# Patient Record
Sex: Female | Born: 1945 | Race: White | Hispanic: No | Smoking: Current every day smoker
Health system: Southern US, Community
[De-identification: ages and names within clinical notes are randomized; demographics above are authoritative.]

## PROBLEM LIST (undated history)

## (undated) DIAGNOSIS — R252 Cramp and spasm: Secondary | ICD-10-CM

## (undated) DIAGNOSIS — F32A Depression, unspecified: Secondary | ICD-10-CM

## (undated) DIAGNOSIS — E876 Hypokalemia: Secondary | ICD-10-CM

## (undated) DIAGNOSIS — E785 Hyperlipidemia, unspecified: Secondary | ICD-10-CM

## (undated) DIAGNOSIS — J449 Chronic obstructive pulmonary disease, unspecified: Secondary | ICD-10-CM

## (undated) DIAGNOSIS — L409 Psoriasis, unspecified: Secondary | ICD-10-CM

## (undated) DIAGNOSIS — F329 Major depressive disorder, single episode, unspecified: Secondary | ICD-10-CM

## (undated) DIAGNOSIS — I10 Essential (primary) hypertension: Secondary | ICD-10-CM

## (undated) DIAGNOSIS — K219 Gastro-esophageal reflux disease without esophagitis: Secondary | ICD-10-CM

## (undated) DIAGNOSIS — Z950 Presence of cardiac pacemaker: Secondary | ICD-10-CM

## (undated) HISTORY — DX: Gastro-esophageal reflux disease without esophagitis: K21.9

## (undated) HISTORY — PX: CATARACT EXTRACTION, BILATERAL: SHX1313

## (undated) HISTORY — DX: Cramp and spasm: R25.2

## (undated) HISTORY — PX: ABDOMINAL HYSTERECTOMY: SHX81

## (undated) HISTORY — PX: MASTOIDECTOMY: SHX711

## (undated) HISTORY — DX: Hyperlipidemia, unspecified: E78.5

## (undated) HISTORY — PX: BACK SURGERY: SHX140

## (undated) HISTORY — DX: Essential (primary) hypertension: I10

## (undated) HISTORY — DX: Hypokalemia: E87.6

## (undated) HISTORY — PX: APPENDECTOMY: SHX54

## (undated) HISTORY — PX: CHOLECYSTECTOMY: SHX55

## (undated) HISTORY — DX: Psoriasis, unspecified: L40.9

## (undated) HISTORY — DX: Chronic obstructive pulmonary disease, unspecified: J44.9

## (undated) HISTORY — PX: MASTECTOMY: SHX3

## (undated) HISTORY — DX: Depression, unspecified: F32.A

## (undated) HISTORY — DX: Major depressive disorder, single episode, unspecified: F32.9

## (undated) HISTORY — PX: OTHER SURGICAL HISTORY: SHX169

## (undated) HISTORY — PX: PARTIAL COLECTOMY: SHX5273

---

## 2014-05-06 DIAGNOSIS — J45909 Unspecified asthma, uncomplicated: Secondary | ICD-10-CM | POA: Insufficient documentation

## 2014-05-06 HISTORY — DX: Unspecified asthma, uncomplicated: J45.909

## 2014-09-20 DIAGNOSIS — D649 Anemia, unspecified: Secondary | ICD-10-CM

## 2014-09-20 DIAGNOSIS — E559 Vitamin D deficiency, unspecified: Secondary | ICD-10-CM | POA: Insufficient documentation

## 2014-09-20 DIAGNOSIS — E7800 Pure hypercholesterolemia, unspecified: Secondary | ICD-10-CM

## 2014-09-20 DIAGNOSIS — M1990 Unspecified osteoarthritis, unspecified site: Secondary | ICD-10-CM

## 2014-09-20 HISTORY — DX: Anemia, unspecified: D64.9

## 2014-09-20 HISTORY — DX: Vitamin D deficiency, unspecified: E55.9

## 2014-09-20 HISTORY — DX: Unspecified osteoarthritis, unspecified site: M19.90

## 2014-09-20 HISTORY — DX: Pure hypercholesterolemia, unspecified: E78.00

## 2016-10-25 DIAGNOSIS — H02831 Dermatochalasis of right upper eyelid: Secondary | ICD-10-CM | POA: Diagnosis not present

## 2016-10-25 DIAGNOSIS — H02834 Dermatochalasis of left upper eyelid: Secondary | ICD-10-CM | POA: Diagnosis not present

## 2016-12-26 DIAGNOSIS — Z1329 Encounter for screening for other suspected endocrine disorder: Secondary | ICD-10-CM | POA: Diagnosis not present

## 2016-12-26 DIAGNOSIS — E782 Mixed hyperlipidemia: Secondary | ICD-10-CM | POA: Diagnosis not present

## 2016-12-26 DIAGNOSIS — I10 Essential (primary) hypertension: Secondary | ICD-10-CM | POA: Diagnosis not present

## 2016-12-26 DIAGNOSIS — Z79899 Other long term (current) drug therapy: Secondary | ICD-10-CM | POA: Diagnosis not present

## 2016-12-31 DIAGNOSIS — H5340 Unspecified visual field defects: Secondary | ICD-10-CM | POA: Diagnosis not present

## 2016-12-31 DIAGNOSIS — H02831 Dermatochalasis of right upper eyelid: Secondary | ICD-10-CM | POA: Diagnosis not present

## 2016-12-31 DIAGNOSIS — H02834 Dermatochalasis of left upper eyelid: Secondary | ICD-10-CM | POA: Diagnosis not present

## 2017-01-02 DIAGNOSIS — F329 Major depressive disorder, single episode, unspecified: Secondary | ICD-10-CM | POA: Diagnosis not present

## 2017-01-02 DIAGNOSIS — Z87891 Personal history of nicotine dependence: Secondary | ICD-10-CM | POA: Diagnosis not present

## 2017-01-02 DIAGNOSIS — Z0001 Encounter for general adult medical examination with abnormal findings: Secondary | ICD-10-CM | POA: Diagnosis not present

## 2017-01-02 DIAGNOSIS — K219 Gastro-esophageal reflux disease without esophagitis: Secondary | ICD-10-CM | POA: Diagnosis not present

## 2017-01-02 DIAGNOSIS — I10 Essential (primary) hypertension: Secondary | ICD-10-CM | POA: Diagnosis not present

## 2017-01-02 DIAGNOSIS — E782 Mixed hyperlipidemia: Secondary | ICD-10-CM | POA: Diagnosis not present

## 2017-01-07 DIAGNOSIS — J439 Emphysema, unspecified: Secondary | ICD-10-CM | POA: Diagnosis not present

## 2017-01-07 DIAGNOSIS — Z87891 Personal history of nicotine dependence: Secondary | ICD-10-CM | POA: Diagnosis not present

## 2017-01-07 DIAGNOSIS — R911 Solitary pulmonary nodule: Secondary | ICD-10-CM | POA: Diagnosis not present

## 2017-02-07 DIAGNOSIS — M1711 Unilateral primary osteoarthritis, right knee: Secondary | ICD-10-CM | POA: Diagnosis not present

## 2017-03-11 DIAGNOSIS — M47896 Other spondylosis, lumbar region: Secondary | ICD-10-CM | POA: Diagnosis not present

## 2017-03-11 DIAGNOSIS — M545 Low back pain: Secondary | ICD-10-CM | POA: Diagnosis not present

## 2017-03-18 DIAGNOSIS — M545 Low back pain: Secondary | ICD-10-CM | POA: Diagnosis not present

## 2017-03-18 DIAGNOSIS — M47896 Other spondylosis, lumbar region: Secondary | ICD-10-CM | POA: Diagnosis not present

## 2017-04-01 DIAGNOSIS — M545 Low back pain: Secondary | ICD-10-CM | POA: Diagnosis not present

## 2017-04-01 DIAGNOSIS — M47896 Other spondylosis, lumbar region: Secondary | ICD-10-CM | POA: Diagnosis not present

## 2017-04-03 DIAGNOSIS — M47896 Other spondylosis, lumbar region: Secondary | ICD-10-CM | POA: Diagnosis not present

## 2017-04-03 DIAGNOSIS — M545 Low back pain: Secondary | ICD-10-CM | POA: Diagnosis not present

## 2017-04-08 DIAGNOSIS — M545 Low back pain: Secondary | ICD-10-CM | POA: Diagnosis not present

## 2017-04-08 DIAGNOSIS — M47896 Other spondylosis, lumbar region: Secondary | ICD-10-CM | POA: Diagnosis not present

## 2017-04-11 DIAGNOSIS — M47896 Other spondylosis, lumbar region: Secondary | ICD-10-CM | POA: Diagnosis not present

## 2017-04-11 DIAGNOSIS — M545 Low back pain: Secondary | ICD-10-CM | POA: Diagnosis not present

## 2017-04-29 DIAGNOSIS — E782 Mixed hyperlipidemia: Secondary | ICD-10-CM | POA: Diagnosis not present

## 2017-04-29 DIAGNOSIS — M4726 Other spondylosis with radiculopathy, lumbar region: Secondary | ICD-10-CM | POA: Diagnosis not present

## 2017-04-29 DIAGNOSIS — Z79899 Other long term (current) drug therapy: Secondary | ICD-10-CM | POA: Diagnosis not present

## 2017-04-29 DIAGNOSIS — I10 Essential (primary) hypertension: Secondary | ICD-10-CM | POA: Diagnosis not present

## 2017-05-05 DIAGNOSIS — M4726 Other spondylosis with radiculopathy, lumbar region: Secondary | ICD-10-CM | POA: Diagnosis not present

## 2017-05-06 DIAGNOSIS — E782 Mixed hyperlipidemia: Secondary | ICD-10-CM | POA: Diagnosis not present

## 2017-05-06 DIAGNOSIS — J449 Chronic obstructive pulmonary disease, unspecified: Secondary | ICD-10-CM | POA: Diagnosis not present

## 2017-05-06 DIAGNOSIS — I10 Essential (primary) hypertension: Secondary | ICD-10-CM | POA: Diagnosis not present

## 2017-05-06 DIAGNOSIS — F329 Major depressive disorder, single episode, unspecified: Secondary | ICD-10-CM | POA: Diagnosis not present

## 2017-05-06 DIAGNOSIS — L409 Psoriasis, unspecified: Secondary | ICD-10-CM | POA: Diagnosis not present

## 2017-05-08 DIAGNOSIS — M4726 Other spondylosis with radiculopathy, lumbar region: Secondary | ICD-10-CM | POA: Diagnosis not present

## 2017-05-08 DIAGNOSIS — M4696 Unspecified inflammatory spondylopathy, lumbar region: Secondary | ICD-10-CM | POA: Diagnosis not present

## 2017-05-22 DIAGNOSIS — M48061 Spinal stenosis, lumbar region without neurogenic claudication: Secondary | ICD-10-CM | POA: Diagnosis not present

## 2017-06-17 DIAGNOSIS — M48062 Spinal stenosis, lumbar region with neurogenic claudication: Secondary | ICD-10-CM | POA: Diagnosis not present

## 2017-09-08 DIAGNOSIS — I10 Essential (primary) hypertension: Secondary | ICD-10-CM | POA: Diagnosis not present

## 2017-09-08 DIAGNOSIS — Z79899 Other long term (current) drug therapy: Secondary | ICD-10-CM | POA: Diagnosis not present

## 2017-09-08 DIAGNOSIS — E782 Mixed hyperlipidemia: Secondary | ICD-10-CM | POA: Diagnosis not present

## 2017-09-18 DIAGNOSIS — L409 Psoriasis, unspecified: Secondary | ICD-10-CM | POA: Diagnosis not present

## 2017-09-18 DIAGNOSIS — K219 Gastro-esophageal reflux disease without esophagitis: Secondary | ICD-10-CM | POA: Diagnosis not present

## 2017-09-18 DIAGNOSIS — E782 Mixed hyperlipidemia: Secondary | ICD-10-CM | POA: Diagnosis not present

## 2017-09-18 DIAGNOSIS — F329 Major depressive disorder, single episode, unspecified: Secondary | ICD-10-CM | POA: Diagnosis not present

## 2017-09-18 DIAGNOSIS — Z634 Disappearance and death of family member: Secondary | ICD-10-CM | POA: Diagnosis not present

## 2017-11-24 DIAGNOSIS — M545 Low back pain: Secondary | ICD-10-CM | POA: Diagnosis not present

## 2017-12-10 DIAGNOSIS — L400 Psoriasis vulgaris: Secondary | ICD-10-CM | POA: Diagnosis not present

## 2017-12-10 DIAGNOSIS — L814 Other melanin hyperpigmentation: Secondary | ICD-10-CM | POA: Diagnosis not present

## 2018-01-07 DIAGNOSIS — J441 Chronic obstructive pulmonary disease with (acute) exacerbation: Secondary | ICD-10-CM | POA: Diagnosis not present

## 2018-05-18 DIAGNOSIS — Z23 Encounter for immunization: Secondary | ICD-10-CM | POA: Diagnosis not present

## 2018-05-18 DIAGNOSIS — F329 Major depressive disorder, single episode, unspecified: Secondary | ICD-10-CM | POA: Diagnosis not present

## 2018-05-18 DIAGNOSIS — G4700 Insomnia, unspecified: Secondary | ICD-10-CM | POA: Diagnosis not present

## 2018-05-18 DIAGNOSIS — K219 Gastro-esophageal reflux disease without esophagitis: Secondary | ICD-10-CM | POA: Diagnosis not present

## 2018-05-18 DIAGNOSIS — E785 Hyperlipidemia, unspecified: Secondary | ICD-10-CM | POA: Diagnosis not present

## 2018-06-18 DIAGNOSIS — G4700 Insomnia, unspecified: Secondary | ICD-10-CM | POA: Diagnosis not present

## 2018-06-18 DIAGNOSIS — F329 Major depressive disorder, single episode, unspecified: Secondary | ICD-10-CM | POA: Diagnosis not present

## 2018-06-18 DIAGNOSIS — R609 Edema, unspecified: Secondary | ICD-10-CM | POA: Diagnosis not present

## 2018-06-18 DIAGNOSIS — J449 Chronic obstructive pulmonary disease, unspecified: Secondary | ICD-10-CM | POA: Diagnosis not present

## 2018-06-24 DIAGNOSIS — H2703 Aphakia, bilateral: Secondary | ICD-10-CM | POA: Diagnosis not present

## 2018-06-29 DIAGNOSIS — I10 Essential (primary) hypertension: Secondary | ICD-10-CM | POA: Diagnosis not present

## 2018-06-29 DIAGNOSIS — Z6823 Body mass index (BMI) 23.0-23.9, adult: Secondary | ICD-10-CM | POA: Diagnosis not present

## 2018-06-29 DIAGNOSIS — K219 Gastro-esophageal reflux disease without esophagitis: Secondary | ICD-10-CM | POA: Diagnosis not present

## 2018-07-30 DIAGNOSIS — Z23 Encounter for immunization: Secondary | ICD-10-CM | POA: Diagnosis not present

## 2018-07-30 DIAGNOSIS — Z0000 Encounter for general adult medical examination without abnormal findings: Secondary | ICD-10-CM | POA: Diagnosis not present

## 2018-07-30 DIAGNOSIS — Z139 Encounter for screening, unspecified: Secondary | ICD-10-CM | POA: Diagnosis not present

## 2018-07-30 DIAGNOSIS — Z1339 Encounter for screening examination for other mental health and behavioral disorders: Secondary | ICD-10-CM | POA: Diagnosis not present

## 2018-07-30 DIAGNOSIS — I10 Essential (primary) hypertension: Secondary | ICD-10-CM | POA: Diagnosis not present

## 2018-07-30 DIAGNOSIS — K219 Gastro-esophageal reflux disease without esophagitis: Secondary | ICD-10-CM | POA: Diagnosis not present

## 2018-07-30 DIAGNOSIS — Z9181 History of falling: Secondary | ICD-10-CM | POA: Diagnosis not present

## 2018-07-30 DIAGNOSIS — E785 Hyperlipidemia, unspecified: Secondary | ICD-10-CM | POA: Diagnosis not present

## 2018-07-30 DIAGNOSIS — F329 Major depressive disorder, single episode, unspecified: Secondary | ICD-10-CM | POA: Diagnosis not present

## 2018-07-30 DIAGNOSIS — Z1331 Encounter for screening for depression: Secondary | ICD-10-CM | POA: Diagnosis not present

## 2018-07-30 DIAGNOSIS — Z79899 Other long term (current) drug therapy: Secondary | ICD-10-CM | POA: Diagnosis not present

## 2018-08-11 DIAGNOSIS — H9190 Unspecified hearing loss, unspecified ear: Secondary | ICD-10-CM | POA: Diagnosis not present

## 2018-08-11 DIAGNOSIS — Z6823 Body mass index (BMI) 23.0-23.9, adult: Secondary | ICD-10-CM | POA: Diagnosis not present

## 2018-08-11 DIAGNOSIS — F329 Major depressive disorder, single episode, unspecified: Secondary | ICD-10-CM | POA: Diagnosis not present

## 2018-08-11 DIAGNOSIS — R251 Tremor, unspecified: Secondary | ICD-10-CM | POA: Diagnosis not present

## 2018-08-18 ENCOUNTER — Encounter: Payer: Self-pay | Admitting: Neurology

## 2018-08-18 ENCOUNTER — Ambulatory Visit (INDEPENDENT_AMBULATORY_CARE_PROVIDER_SITE_OTHER): Payer: Medicare Other | Admitting: Neurology

## 2018-08-18 VITALS — BP 159/73 | HR 62 | Ht 67.0 in | Wt 152.0 lb

## 2018-08-18 DIAGNOSIS — F439 Reaction to severe stress, unspecified: Secondary | ICD-10-CM

## 2018-08-18 DIAGNOSIS — G250 Essential tremor: Secondary | ICD-10-CM

## 2018-08-18 NOTE — Progress Notes (Signed)
Subjective:    Patient ID: Tina George is a 72 y.o. female.  HPI     Star Age, MD, PhD [facility] [address] [place], [place] [address]  Dear Alvis Lemmings,   I saw your patient, Tina George, upon your kind request in my neurologic clinic today for initial consultation of her tremor. The patient is accompanied by her daughter today. As you know, Tina George is a 72 year old right-handed woman with an underlying medical history of hypertension, hyperlipidemia, reflux disease, COPD/asthma, depression, psoriasis and history of muscle cramps, who reports a bilateral hand tremors as well as head tremor of approximately 1 years duration. I reviewed your office note from 08/11/2018, which you kindly included. Of note, she is on Symbicort. She is also on albuterol. She continues to smoke, about  ppd. She has been on Lexapro which was recently changed to Paxil. She has been on Paxil for about a week. Of note, she takes gabapentin at night for sleep. She does report a family history of tremor in one of her sisters and also maternal grandmother. She is the youngest of 65 total siblings, all of her siblings are much older than her, the sibling next to her is 55 years older, oldest sibling was 82 years older. Another sister has end-stage cancer. She has had a lot of stress. She lost her husband about a year ago and moved away from her [facility] home about 6 months ago to be closer to her children. She has her own home here, she lives alone. Her daughter reports that she has not been taking good care of herself and that she does not eat very well, does not drink fluids very well, she does not rest well. Patient endorses a lot of stress and anxiety and does become tearful during our appointment today. She does not utilize alcohol, drinks caffeine in the form of coffee, about 2 cups per day and soda, maybe 1 per day on average. She feels that her tremor has become worse.  She has a history of asthma which has been fairly brittle as I understand. Of note, she has had issues with her balance, she feels off-balance at times, has had some hearing loss and is supposed to have her hearing tested.  Her Past Medical History Is Significant For: Past Medical History:  Diagnosis Date  . COPD (chronic obstructive pulmonary disease) ([place])   . Depression   . Dyslipidemia   . GERD (gastroesophageal reflux disease)   . High blood pressure   . Hypokalemia   . Muscle cramps   . Psoriasis     Her Past Surgical History Is Significant For:  Her Family History Is Significant For: Family History  Problem Relation Age of Onset  . Pancreatic cancer Mother   . Hypertension Father   . Heart disease Father   . Pancreatic cancer Sister   . Pancreatic cancer Brother     Her Social History Is Significant For: Social History   Socioeconomic History  . Marital status: Widowed    Spouse name: Not on file  . Number of children: Not on file  . Years of education: Not on file  . Highest education level: Not on file  Occupational History  . Not on file  Social Needs  . Financial resource strain: Not on file  . Food insecurity:    Worry: Not on file    Inability: Not on file  . Transportation needs:  Medical: Not on file    Non-medical: Not on file  Tobacco Use  . Smoking status: Current Every Day Smoker    Packs/day: 0.50  . Smokeless tobacco: Never Used  Substance and Sexual Activity  . Alcohol use: Never    Frequency: Never  . Drug use: Never  . Sexual activity: Not on file  Lifestyle  . Physical activity:    Days per week: Not on file    Minutes per session: Not on file  . Stress: Not on file  Relationships  . Social connections:    Talks on phone: Not on file    Gets together: Not on file    Attends religious service: Not on file    Active member of club or organization: Not on file    Attends meetings of clubs or organizations: Not on file     Relationship status: Not on file  Other Topics Concern  . Not on file  Social History Narrative  . Not on file    Her Allergies Are:  Allergies  Allergen Reactions  . Aspirin   . Codeine   . Levaquin [Levofloxacin In D5w]   :   Her Current Medications Are:  Outpatient Encounter Medications as of 08/18/2018  Medication Sig  . albuterol (PROVENTIL HFA;VENTOLIN HFA) 108 (90 Base) MCG/ACT inhaler   . amLODipine (NORVASC) 2.5 MG tablet Take 2.5 mg by mouth daily.  . budesonide-formoterol (SYMBICORT) 160-4.5 MCG/ACT inhaler Inhale 2 puffs into the lungs 2 (two) times daily.  . furosemide (LASIX) 20 MG tablet Take 20 mg by mouth daily.  Marland Kitchen gabapentin (NEURONTIN) 300 MG capsule Take 300 mg by mouth.  Marland Kitchen ibuprofen (ADVIL,MOTRIN) 200 MG tablet Take 200 mg by mouth.  . pantoprazole (PROTONIX) 40 MG tablet Take 40 mg by mouth daily.  Marland Kitchen PARoxetine (PAXIL) 20 MG tablet Take 20 mg by mouth daily.  . Potassium 99 MG TABS Take 2 tablets by mouth daily.  . ranitidine (ZANTAC) 150 MG tablet Take 150 mg by mouth 2 (two) times daily.  . rosuvastatin (CRESTOR) 10 MG tablet Take 10 mg by mouth daily.  . [DISCONTINUED] escitalopram (LEXAPRO) 20 MG tablet Take 20 mg by mouth daily.   No facility-administered encounter medications on file as of 08/18/2018.   : Review of Systems:  Out of a complete 14 point review of systems, all are reviewed and negative with the exception of these symptoms as listed below:  Review of Systems  Neurological:       Pt presents today to discuss her tremors. Pt's tremors are "all over" and have been present since last year and have gotten progressively worse. Pt is right handed.    Objective:  Neurological Exam  Physical Exam Physical Examination:   Vitals:   08/18/18 1443  BP: (!) 159/73  Pulse: 62   General Examination: The patient is a very pleasant 72 y.o. female in no acute distress. She appears well-developed and well-nourished and well groomed. Anxious  appearing, near tears at times.   HEENT: Normocephalic, atraumatic, pupils are equal, round and reactive to light and accommodation. Extraocular tracking is good without limitation to gaze excursion or nystagmus noted. Normal smooth pursuit is noted. Hearing is grossly intact. Face is symmetric with normal facial animation and normal facial sensation. Speech is clear with no dysarthria noted. There is no hypophonia. She has a fairly consistent side-to-side head tremor. She has a slight voice tremor. Airway examination shows moderate mouth dryness, adequate dental hygiene with dentures in  place. Tongue protrudes centrally and palate elevates symmetrically, no orofacial dyskinesias.  Chest: Clear to auscultation without wheezing, rhonchi or crackles noted.  Heart: S1+S2+0, regular and normal without murmurs, rubs or gallops noted.   Abdomen: Soft, non-tender and non-distended with normal bowel sounds appreciated on auscultation.  Extremities: There is trace edema in the distal lower extremities bilaterally.  Skin: Warm and dry without trophic changes noted.   Musculoskeletal: exam reveals no obvious joint deformities, tenderness or joint swelling or erythema with the exception of right knee pain noted.  Neurologically:  Mental status: The patient is awake, alert and oriented in all 4 spheres. Her immediate and remote memory, attention, language skills and fund of knowledge are appropriate. There is no evidence of aphasia, agnosia, apraxia or anomia. Speech is clear with normal prosody and enunciation. Thought process is linear. Mood is constricted and affect is blunted.  Cranial nerves II - XII are as described above under HEENT exam. In addition: shoulder shrug is normal with equal shoulder height noted. Motor exam: Normal bulk, strength and tone is noted. There is no resting tremor or rebound.  On 08/18/2018: on Archimedes spiral drawing she has moderate trembling with her left hand which is her  nondominant hand and mild trembling with the right hand. Handwriting with her right hand is tremulous, slightly difficult to read, not micrographic. She has a mild to moderate postural tremor, mild action tremor in both upper extremities, no lower extremity tremor. Romberg is not tested due to safety concerns. Reflexes are 2+ throughout. Fine motor skills and coordination: intact with normal finger taps, normal hand movements, normal rapid alternating patting, normal foot taps and normal foot agility.  Cerebellar testing: No dysmetria or intention tremor on finger to nose testing. Heel to shin is unremarkable bilaterally. There is no truncal or gait ataxia.  Sensory exam: intact to light touch in the upper and lower extremities.  Gait, station and balance: She stands with mild difficulty, she has knee pain. She walks with a slight limp on the right side of slowly and cautiously. Her balance is mildly impaired. She does not have a walking aid. She has no shuffling, preserved arm swing is noted bilaterally.  Assessment and Plan:   In summary, Tina George is a very pleasant 72 y.o.-year old female with an underlying medical history of hypertension, hyperlipidemia, reflux disease, COPD/asthma, depression, psoriasis and history of muscle cramps, who presents for evaluation of her tremor. She has a head tremor and bilateral upper extremity hand tremor. 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 She has no evidence of parkinsonism and is reassured in that regard. Unfortunately, treatment options will be limited at this point. I would favor that she work on her stress reduction in anxiety management first. She had a recent change in her antidepressant medication. Her daughter reports that she has had impaired kidney function. She also is concerned about her nutrition and suboptimal hydration and patient has also not been resting well. We talked about the importance of taking  care of nutrition, hydration and resting enough as sleep deprivation, dehydration and blood sugar fluctuations can all be triggers for tremors. In addition, stress and anxiety can certainly exacerbate tremors temporarily. I would not recommend symptomatically treatment at this point. She may be a candidate for a trial of Mysoline down the Road but this would have to be done with caution as she reports balance issues. Propranolol may exacerbate her asthma and/or her depression and may  not be a good choice for her. I would like to proceed with a brain MRI without contrast, she may not have good enough kidney function to take contrast and therefore we will do a noncontrasted brain MRI, they are requesting to do this in [place]. We will call her with her MRI results. For now, I suggested as needed follow-up. I answered all their questions today and the patient and her daughter were in agreement. Thank you very much for allowing me to participate in the care of this nice patient. If I can be of any further assistance to you please do not hesitate to call me at [phone].  Sincerely,   Star Age, MD, PhD

## 2018-08-18 NOTE — Patient Instructions (Signed)
You have a hand tremor and head tremor, also a tremor of both hands. You have a family history of tremor as well. 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   I do not see any signs or symptoms of parkinson's like disease or what we call parkinsonism.   For your tremor, I would not recommend any new medication for fear of side effects (especially sleepiness) or medication interactions, especially in light of balance issues and asthma.   I will request a brain scan, called MRI and call you with the test results. We will have to schedule you for this on a separate date. This test requires authorization from your insurance, and we will take care of the insurance process. We will do this without contrast, as you have had some kidney impairment.   Please remember, that any kind of tremor may be exacerbated by anxiety, anger, nervousness, excitement, dehydration, sleep deprivation, by caffeine, and low blood sugar values or blood sugar fluctuations. Some medications can exacerbate tremors, this includes antidepressant medications.   Please work on Child psychotherapist, talk to [place] about counseling.

## 2018-08-19 ENCOUNTER — Telehealth: Payer: Self-pay | Admitting: Neurology

## 2018-08-19 NOTE — Telephone Encounter (Signed)
Medicare/[employer] supp not Josem Kaufmann pt is scheduled for 08/26/18 arrival time is 2:45 pm pt is aware. Faxed order

## 2018-08-26 ENCOUNTER — Encounter: Payer: Self-pay | Admitting: Neurology

## 2018-08-26 ENCOUNTER — Telehealth: Payer: Self-pay

## 2018-08-26 DIAGNOSIS — G250 Essential tremor: Secondary | ICD-10-CM | POA: Diagnosis not present

## 2018-08-26 DIAGNOSIS — R251 Tremor, unspecified: Secondary | ICD-10-CM | POA: Diagnosis not present

## 2018-08-26 DIAGNOSIS — I6782 Cerebral ischemia: Secondary | ICD-10-CM | POA: Diagnosis not present

## 2018-08-26 NOTE — Telephone Encounter (Signed)
Received MRI results from [facility].

## 2018-08-27 NOTE — Telephone Encounter (Signed)
Please call patient regarding the recent brain MRI: The brain scan without contrast showed a normal structure of the brain and no significant volume loss or what we call atrophy. There were changes in the deeper structures of the brain, which we call white matter changes or microvascular changes. These were reported as mild in Her case. These are tiny white spots, that occur with time and are seen in a variety of conditions, including with normal aging, chronic hypertension, chronic headaches, especially migraine HAs, chronic diabetes, chronic hyperlipidemia. These are not strokes and no mass or lesion were seen which is reassuring. Again, there were no acute findings, such as a stroke, or mass or blood products. No further action is required on this test at this time, other than re-enforcing the importance of good blood pressure control, good cholesterol control, good blood sugar control, and weight management.  She can FU prn, as discussed.

## 2018-08-27 NOTE — Telephone Encounter (Signed)
See below

## 2018-08-28 NOTE — Telephone Encounter (Signed)
I called pt and discussed her MRI results. Pt was very appreciative of the results and will follow up PRN. Pt verbalized understanding of results. Pt had no questions at this time but was encouraged to call back if questions arise.

## 2018-09-01 DIAGNOSIS — F329 Major depressive disorder, single episode, unspecified: Secondary | ICD-10-CM | POA: Diagnosis not present

## 2018-09-01 DIAGNOSIS — Z6823 Body mass index (BMI) 23.0-23.9, adult: Secondary | ICD-10-CM | POA: Diagnosis not present

## 2018-09-01 DIAGNOSIS — M25512 Pain in left shoulder: Secondary | ICD-10-CM | POA: Diagnosis not present

## 2018-09-19 DIAGNOSIS — J411 Mucopurulent chronic bronchitis: Secondary | ICD-10-CM | POA: Diagnosis not present

## 2018-11-02 DIAGNOSIS — I10 Essential (primary) hypertension: Secondary | ICD-10-CM | POA: Diagnosis not present

## 2018-11-02 DIAGNOSIS — J449 Chronic obstructive pulmonary disease, unspecified: Secondary | ICD-10-CM | POA: Diagnosis not present

## 2018-11-02 DIAGNOSIS — Z79899 Other long term (current) drug therapy: Secondary | ICD-10-CM | POA: Diagnosis not present

## 2018-11-02 DIAGNOSIS — E785 Hyperlipidemia, unspecified: Secondary | ICD-10-CM | POA: Diagnosis not present

## 2018-11-02 DIAGNOSIS — R062 Wheezing: Secondary | ICD-10-CM | POA: Diagnosis not present

## 2018-11-30 DIAGNOSIS — Z13820 Encounter for screening for osteoporosis: Secondary | ICD-10-CM | POA: Diagnosis not present

## 2018-11-30 DIAGNOSIS — E2839 Other primary ovarian failure: Secondary | ICD-10-CM | POA: Diagnosis not present

## 2018-12-16 DIAGNOSIS — G250 Essential tremor: Secondary | ICD-10-CM | POA: Diagnosis not present

## 2018-12-16 DIAGNOSIS — J449 Chronic obstructive pulmonary disease, unspecified: Secondary | ICD-10-CM | POA: Diagnosis not present

## 2018-12-16 DIAGNOSIS — Z6823 Body mass index (BMI) 23.0-23.9, adult: Secondary | ICD-10-CM | POA: Diagnosis not present

## 2018-12-16 DIAGNOSIS — R42 Dizziness and giddiness: Secondary | ICD-10-CM | POA: Diagnosis not present

## 2019-01-08 DIAGNOSIS — Z6823 Body mass index (BMI) 23.0-23.9, adult: Secondary | ICD-10-CM | POA: Diagnosis not present

## 2019-01-08 DIAGNOSIS — M25552 Pain in left hip: Secondary | ICD-10-CM | POA: Diagnosis not present

## 2019-01-29 DIAGNOSIS — Z20828 Contact with and (suspected) exposure to other viral communicable diseases: Secondary | ICD-10-CM | POA: Diagnosis not present

## 2019-02-08 DIAGNOSIS — S6992XA Unspecified injury of left wrist, hand and finger(s), initial encounter: Secondary | ICD-10-CM | POA: Diagnosis not present

## 2019-02-08 DIAGNOSIS — K5900 Constipation, unspecified: Secondary | ICD-10-CM | POA: Diagnosis not present

## 2019-02-09 DIAGNOSIS — S52532A Colles' fracture of left radius, initial encounter for closed fracture: Secondary | ICD-10-CM | POA: Diagnosis not present

## 2019-02-16 DIAGNOSIS — S52532A Colles' fracture of left radius, initial encounter for closed fracture: Secondary | ICD-10-CM | POA: Diagnosis not present

## 2019-02-22 DIAGNOSIS — S52532D Colles' fracture of left radius, subsequent encounter for closed fracture with routine healing: Secondary | ICD-10-CM | POA: Diagnosis not present

## 2019-03-05 DIAGNOSIS — I10 Essential (primary) hypertension: Secondary | ICD-10-CM | POA: Diagnosis not present

## 2019-03-05 DIAGNOSIS — Z79899 Other long term (current) drug therapy: Secondary | ICD-10-CM | POA: Diagnosis not present

## 2019-03-05 DIAGNOSIS — F339 Major depressive disorder, recurrent, unspecified: Secondary | ICD-10-CM | POA: Diagnosis not present

## 2019-03-05 DIAGNOSIS — J449 Chronic obstructive pulmonary disease, unspecified: Secondary | ICD-10-CM | POA: Diagnosis not present

## 2019-03-05 DIAGNOSIS — E785 Hyperlipidemia, unspecified: Secondary | ICD-10-CM | POA: Diagnosis not present

## 2019-03-24 DIAGNOSIS — M7062 Trochanteric bursitis, left hip: Secondary | ICD-10-CM | POA: Diagnosis not present

## 2019-03-24 DIAGNOSIS — S52532D Colles' fracture of left radius, subsequent encounter for closed fracture with routine healing: Secondary | ICD-10-CM | POA: Diagnosis not present

## 2019-04-09 DIAGNOSIS — B370 Candidal stomatitis: Secondary | ICD-10-CM | POA: Diagnosis not present

## 2019-04-09 DIAGNOSIS — R252 Cramp and spasm: Secondary | ICD-10-CM | POA: Diagnosis not present

## 2019-04-09 DIAGNOSIS — J449 Chronic obstructive pulmonary disease, unspecified: Secondary | ICD-10-CM | POA: Diagnosis not present

## 2019-04-13 DIAGNOSIS — R252 Cramp and spasm: Secondary | ICD-10-CM | POA: Diagnosis not present

## 2019-04-13 DIAGNOSIS — J449 Chronic obstructive pulmonary disease, unspecified: Secondary | ICD-10-CM | POA: Diagnosis not present

## 2019-04-13 DIAGNOSIS — Z6823 Body mass index (BMI) 23.0-23.9, adult: Secondary | ICD-10-CM | POA: Diagnosis not present

## 2019-04-13 DIAGNOSIS — I10 Essential (primary) hypertension: Secondary | ICD-10-CM | POA: Diagnosis not present

## 2019-04-20 DIAGNOSIS — R252 Cramp and spasm: Secondary | ICD-10-CM | POA: Diagnosis not present

## 2019-04-20 DIAGNOSIS — Z6839 Body mass index (BMI) 39.0-39.9, adult: Secondary | ICD-10-CM | POA: Diagnosis not present

## 2019-04-20 DIAGNOSIS — Z6836 Body mass index (BMI) 36.0-36.9, adult: Secondary | ICD-10-CM | POA: Diagnosis not present

## 2019-04-20 DIAGNOSIS — R635 Abnormal weight gain: Secondary | ICD-10-CM | POA: Diagnosis not present

## 2019-04-20 DIAGNOSIS — R5382 Chronic fatigue, unspecified: Secondary | ICD-10-CM | POA: Diagnosis not present

## 2019-04-21 DIAGNOSIS — S52532D Colles' fracture of left radius, subsequent encounter for closed fracture with routine healing: Secondary | ICD-10-CM | POA: Diagnosis not present

## 2019-04-28 DIAGNOSIS — H9193 Unspecified hearing loss, bilateral: Secondary | ICD-10-CM | POA: Diagnosis not present

## 2019-04-28 DIAGNOSIS — M159 Polyosteoarthritis, unspecified: Secondary | ICD-10-CM | POA: Diagnosis not present

## 2019-04-28 DIAGNOSIS — R945 Abnormal results of liver function studies: Secondary | ICD-10-CM | POA: Diagnosis not present

## 2019-04-28 DIAGNOSIS — J449 Chronic obstructive pulmonary disease, unspecified: Secondary | ICD-10-CM | POA: Diagnosis not present

## 2019-04-28 DIAGNOSIS — R7989 Other specified abnormal findings of blood chemistry: Secondary | ICD-10-CM | POA: Diagnosis not present

## 2019-05-05 ENCOUNTER — Ambulatory Visit (INDEPENDENT_AMBULATORY_CARE_PROVIDER_SITE_OTHER): Payer: Medicare Other | Admitting: Neurology

## 2019-05-05 ENCOUNTER — Encounter: Payer: Self-pay | Admitting: Neurology

## 2019-05-05 ENCOUNTER — Other Ambulatory Visit: Payer: Self-pay

## 2019-05-05 VITALS — BP 133/78 | HR 68 | Ht 67.0 in | Wt 145.0 lb

## 2019-05-05 DIAGNOSIS — G250 Essential tremor: Secondary | ICD-10-CM | POA: Diagnosis not present

## 2019-05-05 DIAGNOSIS — G4762 Sleep related leg cramps: Secondary | ICD-10-CM

## 2019-05-05 DIAGNOSIS — R252 Cramp and spasm: Secondary | ICD-10-CM | POA: Diagnosis not present

## 2019-05-05 NOTE — Progress Notes (Signed)
Subjective:    Patient ID: Tina George is a 73 y.o. female.  HPI     Star Age, MD, PhD [facility] [address], [address] [place], [place] [address]  Dear Dr. Truman Hayward,   I saw your patient, Tina George, upon your kind request to my neurologic clinic today for initial consultation of her muscle cramping.  The patient is accompanied by her daughter today.  As you know, Tina George is a 73 year old right-handed woman with an underlying medical history of COPD, psoriasis, hypokalemia, hypertension, reflux disease, hyperlipidemia, and depression, who reports new onset muscle cramping affecting her lower extremities bilaterally for the past 6 weeks.  She has trouble sleeping at night because of this, her cramps primarily occur when she tries to lay down flat at night.  She denies any urges to move her legs or any need to move her feet or legs.  The cramps have been painful.  I evaluated her last year for essential tremor.  I did not recommend any new medications for fear of side effects at the time.  She was significantly depressed at the time.  I recommended a brain MRI.  She had a MRI head without contrast on 08/20/2018 which showed no specific or reversible finding to explain tremor.  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  We called her with her MRI results. I reviewed your office note from 04/28/2019.  Of note, she is on ropinirole 3 mg at bedtime, she is also on Neurontin 400 mg at bedtime.  You referred her to ENT for hearing loss.  She had blood work recently through your office, liver panel showed alk phos elevated at 192, AST 27, ALT 116 which is elevated.  She had hepatitis panel which was negative. She hydrates with water, estimates that she drinks 8 large glasses of water, by her description 16 ounce large cups.  She drinks caffeine and limitation, she does not drink any alcohol.  She continues to smoke.  Her COPD has been  more brittle lately.  She does need to use her inhaler regularly.  After she uses her inhaler she has more tremor.  She has ongoing issues with her hand tremor.  She has been on ropinirole at a lower dose, currently 1 mg at night and gabapentin was also reduced to 300 mg at bedtime.  She reports that neither 1 of these medications have been helping.  In fact, her balance has been worse.  She has fallen and injured her wrist.  She also injured her right hip.  She bruises easily but this is not a new problem.  Her weight loss is new.  She has continued to lose weight despite a good appetite.  She has an appointment with you coming up tomorrow. She has had no changes in her medications otherwise, has been on Crestor for at least a year. Her liver enzymes Have been elevated.  Previously:  08/18/2018: 73 year old right-handed woman with an underlying medical history of hypertension, hyperlipidemia, reflux disease, COPD/asthma, depression, psoriasis and history of muscle cramps, who reports a bilateral hand tremors as well as head tremor of approximately 1 years duration. I reviewed your office note from 08/11/2018, which you kindly included. Of note, she is on Symbicort. She is also on albuterol. She continues to smoke, about  ppd. She has been on Lexapro which was recently changed to Paxil. She has been on Paxil for about a week. Of note,  she takes gabapentin at night for sleep. She does report a family history of tremor in one of her sisters and also maternal grandmother. She is the youngest of 38 total siblings, all of her siblings are much older than her, the sibling next to her is 56 years older, oldest sibling was 70 years older. Another sister has end-stage cancer. She has had a lot of stress. She lost her husband about a year ago and moved away from her [facility] home about 6 months ago to be closer to her children. She has her own home here, she lives alone. Her daughter reports that she has not been taking  good care of herself and that she does not eat very well, does not drink fluids very well, she does not rest well. Patient endorses a lot of stress and anxiety and does become tearful during our appointment today. She does not utilize alcohol, drinks caffeine in the form of coffee, about 2 cups per day and soda, maybe 1 per day on average. She feels that her tremor has become worse. She has a history of asthma which has been fairly brittle as I understand. Of note, she has had issues with her balance, she feels off-balance at times, has had some hearing loss and is supposed to have her hearing tested.  Her Past Medical History Is Significant For: Past Medical History:  Diagnosis Date  . COPD (chronic obstructive pulmonary disease) ([place])   . Depression   . Dyslipidemia   . GERD (gastroesophageal reflux disease)   . High blood pressure   . Hypokalemia   . Muscle cramps   . Psoriasis     Her Past Surgical History Is Significant For:   Her Family History Is Significant For: Family History  Problem Relation Age of Onset  . Pancreatic cancer Mother   . Hypertension Father   . Heart disease Father   . Pancreatic cancer Sister   . Pancreatic cancer Brother     Her Social History Is Significant For: Social History   Socioeconomic History  . Marital status: Widowed    Spouse name: Not on file  . Number of children: Not on file  . Years of education: Not on file  . Highest education level: Not on file  Occupational History  . Not on file  Social Needs  . Financial resource strain: Not on file  . Food insecurity    Worry: Not on file    Inability: Not on file  . Transportation needs    Medical: Not on file    Non-medical: Not on file  Tobacco Use  . Smoking status: Current Every Day Smoker    Packs/day: 0.50  . Smokeless tobacco: Never Used  Substance and Sexual Activity  . Alcohol use: Never    Frequency: Never  . Drug use: Never  . Sexual activity: Not on file  Lifestyle   . Physical activity    Days per week: Not on file    Minutes per session: Not on file  . Stress: Not on file  Relationships  . Social Herbalist on phone: Not on file    Gets together: Not on file    Attends religious service: Not on file    Active member of club or organization: Not on file    Attends meetings of clubs or organizations: Not on file    Relationship status: Not on file  Other Topics Concern  . Not on file  Social  History Narrative  . Not on file    Her Allergies Are:  Allergies  Allergen Reactions  . Aspirin   . Codeine   . Levaquin [Levofloxacin In D5w]   :   Her Current Medications Are:  Outpatient Encounter Medications as of 05/05/2019  Medication Sig  . albuterol (PROVENTIL HFA;VENTOLIN HFA) 108 (90 Base) MCG/ACT inhaler   . amLODipine (NORVASC) 5 MG tablet Take 5 mg by mouth daily.  . budesonide-formoterol (SYMBICORT) 160-4.5 MCG/ACT inhaler Inhale 2 puffs into the lungs 2 (two) times daily.  . famotidine (PEPCID) 20 MG tablet Take 20 mg by mouth 2 (two) times daily.  . furosemide (LASIX) 20 MG tablet Take 20 mg by mouth daily.  Marland Kitchen gabapentin (NEURONTIN) 300 MG capsule Take 300 mg by mouth at bedtime.  Marland Kitchen ibuprofen (ADVIL,MOTRIN) 200 MG tablet Take 200 mg by mouth.  Marland Kitchen PARoxetine (PAXIL) 30 MG tablet Take 30 mg by mouth daily.  . Potassium 99 MG TABS Take 2 tablets by mouth daily.  Marland Kitchen rOPINIRole (REQUIP) 1 MG tablet Take 1.5 mg by mouth at bedtime.  . rosuvastatin (CRESTOR) 10 MG tablet Take 10 mg by mouth daily.  . [DISCONTINUED] gabapentin (NEURONTIN) 400 MG capsule Take 400 mg by mouth at bedtime.  . [DISCONTINUED] pantoprazole (PROTONIX) 40 MG tablet Take 40 mg by mouth daily.  . [DISCONTINUED] ranitidine (ZANTAC) 150 MG tablet Take 150 mg by mouth 2 (two) times daily.  . [DISCONTINUED] rOPINIRole (REQUIP) 3 MG tablet Take 3 mg by mouth at bedtime.   No facility-administered encounter medications on file as of 05/05/2019.   :   Review of  Systems:  Out of a complete 14 point review of systems, all are reviewed and negative with the exception of these symptoms as listed below:  Review of Systems  Neurological:       Pt presents today to discuss her leg cramping. Gabapentin and requip are not helping.    Objective:  Neurological Exam  Physical Exam Physical Examination:   Vitals:   05/05/19 1509  BP: 133/78  Pulse: 68    General Examination: The patient is a very pleasant 73 y.o. female in no acute distress. She appears well-developed and well-nourished and well groomed.   HEENT: Normocephalic, atraumatic, pupils are equal, round and reactive to light and accommodation. Extraocular tracking is good without limitation to gaze excursion or nystagmus noted. Normal smooth pursuit is noted. Hearing is grossly intact. Face is symmetric with normal facial animation and normal facial sensation. Speech is clear with no dysarthria noted. There is no hypophonia. She has a fairly consistent side-to-side head tremor. She has a slight voice tremor. Airway examination shows moderate mouth dryness, adequate dental hygiene with dentures in place. Tongue protrudes centrally and palate elevates symmetrically, no orofacial dyskinesias.  Chest: Clear to auscultation without wheezing, rhonchi or crackles noted.  Heart: S1+S2+0, regular and normal without murmurs, rubs or gallops noted.   Abdomen: Soft, non-tender and non-distended with normal bowel sounds appreciated on auscultation.  Extremities: There is trace edema in the distal lower extremities bilaterally.  Skin: Warm and dry without trophic changes noted.   Musculoskeletal: exam reveals no obvious joint deformities, tenderness or joint swelling or erythema with the exception of right knee pain noted.  Neurologically:  Mental status: The patient is awake, alert and oriented in all 4 spheres. Her immediate and remote memory, attention, language skills and fund of knowledge  are appropriate. There is no evidence of aphasia, agnosia, apraxia or anomia. Speech  is clear with normal prosody and enunciation. Thought process is linear. Mood is normal and affect is normal.  Cranial nerves II - XII are as described above under HEENT exam.  Motor exam: Normal bulk, strength and tone is noted. There is no resting tremor or rebound.  She has a mild to moderate postural tremor in both UEs, mild action tremor in both upper extremities, no lower extremity tremor. Romberg is not tested due to safety concerns. Reflexes are 1+ throughout. Fine motor skills and coordination: intact with normal finger taps, normal hand movements, normal rapid alternating patting, normal foot taps and normal foot agility.  Cerebellar testing: No dysmetria or intention tremor on finger to nose testing. Heel to shin is unremarkable bilaterally. There is no truncal or gait ataxia.  Sensory exam: intact to light touch in the upper and lower extremities.  Gait, station and balance: She stands with mild difficulty. She walks slowly and cautiously. Her balance is mildly impaired. She does not have a walking aid. She has no shuffling, preserved arm swing is noted bilaterally. Insecure with turns.  Assessment and Plan:   In summary, Tina George is a very pleasant 73 year old female with an underlying medical history of hypertension, hyperlipidemia, reflux disease, COPD/asthma, depression, psoriasis, cramps and tremor, Who presents for evaluation of her muscle cramps, these have been severe over the past 6 weeks without any obvious trigger or change in lifestyle or change in medication.  Her history is not telltale for restless leg syndrome.  She has not noticed any benefits from taking ropinirole and/or gabapentin.  If anything, her balance has been worse. She is encouraged to talk to you about potentially coming off of these medications.I am not sure how to explain her muscle cramping, she has no obvious cramping  on examination today.  We will pursued with further evaluation in the form of blood work.  She is discouraged from over hydrating with water.  She is advised to talk to you about her weight loss and work-up for this, she is strongly encouraged to quit smoking. She may try over-the-counter tonic water which can help with cramping sometimes, she is encouraged to try a small amount in the evening. Her tremor seems to be stable.  She has no evidence of parkinsonism, she has impaired balance.  I did not suggest a trial of muscle relaxers because of her impaired balance and for fear of side effects including sedation. We will schedule an EMG and nerve conduction velocity test and call her with the result to look for any obvious neuromuscular cause of her cramping. I suggested as needed follow-up. I answered all their questions today and the patient and her daughter were in agreement.  Thank you very much for allowing me to participate in the care of this nice patient. If I can be of any further assistance to you please do not hesitate to call me at [phone].  Sincerely,   Star Age, MD, PhD

## 2019-05-05 NOTE — Patient Instructions (Signed)
We will try to get to the bottom of your muscle cramping, I will do blood work today and we will call you with the results.  We will also do an EMG and nerve conduction velocity test, which is an electrical nerve and muscle test, which we will schedule. We will call you with the results. Please talk to your primary care physician about work-up for weight loss.  Please try to stop smoking.  You could try little bit of tonic water at night before bedtime, a small amount, about 4 ounces.  Please also talk to Dr. Truman Hayward about coming off of ropinirole and gabapentin as you have not noticed any benefit and if anything her balance is worse.  Your history does not suggest restless leg syndrome.

## 2019-05-06 DIAGNOSIS — R0600 Dyspnea, unspecified: Secondary | ICD-10-CM | POA: Diagnosis not present

## 2019-05-06 DIAGNOSIS — F419 Anxiety disorder, unspecified: Secondary | ICD-10-CM | POA: Diagnosis not present

## 2019-05-06 DIAGNOSIS — B370 Candidal stomatitis: Secondary | ICD-10-CM | POA: Diagnosis not present

## 2019-05-06 DIAGNOSIS — J449 Chronic obstructive pulmonary disease, unspecified: Secondary | ICD-10-CM | POA: Diagnosis not present

## 2019-05-07 DIAGNOSIS — R0600 Dyspnea, unspecified: Secondary | ICD-10-CM | POA: Diagnosis not present

## 2019-05-07 DIAGNOSIS — R05 Cough: Secondary | ICD-10-CM | POA: Diagnosis not present

## 2019-05-10 ENCOUNTER — Telehealth: Payer: Self-pay

## 2019-05-10 LAB — C-REACTIVE PROTEIN: CRP: 1 mg/L (ref 0–10)

## 2019-05-10 LAB — MULTIPLE MYELOMA PANEL, SERUM
Albumin SerPl Elph-Mcnc: 3.7 g/dL (ref 2.9–4.4)
Albumin/Glob SerPl: 1.2 (ref 0.7–1.7)
Alpha 1: 0.3 g/dL (ref 0.0–0.4)
Alpha2 Glob SerPl Elph-Mcnc: 1 g/dL (ref 0.4–1.0)
B-Globulin SerPl Elph-Mcnc: 1.2 g/dL (ref 0.7–1.3)
Gamma Glob SerPl Elph-Mcnc: 0.8 g/dL (ref 0.4–1.8)
Globulin, Total: 3.2 g/dL (ref 2.2–3.9)
IgA/Immunoglobulin A, Serum: 221 mg/dL (ref 64–422)
IgG (Immunoglobin G), Serum: 892 mg/dL (ref 586–1602)
IgM (Immunoglobulin M), Srm: 44 mg/dL (ref 26–217)

## 2019-05-10 LAB — COMPREHENSIVE METABOLIC PANEL
ALT: 32 IU/L (ref 0–32)
AST: 18 IU/L (ref 0–40)
Albumin/Globulin Ratio: 1.8 (ref 1.2–2.2)
Albumin: 4.4 g/dL (ref 3.7–4.7)
Alkaline Phosphatase: 135 IU/L — ABNORMAL HIGH (ref 39–117)
BUN/Creatinine Ratio: 13 (ref 12–28)
BUN: 13 mg/dL (ref 8–27)
Bilirubin Total: 0.6 mg/dL (ref 0.0–1.2)
CO2: 24 mmol/L (ref 20–29)
Calcium: 9.5 mg/dL (ref 8.7–10.3)
Chloride: 104 mmol/L (ref 96–106)
Creatinine, Ser: 0.97 mg/dL (ref 0.57–1.00)
GFR calc Af Amer: 67 mL/min/{1.73_m2} (ref >59)
GFR calc non Af Amer: 59 mL/min/{1.73_m2} — ABNORMAL LOW (ref >59)
Globulin, Total: 2.5 g/dL (ref 1.5–4.5)
Glucose: 92 mg/dL (ref 65–99)
Potassium: 5 mmol/L (ref 3.5–5.2)
Sodium: 144 mmol/L (ref 134–144)
Total Protein: 6.9 g/dL (ref 6.0–8.5)

## 2019-05-10 LAB — ANA W/REFLEX: Anti Nuclear Antibody (ANA): NEGATIVE

## 2019-05-10 LAB — CK: Total CK: 78 U/L (ref 32–182)

## 2019-05-10 LAB — HEAVY METALS PROFILE II, BLOOD
Arsenic: 7 ug/L (ref 2–23)
Cadmium: 1.6 ug/L — ABNORMAL HIGH (ref 0.0–1.2)
Lead, Blood: 1 ug/dL (ref 0–4)
Mercury: NOT DETECTED ug/L (ref 0.0–14.9)

## 2019-05-10 LAB — AMMONIA: Ammonia: 72 ug/dL (ref 31–169)

## 2019-05-10 LAB — PHOSPHORUS: Phosphorus: 4.4 mg/dL — ABNORMAL HIGH (ref 3.0–4.3)

## 2019-05-10 LAB — RPR: RPR Ser Ql: NONREACTIVE

## 2019-05-10 LAB — VITAMIN B1: Thiamine: 177.2 nmol/L (ref 66.5–200.0)

## 2019-05-10 LAB — MAGNESIUM: Magnesium: 2.2 mg/dL (ref 1.6–2.3)

## 2019-05-10 NOTE — Progress Notes (Signed)
Labs were unremarkable, Including autoimmune markers, inflammatory markers, muscle enzymes.  There were a few minor findings: borderline increase in phosphorus level which would not explain her cramping and detection of slightly higher level of cadmium in her blood, which may be explained by her smoking. 1 of the liver enzymes called alkaline phosphatase was very mildly elevated.  This would be something that can be monitored by her primary care physician with the next blood work.  No further action is required from my end of things at this time. Please update patient.

## 2019-05-10 NOTE — Telephone Encounter (Signed)
I called pt and discussed her lab results and recommendations. Pt verbalized understanding of results and recommendations. Pt had no questions at this time but was encouraged to call back if questions arise.

## 2019-05-10 NOTE — Telephone Encounter (Signed)
-----   Message from Star Age, MD sent at 05/10/2019  4:52 PM EDT ----- Labs were unremarkable, Including autoimmune markers, inflammatory markers, muscle enzymes.  There were a few minor findings: borderline increase in phosphorus level which would not explain her cramping and detection of slightly higher level of cadmium in her blood, which may be explained by her smoking. 1 of the liver enzymes called alkaline phosphatase was very mildly elevated.  This would be something that can be monitored by her primary care physician with the next blood work.  No further action is required from my end of things at this time. Please update patient.

## 2019-05-17 DIAGNOSIS — E785 Hyperlipidemia, unspecified: Secondary | ICD-10-CM | POA: Diagnosis not present

## 2019-05-17 DIAGNOSIS — R7989 Other specified abnormal findings of blood chemistry: Secondary | ICD-10-CM | POA: Diagnosis not present

## 2019-05-17 DIAGNOSIS — F339 Major depressive disorder, recurrent, unspecified: Secondary | ICD-10-CM | POA: Diagnosis not present

## 2019-05-17 DIAGNOSIS — R600 Localized edema: Secondary | ICD-10-CM | POA: Diagnosis not present

## 2019-05-22 DIAGNOSIS — S81812A Laceration without foreign body, left lower leg, initial encounter: Secondary | ICD-10-CM | POA: Diagnosis not present

## 2019-05-31 DIAGNOSIS — L0390 Cellulitis, unspecified: Secondary | ICD-10-CM | POA: Diagnosis not present

## 2019-05-31 DIAGNOSIS — R7989 Other specified abnormal findings of blood chemistry: Secondary | ICD-10-CM | POA: Diagnosis not present

## 2019-05-31 DIAGNOSIS — F339 Major depressive disorder, recurrent, unspecified: Secondary | ICD-10-CM | POA: Diagnosis not present

## 2019-05-31 DIAGNOSIS — E785 Hyperlipidemia, unspecified: Secondary | ICD-10-CM | POA: Diagnosis not present

## 2019-06-07 DIAGNOSIS — E785 Hyperlipidemia, unspecified: Secondary | ICD-10-CM | POA: Diagnosis not present

## 2019-06-07 DIAGNOSIS — L0390 Cellulitis, unspecified: Secondary | ICD-10-CM | POA: Diagnosis not present

## 2019-06-07 DIAGNOSIS — I10 Essential (primary) hypertension: Secondary | ICD-10-CM | POA: Diagnosis not present

## 2019-06-07 DIAGNOSIS — R7989 Other specified abnormal findings of blood chemistry: Secondary | ICD-10-CM | POA: Diagnosis not present

## 2019-06-07 DIAGNOSIS — F339 Major depressive disorder, recurrent, unspecified: Secondary | ICD-10-CM | POA: Diagnosis not present

## 2019-06-18 DIAGNOSIS — F339 Major depressive disorder, recurrent, unspecified: Secondary | ICD-10-CM | POA: Diagnosis not present

## 2019-06-18 DIAGNOSIS — L0390 Cellulitis, unspecified: Secondary | ICD-10-CM | POA: Diagnosis not present

## 2019-06-18 DIAGNOSIS — E785 Hyperlipidemia, unspecified: Secondary | ICD-10-CM | POA: Diagnosis not present

## 2019-06-18 DIAGNOSIS — R7989 Other specified abnormal findings of blood chemistry: Secondary | ICD-10-CM | POA: Diagnosis not present

## 2019-07-05 DIAGNOSIS — M7062 Trochanteric bursitis, left hip: Secondary | ICD-10-CM | POA: Diagnosis not present

## 2019-07-06 ENCOUNTER — Encounter: Payer: 59 | Admitting: Neurology

## 2019-07-14 ENCOUNTER — Telehealth: Payer: Self-pay | Admitting: *Deleted

## 2019-07-14 NOTE — Telephone Encounter (Signed)
Please call pt back when available.  °

## 2019-07-14 NOTE — Telephone Encounter (Signed)
Called patient and apologized that the opening was inadvertently filled by another staff member.  I put her on wait list as high priority for any cancellations. She stated she understood, verbalized appreciation.

## 2019-07-14 NOTE — Telephone Encounter (Signed)
Called patient and offered to move her EMG/NCS to tomorrow. She will have to call sister for transportation. She will call back whether she can come or not to let office know. I advised her that phone staff can reschedule EMG/NCS for her. She  verbalized understanding, appreciation.

## 2019-07-16 DIAGNOSIS — F339 Major depressive disorder, recurrent, unspecified: Secondary | ICD-10-CM | POA: Diagnosis not present

## 2019-07-16 DIAGNOSIS — J208 Acute bronchitis due to other specified organisms: Secondary | ICD-10-CM | POA: Diagnosis not present

## 2019-07-16 DIAGNOSIS — E785 Hyperlipidemia, unspecified: Secondary | ICD-10-CM | POA: Diagnosis not present

## 2019-07-16 DIAGNOSIS — B9689 Other specified bacterial agents as the cause of diseases classified elsewhere: Secondary | ICD-10-CM | POA: Diagnosis not present

## 2019-08-03 DIAGNOSIS — Z9181 History of falling: Secondary | ICD-10-CM | POA: Diagnosis not present

## 2019-08-03 DIAGNOSIS — Z1231 Encounter for screening mammogram for malignant neoplasm of breast: Secondary | ICD-10-CM | POA: Diagnosis not present

## 2019-08-03 DIAGNOSIS — Z139 Encounter for screening, unspecified: Secondary | ICD-10-CM | POA: Diagnosis not present

## 2019-08-03 DIAGNOSIS — E785 Hyperlipidemia, unspecified: Secondary | ICD-10-CM | POA: Diagnosis not present

## 2019-08-03 DIAGNOSIS — Z1331 Encounter for screening for depression: Secondary | ICD-10-CM | POA: Diagnosis not present

## 2019-08-03 DIAGNOSIS — Z0000 Encounter for general adult medical examination without abnormal findings: Secondary | ICD-10-CM | POA: Diagnosis not present

## 2019-08-05 ENCOUNTER — Ambulatory Visit (INDEPENDENT_AMBULATORY_CARE_PROVIDER_SITE_OTHER): Payer: Medicare Other | Admitting: Diagnostic Neuroimaging

## 2019-08-05 ENCOUNTER — Encounter (INDEPENDENT_AMBULATORY_CARE_PROVIDER_SITE_OTHER): Payer: Medicare Other | Admitting: Diagnostic Neuroimaging

## 2019-08-05 ENCOUNTER — Other Ambulatory Visit: Payer: Self-pay

## 2019-08-05 ENCOUNTER — Encounter

## 2019-08-05 DIAGNOSIS — R252 Cramp and spasm: Secondary | ICD-10-CM | POA: Diagnosis not present

## 2019-08-05 DIAGNOSIS — Z0289 Encounter for other administrative examinations: Secondary | ICD-10-CM

## 2019-08-05 DIAGNOSIS — G4762 Sleep related leg cramps: Secondary | ICD-10-CM

## 2019-08-05 NOTE — Procedures (Signed)
[facility]  NCS (NERVE CONDUCTION STUDY) WITH EMG (ELECTROMYOGRAPHY) REPORT   STUDY DATE: 08/05/19 PATIENT NAME: Tina George DOB: 1946-07-21 MRN: [id number]  ORDERING CLINICIAN: Star Age, MD PhD   TECHNOLOGIST: Sherre Scarlet ELECTROMYOGRAPHER: Earlean Polka. , MD  CLINICAL INFORMATION: 73 year old female with bilateral lower extremity numbness and muscle cramps.  FINDINGS: NERVE CONDUCTION STUDY: Left peroneal motor response could not be obtained.  Right peroneal motor response has normal distal latency, amplitude, borderline conduction velocity.  Right tibial motor response is normal.  Left tibial motor sponsors normal distal latency, decreased amplitude, normal conduction velocity.  Bilateral sural sensory responses have decreased amplitudes.  Left superficial peroneal sensory response could not be obtained.  Right superficial peroneal signs response has decreased amplitude.   NEEDLE ELECTROMYOGRAPHY:  Needle examination of left lower extremity vastus medialis, tibialis anterior, gastrocnemius is normal.   IMPRESSION:   This study demonstrates - Axonal sensorimotor polyneuropathy.   - Left peroneal nerve responses could not be obtained and may reflect superimposed left peroneal neuropathy.  - No underlying evidence of myopathy at this time.    INTERPRETING PHYSICIAN:  Penni Bombard, MD Certified in Neurology, Neurophysiology and Neuroimaging  [facility] [address] [place], [place] [address] [phone]   [facility]    Nerve / Sites Muscle Latency Ref. Amplitude Ref. Rel Amp Segments Distance Velocity Ref. Area    ms ms mV mV %  cm m/s m/s mVms  L Peroneal - EDB     Ankle EDB NR ?6.5 NR ?2.0 NR Ankle - EDB 9   NR     Fib head EDB NR  NR  NR Fib head - Ankle 27 NR ?44 NR     Pop fossa EDB 15.4  0.5   Pop fossa - Fib head 10 NR ?44 1.0         Pop fossa - Ankle      R Peroneal - EDB     Ankle EDB  4.9 ?6.5 2.1 ?2.0 100 Ankle - EDB 9   9.5     Fib head EDB 11.5  2.0  95.1 Fib head - Ankle 27 41 ?44 8.8     Pop fossa EDB 14.0  1.7  83 Pop fossa - Fib head 10 41 ?44 7.6         Pop fossa - Ankle      L Tibial - AH     Ankle AH 4.6 ?5.8 1.6 ?4.0 100 Ankle - AH 9   3.4     Pop fossa AH 13.6  1.5  92 Pop fossa - Ankle 37 41 ?41 4.5  R Tibial - AH     Ankle AH 4.2 ?5.8 6.0 ?4.0 100 Ankle - AH 9   14.7     Pop fossa AH 12.9  4.3  72.4 Pop fossa - Ankle 37 43 ?41 15.1             SNC    Nerve / Sites Rec. Site Peak Lat Ref.  Amp Ref. Segments Distance    ms ms V V  cm  L Sural - Ankle (Calf)     Calf Ankle 4.2 ?4.4 2 ?6 Calf - Ankle 14  R Sural - Ankle (Calf)     Calf Ankle 3.5 ?4.4 3 ?6 Calf - Ankle 14  L Superficial peroneal - Ankle     Lat leg Ankle NR ?4.4 NR ?6 Lat leg - Ankle 14  R Superficial peroneal - Ankle     Lat leg Ankle 3.8 ?4.4 3 ?6 Lat leg - Ankle 14              F  Wave    Nerve F Lat Ref.   ms ms  L Tibial - AH 46.3 ?56.0  R Tibial - AH 55.1 ?56.0         EMG full       EMG Summary Table    Spontaneous MUAP Recruitment  Muscle IA Fib PSW Fasc Other Amp Dur. Poly Pattern  L. Vastus medialis Normal None None None _______ Normal Normal Normal Normal  L. Tibialis anterior Normal None None None _______ Normal Normal Normal Normal  L. Gastrocnemius (Medial head) Normal None None None _______ Normal Normal Normal Normal

## 2019-08-12 NOTE — Progress Notes (Signed)
Her EMG and nerve conduction velocity test from 08/05/2019 shows no underlying muscle disease.  She does have evidence of mild neuropathy.  We did quite a bit of blood work when she came to the office recently, no obvious cause for neuropathy has been found.  If she would like to seek a more expert opinion we can make a referral to a neuromuscular specialist at either [facility] or [place]. Please update patient as to her EMG results and let me know if she would like to have a referral to a neuromuscular disease expert at one of the academic facilities to further investigate her cramping and cause for neuropathy.

## 2019-08-13 DIAGNOSIS — E785 Hyperlipidemia, unspecified: Secondary | ICD-10-CM | POA: Diagnosis not present

## 2019-08-13 DIAGNOSIS — F339 Major depressive disorder, recurrent, unspecified: Secondary | ICD-10-CM | POA: Diagnosis not present

## 2019-08-13 DIAGNOSIS — Z23 Encounter for immunization: Secondary | ICD-10-CM | POA: Diagnosis not present

## 2019-08-13 DIAGNOSIS — R7989 Other specified abnormal findings of blood chemistry: Secondary | ICD-10-CM | POA: Diagnosis not present

## 2019-08-13 DIAGNOSIS — I10 Essential (primary) hypertension: Secondary | ICD-10-CM | POA: Diagnosis not present

## 2019-08-16 ENCOUNTER — Telehealth: Payer: Self-pay

## 2019-08-16 DIAGNOSIS — M159 Polyosteoarthritis, unspecified: Secondary | ICD-10-CM | POA: Diagnosis not present

## 2019-08-16 DIAGNOSIS — F339 Major depressive disorder, recurrent, unspecified: Secondary | ICD-10-CM | POA: Diagnosis not present

## 2019-08-16 DIAGNOSIS — E785 Hyperlipidemia, unspecified: Secondary | ICD-10-CM | POA: Diagnosis not present

## 2019-08-16 DIAGNOSIS — R7989 Other specified abnormal findings of blood chemistry: Secondary | ICD-10-CM | POA: Diagnosis not present

## 2019-08-16 NOTE — Telephone Encounter (Signed)
I called pt and discussed these results with her. Pt asked for an appt to discuss these results further with Dr. Rexene Alberts before making a decision on a referral to a neuromuscular specialist. Pt verbalized understanding of results. An appt was made for 08/23/19 at 1:00pm. Pt verbalized understanding of appt date and time.

## 2019-08-16 NOTE — Telephone Encounter (Signed)
-----   Message from Star Age, MD sent at 08/12/2019  5:52 PM EDT ----- Her EMG and nerve conduction velocity test from 08/05/2019 shows no underlying muscle disease.  She does have evidence of mild neuropathy.  We did quite a bit of blood work when she came to the office recently, no obvious cause for neuropathy has been found.  If she would like to seek a more expert opinion we can make a referral to a neuromuscular specialist at either [facility] or [place]. Please update patient as to her EMG results and let me know if she would like to have a referral to a neuromuscular disease expert at one of the academic facilities to further investigate her cramping and cause for neuropathy.

## 2019-08-23 ENCOUNTER — Ambulatory Visit: Payer: Self-pay | Admitting: Neurology

## 2019-08-23 DIAGNOSIS — M5416 Radiculopathy, lumbar region: Secondary | ICD-10-CM | POA: Diagnosis not present

## 2019-08-24 ENCOUNTER — Encounter: Payer: Self-pay | Admitting: Neurology

## 2019-08-31 DIAGNOSIS — M5126 Other intervertebral disc displacement, lumbar region: Secondary | ICD-10-CM | POA: Diagnosis not present

## 2019-08-31 DIAGNOSIS — M5416 Radiculopathy, lumbar region: Secondary | ICD-10-CM | POA: Diagnosis not present

## 2019-09-06 DIAGNOSIS — M5416 Radiculopathy, lumbar region: Secondary | ICD-10-CM | POA: Diagnosis not present

## 2019-09-13 DIAGNOSIS — E785 Hyperlipidemia, unspecified: Secondary | ICD-10-CM | POA: Diagnosis not present

## 2019-09-13 DIAGNOSIS — F339 Major depressive disorder, recurrent, unspecified: Secondary | ICD-10-CM | POA: Diagnosis not present

## 2019-09-13 DIAGNOSIS — M159 Polyosteoarthritis, unspecified: Secondary | ICD-10-CM | POA: Diagnosis not present

## 2019-09-13 DIAGNOSIS — I10 Essential (primary) hypertension: Secondary | ICD-10-CM | POA: Diagnosis not present

## 2019-09-13 DIAGNOSIS — R7989 Other specified abnormal findings of blood chemistry: Secondary | ICD-10-CM | POA: Diagnosis not present

## 2019-10-22 DIAGNOSIS — M1612 Unilateral primary osteoarthritis, left hip: Secondary | ICD-10-CM | POA: Diagnosis not present

## 2019-11-22 DIAGNOSIS — R7989 Other specified abnormal findings of blood chemistry: Secondary | ICD-10-CM | POA: Diagnosis not present

## 2019-11-22 DIAGNOSIS — E785 Hyperlipidemia, unspecified: Secondary | ICD-10-CM | POA: Diagnosis not present

## 2019-11-22 DIAGNOSIS — F17200 Nicotine dependence, unspecified, uncomplicated: Secondary | ICD-10-CM | POA: Diagnosis not present

## 2019-11-22 DIAGNOSIS — F339 Major depressive disorder, recurrent, unspecified: Secondary | ICD-10-CM | POA: Diagnosis not present

## 2019-11-25 DIAGNOSIS — M1612 Unilateral primary osteoarthritis, left hip: Secondary | ICD-10-CM | POA: Diagnosis not present

## 2019-12-06 DIAGNOSIS — R7989 Other specified abnormal findings of blood chemistry: Secondary | ICD-10-CM | POA: Diagnosis not present

## 2019-12-06 DIAGNOSIS — E785 Hyperlipidemia, unspecified: Secondary | ICD-10-CM | POA: Diagnosis not present

## 2019-12-06 DIAGNOSIS — Z01810 Encounter for preprocedural cardiovascular examination: Secondary | ICD-10-CM | POA: Diagnosis not present

## 2019-12-06 DIAGNOSIS — F339 Major depressive disorder, recurrent, unspecified: Secondary | ICD-10-CM | POA: Diagnosis not present

## 2019-12-06 DIAGNOSIS — I10 Essential (primary) hypertension: Secondary | ICD-10-CM | POA: Diagnosis not present

## 2019-12-09 DIAGNOSIS — L4050 Arthropathic psoriasis, unspecified: Secondary | ICD-10-CM | POA: Diagnosis not present

## 2019-12-09 DIAGNOSIS — L400 Psoriasis vulgaris: Secondary | ICD-10-CM | POA: Diagnosis not present

## 2019-12-26 DIAGNOSIS — Z23 Encounter for immunization: Secondary | ICD-10-CM | POA: Diagnosis not present

## 2020-01-03 DIAGNOSIS — M8080XA Other osteoporosis with current pathological fracture, unspecified site, initial encounter for fracture: Secondary | ICD-10-CM | POA: Diagnosis not present

## 2020-01-03 DIAGNOSIS — E785 Hyperlipidemia, unspecified: Secondary | ICD-10-CM | POA: Diagnosis not present

## 2020-01-03 DIAGNOSIS — F339 Major depressive disorder, recurrent, unspecified: Secondary | ICD-10-CM | POA: Diagnosis not present

## 2020-01-03 DIAGNOSIS — R7989 Other specified abnormal findings of blood chemistry: Secondary | ICD-10-CM | POA: Diagnosis not present

## 2020-01-06 DIAGNOSIS — L4050 Arthropathic psoriasis, unspecified: Secondary | ICD-10-CM | POA: Diagnosis not present

## 2020-01-06 DIAGNOSIS — E785 Hyperlipidemia, unspecified: Secondary | ICD-10-CM | POA: Diagnosis not present

## 2020-01-06 DIAGNOSIS — Z111 Encounter for screening for respiratory tuberculosis: Secondary | ICD-10-CM | POA: Diagnosis not present

## 2020-01-06 DIAGNOSIS — L400 Psoriasis vulgaris: Secondary | ICD-10-CM | POA: Diagnosis not present

## 2020-01-06 DIAGNOSIS — R531 Weakness: Secondary | ICD-10-CM | POA: Diagnosis not present

## 2020-01-06 DIAGNOSIS — Z79899 Other long term (current) drug therapy: Secondary | ICD-10-CM | POA: Diagnosis not present

## 2020-01-23 DIAGNOSIS — Z23 Encounter for immunization: Secondary | ICD-10-CM | POA: Diagnosis not present

## 2020-01-31 DIAGNOSIS — I10 Essential (primary) hypertension: Secondary | ICD-10-CM | POA: Diagnosis not present

## 2020-01-31 DIAGNOSIS — M159 Polyosteoarthritis, unspecified: Secondary | ICD-10-CM | POA: Diagnosis not present

## 2020-01-31 DIAGNOSIS — E785 Hyperlipidemia, unspecified: Secondary | ICD-10-CM | POA: Diagnosis not present

## 2020-01-31 DIAGNOSIS — F339 Major depressive disorder, recurrent, unspecified: Secondary | ICD-10-CM | POA: Diagnosis not present

## 2020-01-31 DIAGNOSIS — R7989 Other specified abnormal findings of blood chemistry: Secondary | ICD-10-CM | POA: Diagnosis not present

## 2020-02-14 DIAGNOSIS — F339 Major depressive disorder, recurrent, unspecified: Secondary | ICD-10-CM | POA: Diagnosis not present

## 2020-02-14 DIAGNOSIS — R252 Cramp and spasm: Secondary | ICD-10-CM | POA: Diagnosis not present

## 2020-02-14 DIAGNOSIS — R7989 Other specified abnormal findings of blood chemistry: Secondary | ICD-10-CM | POA: Diagnosis not present

## 2020-02-14 DIAGNOSIS — M159 Polyosteoarthritis, unspecified: Secondary | ICD-10-CM | POA: Diagnosis not present

## 2020-02-15 DIAGNOSIS — E559 Vitamin D deficiency, unspecified: Secondary | ICD-10-CM | POA: Diagnosis not present

## 2020-02-15 DIAGNOSIS — Z79899 Other long term (current) drug therapy: Secondary | ICD-10-CM | POA: Diagnosis not present

## 2020-02-15 DIAGNOSIS — M79609 Pain in unspecified limb: Secondary | ICD-10-CM | POA: Diagnosis not present

## 2020-02-15 DIAGNOSIS — J449 Chronic obstructive pulmonary disease, unspecified: Secondary | ICD-10-CM | POA: Diagnosis not present

## 2020-02-15 DIAGNOSIS — Z01818 Encounter for other preprocedural examination: Secondary | ICD-10-CM | POA: Diagnosis not present

## 2020-02-15 DIAGNOSIS — R52 Pain, unspecified: Secondary | ICD-10-CM | POA: Diagnosis not present

## 2020-02-18 DIAGNOSIS — F339 Major depressive disorder, recurrent, unspecified: Secondary | ICD-10-CM | POA: Diagnosis not present

## 2020-02-18 DIAGNOSIS — M159 Polyosteoarthritis, unspecified: Secondary | ICD-10-CM | POA: Diagnosis not present

## 2020-02-18 DIAGNOSIS — R7989 Other specified abnormal findings of blood chemistry: Secondary | ICD-10-CM | POA: Diagnosis not present

## 2020-02-18 DIAGNOSIS — N390 Urinary tract infection, site not specified: Secondary | ICD-10-CM | POA: Diagnosis not present

## 2020-02-21 DIAGNOSIS — I10 Essential (primary) hypertension: Secondary | ICD-10-CM | POA: Diagnosis not present

## 2020-02-21 DIAGNOSIS — F339 Major depressive disorder, recurrent, unspecified: Secondary | ICD-10-CM | POA: Diagnosis not present

## 2020-02-21 DIAGNOSIS — R7989 Other specified abnormal findings of blood chemistry: Secondary | ICD-10-CM | POA: Diagnosis not present

## 2020-02-21 DIAGNOSIS — M159 Polyosteoarthritis, unspecified: Secondary | ICD-10-CM | POA: Diagnosis not present

## 2020-02-28 DIAGNOSIS — M159 Polyosteoarthritis, unspecified: Secondary | ICD-10-CM | POA: Diagnosis not present

## 2020-02-28 DIAGNOSIS — R7989 Other specified abnormal findings of blood chemistry: Secondary | ICD-10-CM | POA: Diagnosis not present

## 2020-02-28 DIAGNOSIS — F339 Major depressive disorder, recurrent, unspecified: Secondary | ICD-10-CM | POA: Diagnosis not present

## 2020-02-28 DIAGNOSIS — I10 Essential (primary) hypertension: Secondary | ICD-10-CM | POA: Diagnosis not present

## 2020-02-29 DIAGNOSIS — Z1159 Encounter for screening for other viral diseases: Secondary | ICD-10-CM | POA: Diagnosis not present

## 2020-03-06 DIAGNOSIS — F17210 Nicotine dependence, cigarettes, uncomplicated: Secondary | ICD-10-CM | POA: Diagnosis not present

## 2020-03-06 DIAGNOSIS — Z96642 Presence of left artificial hip joint: Secondary | ICD-10-CM | POA: Diagnosis not present

## 2020-03-06 DIAGNOSIS — E785 Hyperlipidemia, unspecified: Secondary | ICD-10-CM | POA: Diagnosis not present

## 2020-03-06 DIAGNOSIS — L409 Psoriasis, unspecified: Secondary | ICD-10-CM | POA: Diagnosis not present

## 2020-03-06 DIAGNOSIS — Z79899 Other long term (current) drug therapy: Secondary | ICD-10-CM | POA: Diagnosis not present

## 2020-03-06 DIAGNOSIS — Z471 Aftercare following joint replacement surgery: Secondary | ICD-10-CM | POA: Diagnosis not present

## 2020-03-06 DIAGNOSIS — I10 Essential (primary) hypertension: Secondary | ICD-10-CM | POA: Diagnosis not present

## 2020-03-06 DIAGNOSIS — M1990 Unspecified osteoarthritis, unspecified site: Secondary | ICD-10-CM | POA: Diagnosis not present

## 2020-03-06 DIAGNOSIS — G250 Essential tremor: Secondary | ICD-10-CM | POA: Diagnosis not present

## 2020-03-06 DIAGNOSIS — M1612 Unilateral primary osteoarthritis, left hip: Secondary | ICD-10-CM | POA: Diagnosis not present

## 2020-03-06 DIAGNOSIS — J449 Chronic obstructive pulmonary disease, unspecified: Secondary | ICD-10-CM | POA: Diagnosis not present

## 2020-03-06 DIAGNOSIS — K219 Gastro-esophageal reflux disease without esophagitis: Secondary | ICD-10-CM | POA: Diagnosis not present

## 2020-03-07 DIAGNOSIS — J449 Chronic obstructive pulmonary disease, unspecified: Secondary | ICD-10-CM | POA: Diagnosis not present

## 2020-03-07 DIAGNOSIS — E785 Hyperlipidemia, unspecified: Secondary | ICD-10-CM | POA: Diagnosis not present

## 2020-03-07 DIAGNOSIS — M1612 Unilateral primary osteoarthritis, left hip: Secondary | ICD-10-CM | POA: Diagnosis not present

## 2020-03-07 DIAGNOSIS — K219 Gastro-esophageal reflux disease without esophagitis: Secondary | ICD-10-CM | POA: Diagnosis not present

## 2020-03-07 DIAGNOSIS — G250 Essential tremor: Secondary | ICD-10-CM | POA: Diagnosis not present

## 2020-03-07 DIAGNOSIS — L409 Psoriasis, unspecified: Secondary | ICD-10-CM | POA: Diagnosis not present

## 2020-03-27 DIAGNOSIS — F339 Major depressive disorder, recurrent, unspecified: Secondary | ICD-10-CM | POA: Diagnosis not present

## 2020-03-27 DIAGNOSIS — R7989 Other specified abnormal findings of blood chemistry: Secondary | ICD-10-CM | POA: Diagnosis not present

## 2020-03-27 DIAGNOSIS — M159 Polyosteoarthritis, unspecified: Secondary | ICD-10-CM | POA: Diagnosis not present

## 2020-03-27 DIAGNOSIS — I10 Essential (primary) hypertension: Secondary | ICD-10-CM | POA: Diagnosis not present

## 2020-04-27 DIAGNOSIS — E785 Hyperlipidemia, unspecified: Secondary | ICD-10-CM | POA: Diagnosis not present

## 2020-04-27 DIAGNOSIS — R7989 Other specified abnormal findings of blood chemistry: Secondary | ICD-10-CM | POA: Diagnosis not present

## 2020-04-27 DIAGNOSIS — R252 Cramp and spasm: Secondary | ICD-10-CM | POA: Diagnosis not present

## 2020-04-27 DIAGNOSIS — E876 Hypokalemia: Secondary | ICD-10-CM | POA: Diagnosis not present

## 2020-04-27 DIAGNOSIS — J449 Chronic obstructive pulmonary disease, unspecified: Secondary | ICD-10-CM | POA: Diagnosis not present

## 2020-04-27 DIAGNOSIS — R350 Frequency of micturition: Secondary | ICD-10-CM | POA: Diagnosis not present

## 2020-04-27 DIAGNOSIS — I10 Essential (primary) hypertension: Secondary | ICD-10-CM | POA: Diagnosis not present

## 2020-04-27 DIAGNOSIS — R600 Localized edema: Secondary | ICD-10-CM | POA: Diagnosis not present

## 2020-04-27 DIAGNOSIS — F339 Major depressive disorder, recurrent, unspecified: Secondary | ICD-10-CM | POA: Diagnosis not present

## 2020-04-27 DIAGNOSIS — M159 Polyosteoarthritis, unspecified: Secondary | ICD-10-CM | POA: Diagnosis not present

## 2020-04-27 DIAGNOSIS — H9193 Unspecified hearing loss, bilateral: Secondary | ICD-10-CM | POA: Diagnosis not present

## 2020-04-27 DIAGNOSIS — R0600 Dyspnea, unspecified: Secondary | ICD-10-CM | POA: Diagnosis not present

## 2020-05-02 DIAGNOSIS — M1612 Unilateral primary osteoarthritis, left hip: Secondary | ICD-10-CM | POA: Diagnosis not present

## 2020-05-02 DIAGNOSIS — Z96642 Presence of left artificial hip joint: Secondary | ICD-10-CM | POA: Diagnosis not present

## 2020-05-11 DIAGNOSIS — I10 Essential (primary) hypertension: Secondary | ICD-10-CM | POA: Diagnosis not present

## 2020-05-11 DIAGNOSIS — F419 Anxiety disorder, unspecified: Secondary | ICD-10-CM | POA: Diagnosis not present

## 2020-05-11 DIAGNOSIS — R7989 Other specified abnormal findings of blood chemistry: Secondary | ICD-10-CM | POA: Diagnosis not present

## 2020-05-11 DIAGNOSIS — E785 Hyperlipidemia, unspecified: Secondary | ICD-10-CM | POA: Diagnosis not present

## 2020-05-11 DIAGNOSIS — H9193 Unspecified hearing loss, bilateral: Secondary | ICD-10-CM | POA: Diagnosis not present

## 2020-05-11 DIAGNOSIS — E876 Hypokalemia: Secondary | ICD-10-CM | POA: Diagnosis not present

## 2020-05-11 DIAGNOSIS — J449 Chronic obstructive pulmonary disease, unspecified: Secondary | ICD-10-CM | POA: Diagnosis not present

## 2020-05-11 DIAGNOSIS — F339 Major depressive disorder, recurrent, unspecified: Secondary | ICD-10-CM | POA: Diagnosis not present

## 2020-05-11 DIAGNOSIS — M159 Polyosteoarthritis, unspecified: Secondary | ICD-10-CM | POA: Diagnosis not present

## 2020-05-11 DIAGNOSIS — R600 Localized edema: Secondary | ICD-10-CM | POA: Diagnosis not present

## 2020-05-11 DIAGNOSIS — R0600 Dyspnea, unspecified: Secondary | ICD-10-CM | POA: Diagnosis not present

## 2020-05-11 DIAGNOSIS — R252 Cramp and spasm: Secondary | ICD-10-CM | POA: Diagnosis not present

## 2020-05-15 DIAGNOSIS — E785 Hyperlipidemia, unspecified: Secondary | ICD-10-CM | POA: Diagnosis not present

## 2020-05-15 DIAGNOSIS — R7989 Other specified abnormal findings of blood chemistry: Secondary | ICD-10-CM | POA: Diagnosis not present

## 2020-05-15 DIAGNOSIS — I10 Essential (primary) hypertension: Secondary | ICD-10-CM | POA: Diagnosis not present

## 2020-05-15 DIAGNOSIS — F419 Anxiety disorder, unspecified: Secondary | ICD-10-CM | POA: Diagnosis not present

## 2020-05-15 DIAGNOSIS — R0600 Dyspnea, unspecified: Secondary | ICD-10-CM | POA: Diagnosis not present

## 2020-05-15 DIAGNOSIS — M159 Polyosteoarthritis, unspecified: Secondary | ICD-10-CM | POA: Diagnosis not present

## 2020-05-15 DIAGNOSIS — E876 Hypokalemia: Secondary | ICD-10-CM | POA: Diagnosis not present

## 2020-05-15 DIAGNOSIS — R252 Cramp and spasm: Secondary | ICD-10-CM | POA: Diagnosis not present

## 2020-05-15 DIAGNOSIS — R600 Localized edema: Secondary | ICD-10-CM | POA: Diagnosis not present

## 2020-05-15 DIAGNOSIS — F339 Major depressive disorder, recurrent, unspecified: Secondary | ICD-10-CM | POA: Diagnosis not present

## 2020-05-15 DIAGNOSIS — L0390 Cellulitis, unspecified: Secondary | ICD-10-CM | POA: Diagnosis not present

## 2020-05-15 DIAGNOSIS — H9193 Unspecified hearing loss, bilateral: Secondary | ICD-10-CM | POA: Diagnosis not present

## 2020-05-18 DIAGNOSIS — I361 Nonrheumatic tricuspid (valve) insufficiency: Secondary | ICD-10-CM | POA: Diagnosis not present

## 2020-05-18 DIAGNOSIS — R600 Localized edema: Secondary | ICD-10-CM | POA: Diagnosis not present

## 2020-05-25 DIAGNOSIS — I10 Essential (primary) hypertension: Secondary | ICD-10-CM | POA: Diagnosis not present

## 2020-05-25 DIAGNOSIS — H9193 Unspecified hearing loss, bilateral: Secondary | ICD-10-CM | POA: Diagnosis not present

## 2020-05-25 DIAGNOSIS — J449 Chronic obstructive pulmonary disease, unspecified: Secondary | ICD-10-CM | POA: Diagnosis not present

## 2020-05-25 DIAGNOSIS — R7989 Other specified abnormal findings of blood chemistry: Secondary | ICD-10-CM | POA: Diagnosis not present

## 2020-05-25 DIAGNOSIS — M159 Polyosteoarthritis, unspecified: Secondary | ICD-10-CM | POA: Diagnosis not present

## 2020-05-25 DIAGNOSIS — E785 Hyperlipidemia, unspecified: Secondary | ICD-10-CM | POA: Diagnosis not present

## 2020-05-25 DIAGNOSIS — E876 Hypokalemia: Secondary | ICD-10-CM | POA: Diagnosis not present

## 2020-05-25 DIAGNOSIS — F419 Anxiety disorder, unspecified: Secondary | ICD-10-CM | POA: Diagnosis not present

## 2020-05-25 DIAGNOSIS — F339 Major depressive disorder, recurrent, unspecified: Secondary | ICD-10-CM | POA: Diagnosis not present

## 2020-05-25 DIAGNOSIS — M8080XA Other osteoporosis with current pathological fracture, unspecified site, initial encounter for fracture: Secondary | ICD-10-CM | POA: Diagnosis not present

## 2020-05-25 DIAGNOSIS — G629 Polyneuropathy, unspecified: Secondary | ICD-10-CM | POA: Diagnosis not present

## 2020-05-25 DIAGNOSIS — R0600 Dyspnea, unspecified: Secondary | ICD-10-CM | POA: Diagnosis not present

## 2020-05-25 DIAGNOSIS — R252 Cramp and spasm: Secondary | ICD-10-CM | POA: Diagnosis not present

## 2020-05-30 DIAGNOSIS — R7989 Other specified abnormal findings of blood chemistry: Secondary | ICD-10-CM | POA: Diagnosis not present

## 2020-05-30 DIAGNOSIS — E785 Hyperlipidemia, unspecified: Secondary | ICD-10-CM | POA: Diagnosis not present

## 2020-05-30 DIAGNOSIS — F339 Major depressive disorder, recurrent, unspecified: Secondary | ICD-10-CM | POA: Diagnosis not present

## 2020-05-30 DIAGNOSIS — G629 Polyneuropathy, unspecified: Secondary | ICD-10-CM | POA: Diagnosis not present

## 2020-05-30 DIAGNOSIS — R0600 Dyspnea, unspecified: Secondary | ICD-10-CM | POA: Diagnosis not present

## 2020-05-30 DIAGNOSIS — M159 Polyosteoarthritis, unspecified: Secondary | ICD-10-CM | POA: Diagnosis not present

## 2020-05-30 DIAGNOSIS — F419 Anxiety disorder, unspecified: Secondary | ICD-10-CM | POA: Diagnosis not present

## 2020-05-30 DIAGNOSIS — N289 Disorder of kidney and ureter, unspecified: Secondary | ICD-10-CM | POA: Diagnosis not present

## 2020-05-30 DIAGNOSIS — R252 Cramp and spasm: Secondary | ICD-10-CM | POA: Diagnosis not present

## 2020-05-30 DIAGNOSIS — E876 Hypokalemia: Secondary | ICD-10-CM | POA: Diagnosis not present

## 2020-05-30 DIAGNOSIS — J449 Chronic obstructive pulmonary disease, unspecified: Secondary | ICD-10-CM | POA: Diagnosis not present

## 2020-05-30 DIAGNOSIS — H9193 Unspecified hearing loss, bilateral: Secondary | ICD-10-CM | POA: Diagnosis not present

## 2020-06-06 DIAGNOSIS — R0600 Dyspnea, unspecified: Secondary | ICD-10-CM | POA: Diagnosis not present

## 2020-06-06 DIAGNOSIS — G629 Polyneuropathy, unspecified: Secondary | ICD-10-CM | POA: Diagnosis not present

## 2020-06-06 DIAGNOSIS — E876 Hypokalemia: Secondary | ICD-10-CM | POA: Diagnosis not present

## 2020-06-06 DIAGNOSIS — R7989 Other specified abnormal findings of blood chemistry: Secondary | ICD-10-CM | POA: Diagnosis not present

## 2020-06-06 DIAGNOSIS — H9193 Unspecified hearing loss, bilateral: Secondary | ICD-10-CM | POA: Diagnosis not present

## 2020-06-06 DIAGNOSIS — N1832 Chronic kidney disease, stage 3b: Secondary | ICD-10-CM | POA: Diagnosis not present

## 2020-06-06 DIAGNOSIS — F419 Anxiety disorder, unspecified: Secondary | ICD-10-CM | POA: Diagnosis not present

## 2020-06-06 DIAGNOSIS — R252 Cramp and spasm: Secondary | ICD-10-CM | POA: Diagnosis not present

## 2020-06-06 DIAGNOSIS — J449 Chronic obstructive pulmonary disease, unspecified: Secondary | ICD-10-CM | POA: Diagnosis not present

## 2020-06-06 DIAGNOSIS — F339 Major depressive disorder, recurrent, unspecified: Secondary | ICD-10-CM | POA: Diagnosis not present

## 2020-06-06 DIAGNOSIS — E785 Hyperlipidemia, unspecified: Secondary | ICD-10-CM | POA: Diagnosis not present

## 2020-06-06 DIAGNOSIS — M159 Polyosteoarthritis, unspecified: Secondary | ICD-10-CM | POA: Diagnosis not present

## 2020-07-04 DIAGNOSIS — F339 Major depressive disorder, recurrent, unspecified: Secondary | ICD-10-CM | POA: Diagnosis not present

## 2020-07-04 DIAGNOSIS — M8080XA Other osteoporosis with current pathological fracture, unspecified site, initial encounter for fracture: Secondary | ICD-10-CM | POA: Diagnosis not present

## 2020-07-04 DIAGNOSIS — R600 Localized edema: Secondary | ICD-10-CM | POA: Diagnosis not present

## 2020-07-04 DIAGNOSIS — M159 Polyosteoarthritis, unspecified: Secondary | ICD-10-CM | POA: Diagnosis not present

## 2020-07-04 DIAGNOSIS — E876 Hypokalemia: Secondary | ICD-10-CM | POA: Diagnosis not present

## 2020-07-04 DIAGNOSIS — F419 Anxiety disorder, unspecified: Secondary | ICD-10-CM | POA: Diagnosis not present

## 2020-07-04 DIAGNOSIS — G629 Polyneuropathy, unspecified: Secondary | ICD-10-CM | POA: Diagnosis not present

## 2020-07-04 DIAGNOSIS — R0600 Dyspnea, unspecified: Secondary | ICD-10-CM | POA: Diagnosis not present

## 2020-07-04 DIAGNOSIS — E785 Hyperlipidemia, unspecified: Secondary | ICD-10-CM | POA: Diagnosis not present

## 2020-07-04 DIAGNOSIS — R252 Cramp and spasm: Secondary | ICD-10-CM | POA: Diagnosis not present

## 2020-07-04 DIAGNOSIS — I10 Essential (primary) hypertension: Secondary | ICD-10-CM | POA: Diagnosis not present

## 2020-07-04 DIAGNOSIS — R1310 Dysphagia, unspecified: Secondary | ICD-10-CM | POA: Diagnosis not present

## 2020-07-04 DIAGNOSIS — R7989 Other specified abnormal findings of blood chemistry: Secondary | ICD-10-CM | POA: Diagnosis not present

## 2020-07-06 ENCOUNTER — Encounter: Payer: Self-pay | Admitting: Gastroenterology

## 2020-07-06 DIAGNOSIS — H9319 Tinnitus, unspecified ear: Secondary | ICD-10-CM | POA: Diagnosis not present

## 2020-07-06 DIAGNOSIS — H6121 Impacted cerumen, right ear: Secondary | ICD-10-CM | POA: Diagnosis not present

## 2020-07-06 DIAGNOSIS — Z77122 Contact with and (suspected) exposure to noise: Secondary | ICD-10-CM | POA: Diagnosis not present

## 2020-07-06 DIAGNOSIS — H61303 Acquired stenosis of external ear canal, unspecified, bilateral: Secondary | ICD-10-CM | POA: Diagnosis not present

## 2020-07-06 DIAGNOSIS — H903 Sensorineural hearing loss, bilateral: Secondary | ICD-10-CM | POA: Diagnosis not present

## 2020-07-11 DIAGNOSIS — F17200 Nicotine dependence, unspecified, uncomplicated: Secondary | ICD-10-CM | POA: Diagnosis not present

## 2020-07-11 DIAGNOSIS — M8080XA Other osteoporosis with current pathological fracture, unspecified site, initial encounter for fracture: Secondary | ICD-10-CM | POA: Diagnosis not present

## 2020-07-11 DIAGNOSIS — J449 Chronic obstructive pulmonary disease, unspecified: Secondary | ICD-10-CM | POA: Diagnosis not present

## 2020-07-11 DIAGNOSIS — H9193 Unspecified hearing loss, bilateral: Secondary | ICD-10-CM | POA: Diagnosis not present

## 2020-07-11 DIAGNOSIS — Z6823 Body mass index (BMI) 23.0-23.9, adult: Secondary | ICD-10-CM | POA: Diagnosis not present

## 2020-07-11 DIAGNOSIS — R252 Cramp and spasm: Secondary | ICD-10-CM | POA: Diagnosis not present

## 2020-07-11 DIAGNOSIS — R0600 Dyspnea, unspecified: Secondary | ICD-10-CM | POA: Diagnosis not present

## 2020-07-11 DIAGNOSIS — E785 Hyperlipidemia, unspecified: Secondary | ICD-10-CM | POA: Diagnosis not present

## 2020-07-11 DIAGNOSIS — E876 Hypokalemia: Secondary | ICD-10-CM | POA: Diagnosis not present

## 2020-07-11 DIAGNOSIS — N390 Urinary tract infection, site not specified: Secondary | ICD-10-CM | POA: Diagnosis not present

## 2020-07-24 DIAGNOSIS — E876 Hypokalemia: Secondary | ICD-10-CM | POA: Diagnosis not present

## 2020-07-24 DIAGNOSIS — N390 Urinary tract infection, site not specified: Secondary | ICD-10-CM | POA: Diagnosis not present

## 2020-07-24 DIAGNOSIS — H9193 Unspecified hearing loss, bilateral: Secondary | ICD-10-CM | POA: Diagnosis not present

## 2020-07-24 DIAGNOSIS — R0600 Dyspnea, unspecified: Secondary | ICD-10-CM | POA: Diagnosis not present

## 2020-07-24 DIAGNOSIS — G629 Polyneuropathy, unspecified: Secondary | ICD-10-CM | POA: Diagnosis not present

## 2020-07-24 DIAGNOSIS — R7989 Other specified abnormal findings of blood chemistry: Secondary | ICD-10-CM | POA: Diagnosis not present

## 2020-07-24 DIAGNOSIS — R300 Dysuria: Secondary | ICD-10-CM | POA: Diagnosis not present

## 2020-07-24 DIAGNOSIS — R252 Cramp and spasm: Secondary | ICD-10-CM | POA: Diagnosis not present

## 2020-07-24 DIAGNOSIS — Z23 Encounter for immunization: Secondary | ICD-10-CM | POA: Diagnosis not present

## 2020-07-24 DIAGNOSIS — M8080XA Other osteoporosis with current pathological fracture, unspecified site, initial encounter for fracture: Secondary | ICD-10-CM | POA: Diagnosis not present

## 2020-07-24 DIAGNOSIS — E785 Hyperlipidemia, unspecified: Secondary | ICD-10-CM | POA: Diagnosis not present

## 2020-07-24 DIAGNOSIS — J449 Chronic obstructive pulmonary disease, unspecified: Secondary | ICD-10-CM | POA: Diagnosis not present

## 2020-07-25 DIAGNOSIS — I129 Hypertensive chronic kidney disease with stage 1 through stage 4 chronic kidney disease, or unspecified chronic kidney disease: Secondary | ICD-10-CM | POA: Diagnosis not present

## 2020-07-25 DIAGNOSIS — N1832 Chronic kidney disease, stage 3b: Secondary | ICD-10-CM | POA: Diagnosis not present

## 2020-07-25 DIAGNOSIS — R32 Unspecified urinary incontinence: Secondary | ICD-10-CM | POA: Diagnosis not present

## 2020-07-25 DIAGNOSIS — M899 Disorder of bone, unspecified: Secondary | ICD-10-CM | POA: Diagnosis not present

## 2020-07-25 DIAGNOSIS — R609 Edema, unspecified: Secondary | ICD-10-CM | POA: Diagnosis not present

## 2020-07-31 DIAGNOSIS — R0600 Dyspnea, unspecified: Secondary | ICD-10-CM | POA: Diagnosis not present

## 2020-07-31 DIAGNOSIS — H9193 Unspecified hearing loss, bilateral: Secondary | ICD-10-CM | POA: Diagnosis not present

## 2020-07-31 DIAGNOSIS — B9689 Other specified bacterial agents as the cause of diseases classified elsewhere: Secondary | ICD-10-CM | POA: Diagnosis not present

## 2020-07-31 DIAGNOSIS — R5383 Other fatigue: Secondary | ICD-10-CM | POA: Diagnosis not present

## 2020-07-31 DIAGNOSIS — J208 Acute bronchitis due to other specified organisms: Secondary | ICD-10-CM | POA: Diagnosis not present

## 2020-07-31 DIAGNOSIS — R252 Cramp and spasm: Secondary | ICD-10-CM | POA: Diagnosis not present

## 2020-07-31 DIAGNOSIS — G629 Polyneuropathy, unspecified: Secondary | ICD-10-CM | POA: Diagnosis not present

## 2020-07-31 DIAGNOSIS — R7989 Other specified abnormal findings of blood chemistry: Secondary | ICD-10-CM | POA: Diagnosis not present

## 2020-07-31 DIAGNOSIS — E876 Hypokalemia: Secondary | ICD-10-CM | POA: Diagnosis not present

## 2020-07-31 DIAGNOSIS — F17200 Nicotine dependence, unspecified, uncomplicated: Secondary | ICD-10-CM | POA: Diagnosis not present

## 2020-07-31 DIAGNOSIS — J449 Chronic obstructive pulmonary disease, unspecified: Secondary | ICD-10-CM | POA: Diagnosis not present

## 2020-07-31 DIAGNOSIS — M8080XA Other osteoporosis with current pathological fracture, unspecified site, initial encounter for fracture: Secondary | ICD-10-CM | POA: Diagnosis not present

## 2020-07-31 DIAGNOSIS — E785 Hyperlipidemia, unspecified: Secondary | ICD-10-CM | POA: Diagnosis not present

## 2020-08-03 DIAGNOSIS — M8080XA Other osteoporosis with current pathological fracture, unspecified site, initial encounter for fracture: Secondary | ICD-10-CM | POA: Diagnosis not present

## 2020-08-03 DIAGNOSIS — R7989 Other specified abnormal findings of blood chemistry: Secondary | ICD-10-CM | POA: Diagnosis not present

## 2020-08-03 DIAGNOSIS — E876 Hypokalemia: Secondary | ICD-10-CM | POA: Diagnosis not present

## 2020-08-03 DIAGNOSIS — G629 Polyneuropathy, unspecified: Secondary | ICD-10-CM | POA: Diagnosis not present

## 2020-08-03 DIAGNOSIS — F17200 Nicotine dependence, unspecified, uncomplicated: Secondary | ICD-10-CM | POA: Diagnosis not present

## 2020-08-03 DIAGNOSIS — R0600 Dyspnea, unspecified: Secondary | ICD-10-CM | POA: Diagnosis not present

## 2020-08-03 DIAGNOSIS — H9193 Unspecified hearing loss, bilateral: Secondary | ICD-10-CM | POA: Diagnosis not present

## 2020-08-03 DIAGNOSIS — R252 Cramp and spasm: Secondary | ICD-10-CM | POA: Diagnosis not present

## 2020-08-03 DIAGNOSIS — J449 Chronic obstructive pulmonary disease, unspecified: Secondary | ICD-10-CM | POA: Diagnosis not present

## 2020-08-03 DIAGNOSIS — J208 Acute bronchitis due to other specified organisms: Secondary | ICD-10-CM | POA: Diagnosis not present

## 2020-08-03 DIAGNOSIS — E785 Hyperlipidemia, unspecified: Secondary | ICD-10-CM | POA: Diagnosis not present

## 2020-08-03 DIAGNOSIS — B9689 Other specified bacterial agents as the cause of diseases classified elsewhere: Secondary | ICD-10-CM | POA: Diagnosis not present

## 2020-08-04 ENCOUNTER — Other Ambulatory Visit: Payer: Self-pay | Admitting: Nephrology

## 2020-08-04 DIAGNOSIS — R609 Edema, unspecified: Secondary | ICD-10-CM

## 2020-08-04 DIAGNOSIS — Z0000 Encounter for general adult medical examination without abnormal findings: Secondary | ICD-10-CM | POA: Diagnosis not present

## 2020-08-04 DIAGNOSIS — Z139 Encounter for screening, unspecified: Secondary | ICD-10-CM | POA: Diagnosis not present

## 2020-08-04 DIAGNOSIS — Z9181 History of falling: Secondary | ICD-10-CM | POA: Diagnosis not present

## 2020-08-04 DIAGNOSIS — N1832 Chronic kidney disease, stage 3b: Secondary | ICD-10-CM

## 2020-08-04 DIAGNOSIS — E785 Hyperlipidemia, unspecified: Secondary | ICD-10-CM | POA: Diagnosis not present

## 2020-08-04 DIAGNOSIS — Z1331 Encounter for screening for depression: Secondary | ICD-10-CM | POA: Diagnosis not present

## 2020-08-11 DIAGNOSIS — F17200 Nicotine dependence, unspecified, uncomplicated: Secondary | ICD-10-CM | POA: Diagnosis not present

## 2020-08-11 DIAGNOSIS — E785 Hyperlipidemia, unspecified: Secondary | ICD-10-CM | POA: Diagnosis not present

## 2020-08-11 DIAGNOSIS — B9689 Other specified bacterial agents as the cause of diseases classified elsewhere: Secondary | ICD-10-CM | POA: Diagnosis not present

## 2020-08-11 DIAGNOSIS — R252 Cramp and spasm: Secondary | ICD-10-CM | POA: Diagnosis not present

## 2020-08-11 DIAGNOSIS — R0600 Dyspnea, unspecified: Secondary | ICD-10-CM | POA: Diagnosis not present

## 2020-08-11 DIAGNOSIS — G629 Polyneuropathy, unspecified: Secondary | ICD-10-CM | POA: Diagnosis not present

## 2020-08-11 DIAGNOSIS — R7989 Other specified abnormal findings of blood chemistry: Secondary | ICD-10-CM | POA: Diagnosis not present

## 2020-08-11 DIAGNOSIS — M8080XA Other osteoporosis with current pathological fracture, unspecified site, initial encounter for fracture: Secondary | ICD-10-CM | POA: Diagnosis not present

## 2020-08-11 DIAGNOSIS — H9193 Unspecified hearing loss, bilateral: Secondary | ICD-10-CM | POA: Diagnosis not present

## 2020-08-11 DIAGNOSIS — J208 Acute bronchitis due to other specified organisms: Secondary | ICD-10-CM | POA: Diagnosis not present

## 2020-08-11 DIAGNOSIS — E876 Hypokalemia: Secondary | ICD-10-CM | POA: Diagnosis not present

## 2020-08-11 DIAGNOSIS — J449 Chronic obstructive pulmonary disease, unspecified: Secondary | ICD-10-CM | POA: Diagnosis not present

## 2020-08-25 DIAGNOSIS — B9689 Other specified bacterial agents as the cause of diseases classified elsewhere: Secondary | ICD-10-CM | POA: Diagnosis not present

## 2020-08-25 DIAGNOSIS — E876 Hypokalemia: Secondary | ICD-10-CM | POA: Diagnosis not present

## 2020-08-25 DIAGNOSIS — R252 Cramp and spasm: Secondary | ICD-10-CM | POA: Diagnosis not present

## 2020-08-25 DIAGNOSIS — J208 Acute bronchitis due to other specified organisms: Secondary | ICD-10-CM | POA: Diagnosis not present

## 2020-08-25 DIAGNOSIS — J449 Chronic obstructive pulmonary disease, unspecified: Secondary | ICD-10-CM | POA: Diagnosis not present

## 2020-08-25 DIAGNOSIS — G629 Polyneuropathy, unspecified: Secondary | ICD-10-CM | POA: Diagnosis not present

## 2020-08-25 DIAGNOSIS — R0600 Dyspnea, unspecified: Secondary | ICD-10-CM | POA: Diagnosis not present

## 2020-08-25 DIAGNOSIS — H9193 Unspecified hearing loss, bilateral: Secondary | ICD-10-CM | POA: Diagnosis not present

## 2020-08-25 DIAGNOSIS — R7989 Other specified abnormal findings of blood chemistry: Secondary | ICD-10-CM | POA: Diagnosis not present

## 2020-08-25 DIAGNOSIS — E785 Hyperlipidemia, unspecified: Secondary | ICD-10-CM | POA: Diagnosis not present

## 2020-08-25 DIAGNOSIS — F17200 Nicotine dependence, unspecified, uncomplicated: Secondary | ICD-10-CM | POA: Diagnosis not present

## 2020-08-25 DIAGNOSIS — M8080XA Other osteoporosis with current pathological fracture, unspecified site, initial encounter for fracture: Secondary | ICD-10-CM | POA: Diagnosis not present

## 2020-08-30 ENCOUNTER — Telehealth: Payer: Self-pay

## 2020-08-30 NOTE — Telephone Encounter (Signed)
Reviewed records received from Dr. Marguerita Beards office. Appears pt had colonoscopy with Dr. Redmond Baseman and mentioned results were "normal" but did not provide these records. Called pt to inquire further. States Dr. Redmond Baseman has now retired. Had her colonoscopy and partial colectomy at [facility]. Asked pt to request colonoscopy report with path report, op report and path report from partial colectomy be faxed to our office. Denies ever having EGD. Will await records.

## 2020-09-04 ENCOUNTER — Ambulatory Visit: Payer: Medicare Other | Admitting: Gastroenterology

## 2020-09-12 ENCOUNTER — Encounter: Payer: Self-pay | Admitting: Gastroenterology

## 2020-09-12 ENCOUNTER — Ambulatory Visit (INDEPENDENT_AMBULATORY_CARE_PROVIDER_SITE_OTHER): Payer: Medicare Other | Admitting: Gastroenterology

## 2020-09-12 VITALS — BP 120/74 | HR 100 | Ht 67.0 in | Wt 145.0 lb

## 2020-09-12 DIAGNOSIS — I10 Essential (primary) hypertension: Secondary | ICD-10-CM

## 2020-09-12 DIAGNOSIS — R1310 Dysphagia, unspecified: Secondary | ICD-10-CM | POA: Diagnosis not present

## 2020-09-12 DIAGNOSIS — R609 Edema, unspecified: Secondary | ICD-10-CM

## 2020-09-12 DIAGNOSIS — I509 Heart failure, unspecified: Secondary | ICD-10-CM

## 2020-09-12 MED ORDER — PANTOPRAZOLE SODIUM 40 MG PO TBEC: 40.0000 mg | DELAYED_RELEASE_TABLET | Freq: Two times a day (BID) | ORAL | 2 refills | Status: DC

## 2020-09-12 NOTE — Progress Notes (Signed)
Referring Provider: [name], MD Primary Care Physician:  [name], MD  Reason for Consultation:  Dysphagia   IMPRESSION:  Dysphagia with solids and liquids 3 pillow orthopnea with history of CHF Family history of pancreatic cancer (mother, sister, brother) Colonoscopy 2018 with Dr. Redmond Baseman  EGD with esophageal biopsies recommended to evaluate for ring, web, stricture, malignancy or esophagitis.  Will await cardiology clearance prior to scheduling. Proceed with varium esophagram in the meantime. Empiric trial of pantoprazole 40 mg BID.   Multiple first degree relatives with pancreatic cancer: Plan outpatient MRI/MRCP for pancreatic cancer screening after the dysphagia is evaluated. Will encourage genetic counseling.     PLAN: Start pantoprazole 40 mg BID Barium esophagram EGD with possible dilation after clearance by cardiology  Please see the "Patient Instructions" section for addition details about the plan.  HPI: Tina George is a 74 y.o. female referred by Dr. [name] for dysphagia to solids and liquids. The history is obtained through the patient, her daughter who accompanies her to this appointment, and review of referral records. She has peripheral neuropathy, congestive heart failure, hypertension, COPD, anxiety, depression, stage 3 chronic kidney disease, hyperlipidemia, osteoarthritis. History of prior colectomy.   Gradual onset of dysphagia to solids and liquids over the last 6 months. Localized to the sternal notch and to the xiphoid process. Associated with brash and chest pain. No odynophagia, sore throat, neck pain, dysphonia, abdominal pain, change in bowel habits. Concurrent difficulties with cough and shortness of breath during this time. She attributes these symptoms to asthma but has not find inhalers or steroids helpful. She now has 3 pillow orthopnea. Denies chest pain.   No recent abdominal imaging. No prior EGD. Recently evaluated by ENT in [place] for  hearing loss. He did not evaluate her swallowing, he told her to see GI.  Congestive heart failure requiring treatment with Lasix 4-5 years ago. Although she has ongoing intermittent lower extremity edema.  Colonoscopy with Dr. Redmond Baseman in 2018. Repeat recommended in 2023.   Mother, brother, and sister with pancreatic cancer. No known family history of colon cancer or polyps. No family history of uterine/endometrial cancer or gastric/stomach cancer.   Past Medical History:  Diagnosis Date  . COPD (chronic obstructive pulmonary disease) ([place])   . Depression   . Dyslipidemia   . GERD (gastroesophageal reflux disease)   . High blood pressure   . Hypokalemia   . Muscle cramps   . Psoriasis     Past Surgical History:  Procedure Laterality Date  . ABDOMINAL HYSTERECTOMY    . APPENDECTOMY    . arthroscopic shoulder surgery    . BACK SURGERY    . CATARACT EXTRACTION, BILATERAL    . CHOLECYSTECTOMY    . MASTECTOMY    . MASTOIDECTOMY    . PARTIAL COLECTOMY      Current Outpatient Medications  Medication Sig Dispense Refill  . albuterol (PROVENTIL HFA;VENTOLIN HFA) 108 (90 Base) MCG/ACT inhaler     . amLODipine (NORVASC) 5 MG tablet Take 5 mg by mouth daily.    . budesonide-formoterol (SYMBICORT) 160-4.5 MCG/ACT inhaler Inhale 2 puffs into the lungs 2 (two) times daily.    . famotidine (PEPCID) 20 MG tablet Take 20 mg by mouth 2 (two) times daily.    . furosemide (LASIX) 20 MG tablet Take 20 mg by mouth daily as needed.     . gabapentin (NEURONTIN) 300 MG capsule Take 300 mg by mouth at bedtime.    Marland Kitchen ibuprofen (ADVIL,MOTRIN)  200 MG tablet Take 200 mg by mouth.    Marland Kitchen PARoxetine (PAXIL) 30 MG tablet Take 30 mg by mouth daily.    . Potassium 99 MG TABS Take 2 tablets by mouth daily.    Marland Kitchen rOPINIRole (REQUIP) 1 MG tablet Take 1.5 mg by mouth at bedtime.    . rosuvastatin (CRESTOR) 10 MG tablet Take 10 mg by mouth daily.     No current facility-administered medications for this visit.     Allergies as of 09/12/2020 - Review Complete 09/12/2020  Allergen Reaction Noted  . Aspirin  08/17/2018  . Codeine  08/17/2018  . Levaquin [levofloxacin in d5w]  08/17/2018    Family History  Problem Relation Age of Onset  . Pancreatic cancer Mother   . Hypertension Father   . Heart disease Father   . Pancreatic cancer Sister   . Pancreatic cancer Brother     Social History   Socioeconomic History  . Marital status: Widowed    Spouse name: Not on file  . Number of children: 2  . Years of education: Not on file  . Highest education level: Not on file  Occupational History  . Not on file  Tobacco Use  . Smoking status: Current Every Day Smoker    Packs/day: 0.50  . Smokeless tobacco: Never Used  Substance and Sexual Activity  . Alcohol use: Never  . Drug use: Never  . Sexual activity: Not on file  Other Topics Concern  . Not on file  Social History Narrative  . Not on file   Social Determinants of Health   Financial Resource Strain:   . Difficulty of Paying Living Expenses: Not on file  Food Insecurity:   . Worried About Charity fundraiser in the Last Year: Not on file  . Ran Out of Food in the Last Year: Not on file  Transportation Needs:   . Lack of Transportation (Medical): Not on file  . Lack of Transportation (Non-Medical): Not on file  Physical Activity:   . Days of Exercise per Week: Not on file  . Minutes of Exercise per Session: Not on file  Stress:   . Feeling of Stress : Not on file  Social Connections:   . Frequency of Communication with Friends and Family: Not on file  . Frequency of Social Gatherings with Friends and Family: Not on file  . Attends Religious Services: Not on file  . Active Member of Clubs or Organizations: Not on file  . Attends Archivist Meetings: Not on file  . Marital Status: Not on file  Intimate Partner Violence:   . Fear of Current or Ex-Partner: Not on file  . Emotionally Abused: Not on file  .  Physically Abused: Not on file  . Sexually Abused: Not on file    Review of Systems: 12 system ROS is negative except as noted above with the addition of arthritis, fatigue, muscle pains, and lower extremity edema.   Physical Exam: General:   Alert,  well-nourished, pleasant and cooperative in NAD. Hearing loss. Unable to lie flat for the examination without severe coughing and shortness of breath.  Head:  Normocephalic and atraumatic. Eyes:  Sclera clear, no icterus.   Conjunctiva pink. Ears:  Normal auditory acuity. Nose:  No deformity, discharge,  or lesions. Mouth:  No deformity or lesions.   Neck:  Supple; no masses or thyromegaly. Lungs:  Clear throughout to auscultation.   No wheezes. Heart:  Regular rate and rhythm; no  murmurs. Abdomen:  Soft, nontender, nondistended, normal bowel sounds, no rebound or guarding. No hepatosplenomegaly.   Rectal:  Deferred  Msk:  Symmetrical. No boney deformities LAD: No inguinal or umbilical LAD Extremities:  No clubbing or edema. Neurologic:  Alert and  oriented x4;  grossly nonfocal Skin:  Intact without significant lesions or rashes. Psych:  Alert and cooperative. Normal mood and affect.     Jennifermarie Franzen L. Tarri Glenn, MD, MPH 09/12/2020, 2:29 PM

## 2020-09-12 NOTE — Patient Instructions (Addendum)
I have recommended start pantoprazole 40 mg twice daily, taken 30-60 minutes prior to meals.  I have recommended an upper endoscopy after you are cleared by cardiology.   CARDIOLOGY - We will refer you to a cardiologist in [place] to establish care. Once established, we will obtain clearance from your cardiologist. Once you have been cleared by your cardiologist, you will receive a call to schedule your endoscopy.  In the meantime, a barium esophagram will help [place] better understand your swallowing difficulty.  You have been scheduled for a Barium Esophogram at [facility] (1st floor of the hospital) on 09/26/20 at 10:30am. Please arrive 15 minutes prior to your appointment for registration. Make certain not to have anything to eat or drink 3 hours prior to your test. If you need to reschedule for any reason, please contact radiology at [phone] to do so. ________________________________________________________________________________________________________________________________________________________________________ A barium swallow is an examination that concentrates on views of the esophagus. This tends to be a double contrast exam (barium and two liquids which, when combined, create a gas to distend the wall of the oesophagus) or single contrast (non-ionic iodine based). The study is usually tailored to your symptoms so a good history is essential. Attention is paid during the study to the form, structure and configuration of the esophagus, looking for functional disorders (such as aspiration, dysphagia, achalasia, motility and reflux) EXAMINATION You may be asked to change into a gown, depending on the type of swallow being performed. A radiologist and radiographer will perform the procedure. The radiologist will advise you of the type of contrast selected for your procedure and direct you during the exam. You will be asked to stand, sit or lie in several different positions and  to hold a small amount of fluid in your mouth before being asked to swallow while the imaging is performed .In some instances you may be asked to swallow barium coated marshmallows to assess the motility of a solid food bolus. The exam can be recorded as a digital or video fluoroscopy procedure. POST PROCEDURE It will take 1-2 days for the barium to pass through your system. To facilitate this, it is important, unless otherwise directed, to increase your fluids for the next 24-48hrs and to resume your normal diet.  This test typically takes about 30 minutes to perform. __________________________________________________________________________________   If you are age 71 or older, your body mass index should be between 23-30. Your Body mass index is 22.71 kg/m. If this is out of the aforementioned range listed, please consider follow up with your Primary Care Provider.  Thank you for trusting me with your gastrointestinal care!    Thornton Park, MD, MPH

## 2020-09-13 ENCOUNTER — Other Ambulatory Visit: Payer: Self-pay

## 2020-09-13 DIAGNOSIS — K219 Gastro-esophageal reflux disease without esophagitis: Secondary | ICD-10-CM | POA: Insufficient documentation

## 2020-09-13 DIAGNOSIS — E785 Hyperlipidemia, unspecified: Secondary | ICD-10-CM | POA: Insufficient documentation

## 2020-09-13 DIAGNOSIS — R252 Cramp and spasm: Secondary | ICD-10-CM | POA: Insufficient documentation

## 2020-09-13 DIAGNOSIS — J449 Chronic obstructive pulmonary disease, unspecified: Secondary | ICD-10-CM | POA: Insufficient documentation

## 2020-09-13 DIAGNOSIS — E876 Hypokalemia: Secondary | ICD-10-CM | POA: Insufficient documentation

## 2020-09-13 DIAGNOSIS — F32A Depression, unspecified: Secondary | ICD-10-CM | POA: Insufficient documentation

## 2020-09-13 DIAGNOSIS — L409 Psoriasis, unspecified: Secondary | ICD-10-CM | POA: Insufficient documentation

## 2020-09-13 DIAGNOSIS — I10 Essential (primary) hypertension: Secondary | ICD-10-CM | POA: Insufficient documentation

## 2020-09-14 ENCOUNTER — Encounter: Payer: Self-pay | Admitting: Cardiology

## 2020-09-14 ENCOUNTER — Telehealth: Payer: Self-pay

## 2020-09-14 ENCOUNTER — Other Ambulatory Visit: Payer: Self-pay

## 2020-09-14 ENCOUNTER — Ambulatory Visit (INDEPENDENT_AMBULATORY_CARE_PROVIDER_SITE_OTHER): Payer: Medicare Other | Admitting: Cardiology

## 2020-09-14 VITALS — BP 118/64 | HR 96 | Ht 66.0 in | Wt 146.6 lb

## 2020-09-14 DIAGNOSIS — F17200 Nicotine dependence, unspecified, uncomplicated: Secondary | ICD-10-CM | POA: Diagnosis not present

## 2020-09-14 DIAGNOSIS — R5382 Chronic fatigue, unspecified: Secondary | ICD-10-CM | POA: Diagnosis not present

## 2020-09-14 DIAGNOSIS — J208 Acute bronchitis due to other specified organisms: Secondary | ICD-10-CM | POA: Diagnosis not present

## 2020-09-14 DIAGNOSIS — R0683 Snoring: Secondary | ICD-10-CM | POA: Diagnosis not present

## 2020-09-14 DIAGNOSIS — R600 Localized edema: Secondary | ICD-10-CM | POA: Diagnosis not present

## 2020-09-14 DIAGNOSIS — G629 Polyneuropathy, unspecified: Secondary | ICD-10-CM | POA: Diagnosis not present

## 2020-09-14 DIAGNOSIS — R072 Precordial pain: Secondary | ICD-10-CM

## 2020-09-14 DIAGNOSIS — M8080XA Other osteoporosis with current pathological fracture, unspecified site, initial encounter for fracture: Secondary | ICD-10-CM | POA: Diagnosis not present

## 2020-09-14 DIAGNOSIS — R400 Somnolence: Secondary | ICD-10-CM

## 2020-09-14 DIAGNOSIS — E876 Hypokalemia: Secondary | ICD-10-CM | POA: Diagnosis not present

## 2020-09-14 DIAGNOSIS — Z79899 Other long term (current) drug therapy: Secondary | ICD-10-CM | POA: Diagnosis not present

## 2020-09-14 DIAGNOSIS — H9193 Unspecified hearing loss, bilateral: Secondary | ICD-10-CM | POA: Diagnosis not present

## 2020-09-14 DIAGNOSIS — B9689 Other specified bacterial agents as the cause of diseases classified elsewhere: Secondary | ICD-10-CM | POA: Diagnosis not present

## 2020-09-14 DIAGNOSIS — M79605 Pain in left leg: Secondary | ICD-10-CM

## 2020-09-14 DIAGNOSIS — E785 Hyperlipidemia, unspecified: Secondary | ICD-10-CM | POA: Diagnosis not present

## 2020-09-14 DIAGNOSIS — R011 Cardiac murmur, unspecified: Secondary | ICD-10-CM

## 2020-09-14 DIAGNOSIS — M79604 Pain in right leg: Secondary | ICD-10-CM

## 2020-09-14 DIAGNOSIS — J449 Chronic obstructive pulmonary disease, unspecified: Secondary | ICD-10-CM | POA: Diagnosis not present

## 2020-09-14 DIAGNOSIS — R7989 Other specified abnormal findings of blood chemistry: Secondary | ICD-10-CM | POA: Diagnosis not present

## 2020-09-14 DIAGNOSIS — R0602 Shortness of breath: Secondary | ICD-10-CM

## 2020-09-14 DIAGNOSIS — R0789 Other chest pain: Secondary | ICD-10-CM | POA: Diagnosis not present

## 2020-09-14 DIAGNOSIS — R0600 Dyspnea, unspecified: Secondary | ICD-10-CM | POA: Diagnosis not present

## 2020-09-14 DIAGNOSIS — N1832 Chronic kidney disease, stage 3b: Secondary | ICD-10-CM | POA: Diagnosis not present

## 2020-09-14 MED ORDER — FUROSEMIDE 20 MG PO TABS
20.0000 mg | ORAL_TABLET | ORAL | 3 refills | Status: DC
Start: 2020-09-14 — End: 2020-12-15

## 2020-09-14 MED ORDER — DILTIAZEM HCL ER COATED BEADS 120 MG PO CP24: 120.0000 mg | ORAL_CAPSULE | Freq: Every day | ORAL | 3 refills | Status: DC

## 2020-09-14 NOTE — Telephone Encounter (Signed)
Pt scheduled for NP appt with [facility] 09/14/20. Will continue to follow up re: ongoing establishment of care and will send cardiac clearance letter when appropriate.  [place]: [facility]  Document: Referral Other records requested: All records pertaining to referral in Epic

## 2020-09-14 NOTE — Telephone Encounter (Signed)
Records received from [facility] and placed on Dr. Tarri Glenn desk for her review.  FAXED [place]  Document: Signed Medical Records Release Records requested: Previous colonoscopy report, path report, op report from partial colectomy (Dr. [facility] Baseman - retired; office notes unavailable) and path report.  All above requested information has been faxed successfully to [employer] listed above. Documents and fax confirmation have been placed in the faxed file for future reference.

## 2020-09-14 NOTE — Progress Notes (Signed)
Cardiology Office Note:    Date:  09/15/2020   ID:  Tina George, DOB [birth year], MRN [id number]  PCP:  [name], MD  Cardiologist:  Berniece Salines, DO  Electrophysiologist:  None   Referring MD: Thornton Park, MD   " I am doing well"   History of Present Illness:    Tina George is a 74 y.o. female with a hx of Dyslipidemia, hypertension, depression, smoker, family history of premature coronary artery disease with myocardial infarction in her sister at a young age, her dad as well as her mom in her early 63s.  Presents at the request of her gastroenterologist.  She tells me that they were planning a endoscopy and the patient could not lie flat as she was significantly short of breath.  This was concerning for her gastroenterologist and asked the patient to see cardiology.  The patient tells me that she does have some intermittent chest discomfort is midsternal nonradiating is dull at times lasts for few seconds.  She quantified this about a 7 out of 10.  Nothing makes it better or worse resolved by self.  This is new over the last few months.  She admits to associated shortness of breath.  No lightheadedness no dizziness In addition the patient tells me that she does have some leg claudication when she walks and tells me that significantly painful.  One of the biggest problem that her daughter brings alizapride the patient snores and usually is sleeping all the time during the day in all areas.  Past Medical History:  Diagnosis Date  . COPD (chronic obstructive pulmonary disease) ([place])   . Depression   . Dyslipidemia   . GERD (gastroesophageal reflux disease)   . High blood pressure   . Hypokalemia   . Muscle cramps   . Psoriasis     Past Surgical History:  Procedure Laterality Date  . ABDOMINAL HYSTERECTOMY    . APPENDECTOMY    . arthroscopic shoulder surgery    . BACK SURGERY    . CATARACT EXTRACTION, BILATERAL    . CHOLECYSTECTOMY    . MASTECTOMY    . MASTOIDECTOMY     . PARTIAL COLECTOMY      Current Medications: Current Meds  Medication Sig  . albuterol (PROVENTIL HFA;VENTOLIN HFA) 108 (90 Base) MCG/ACT inhaler   . ALPRAZolam (XANAX) 0.25 MG tablet Take 0.25 mg by mouth every 6 (six) hours as needed.  Marland Kitchen atorvastatin (LIPITOR) 40 MG tablet Take 40 mg by mouth daily.  . budesonide-formoterol (SYMBICORT) 160-4.5 MCG/ACT inhaler Inhale 2 puffs into the lungs 2 (two) times daily.  Marland Kitchen doxycycline (VIBRA-TABS) 100 MG tablet Take 100 mg by mouth 2 (two) times daily.  . famotidine (PEPCID) 20 MG tablet Take 20 mg by mouth 2 (two) times daily.  . fluconazole (DIFLUCAN) 150 MG tablet Take 150 mg by mouth daily.  . furosemide (LASIX) 20 MG tablet Take 1 tablet (20 mg total) by mouth as directed. Take 2 tablets(40 mg) by mouth every Monday, Wednesday, Friday and Sunday. Take 1 tablet (20 mg) by mouth every Tuesday, Thursday and Saturday.  . gabapentin (NEURONTIN) 300 MG capsule Take 300 mg by mouth at bedtime.  . gabapentin (NEURONTIN) 600 MG tablet Take 600 mg by mouth at bedtime.  Marland Kitchen ibuprofen (ADVIL,MOTRIN) 200 MG tablet Take 200 mg by mouth.  Marland Kitchen ipratropium-albuterol (DUONEB) 0.5-2.5 (3) MG/3ML SOLN Take 3 mLs by nebulization every 4 (four) hours as needed.  . nitrofurantoin, macrocrystal-monohydrate, (MACROBID) 100 MG capsule  Take 100 mg by mouth 2 (two) times daily.  . pantoprazole (PROTONIX) 40 MG tablet Take 1 tablet (40 mg total) by mouth 2 (two) times daily.  Marland Kitchen PARoxetine (PAXIL) 30 MG tablet Take 30 mg by mouth daily.  . Potassium 99 MG TABS Take 2 tablets by mouth daily.  . predniSONE (DELTASONE) 10 MG tablet Take by mouth.  Marland Kitchen rOPINIRole (REQUIP) 1 MG tablet Take 1.5 mg by mouth at bedtime.  . rosuvastatin (CRESTOR) 10 MG tablet Take 10 mg by mouth daily.  Marland Kitchen spironolactone (ALDACTONE) 50 MG tablet Take 50 mg by mouth daily.  Marland Kitchen sulfamethoxazole-trimethoprim (BACTRIM DS) 800-160 MG tablet Take 1 tablet by mouth 2 (two) times daily.  . [DISCONTINUED]  amLODipine (NORVASC) 5 MG tablet Take 5 mg by mouth daily.  . [DISCONTINUED] furosemide (LASIX) 20 MG tablet Take 20 mg by mouth daily as needed.      Allergies:   Aspirin, Codeine, and Levaquin [levofloxacin in d5w]   Social History   Socioeconomic History  . Marital status: Widowed    Spouse name: Not on file  . Number of children: 2  . Years of education: Not on file  . Highest education level: Not on file  Occupational History  . Not on file  Tobacco Use  . Smoking status: Current Every Day Smoker    Packs/day: 0.50  . Smokeless tobacco: Never Used  Substance and Sexual Activity  . Alcohol use: Never  . Drug use: Never  . Sexual activity: Not on file  Other Topics Concern  . Not on file  Social History Narrative  . Not on file   Social Determinants of Health   Financial Resource Strain:   . Difficulty of Paying Living Expenses: Not on file  Food Insecurity:   . Worried About Charity fundraiser in the Last Year: Not on file  . Ran Out of Food in the Last Year: Not on file  Transportation Needs:   . Lack of Transportation (Medical): Not on file  . Lack of Transportation (Non-Medical): Not on file  Physical Activity:   . Days of Exercise per Week: Not on file  . Minutes of Exercise per Session: Not on file  Stress:   . Feeling of Stress : Not on file  Social Connections:   . Frequency of Communication with Friends and Family: Not on file  . Frequency of Social Gatherings with Friends and Family: Not on file  . Attends Religious Services: Not on file  . Active Member of Clubs or Organizations: Not on file  . Attends Archivist Meetings: Not on file  . Marital Status: Not on file     Family History: The patient's family history includes Heart disease in her father; Hypertension in her father; Pancreatic cancer in her brother, mother, and sister.  ROS:   Review of Systems  Constitution: Negative for decreased appetite, fever and weight gain.  HENT:  Negative for congestion, ear discharge, hoarse voice and sore throat.   Eyes: Negative for discharge, redness, vision loss in right eye and visual halos.  Cardiovascular: Reports chest pain shortness of breath.  Negative for palpitations, orthopnea and leg edema respiratory: Negative for cough, hemoptysis, shortness of breath and snoring.   Endocrine: Negative for heat intolerance and polyphagia.  Hematologic/Lymphatic: Negative for bleeding problem. Does not bruise/bleed easily.  Skin: Negative for flushing, nail changes, rash and suspicious lesions.  Musculoskeletal: Negative for arthritis, joint pain, muscle cramps, myalgias, neck pain and stiffness.  Gastrointestinal: Negative for abdominal pain, bowel incontinence, diarrhea and excessive appetite.  Genitourinary: Negative for decreased libido, genital sores and incomplete emptying.  Neurological: Negative for brief paralysis, focal weakness, headaches and loss of balance.  Psychiatric/Behavioral: Negative for altered mental status, depression and suicidal ideas.  Allergic/Immunologic: Negative for HIV exposure and persistent infections.    EKGs/Labs/Other Studies Reviewed:    The following studies were reviewed today:   EKG:  The ekg ordered today demonstrates sinus rhythm, heart rate 96 bpm with frequent PACs.  P wave morphology suggesting right atrial enlargement with nonspecific ST changes and some wandering baseline.  Recent Labs: No results found for requested labs within last 8760 hours.  Recent Lipid Panel No results found for: CHOL, TRIG, HDL, CHOLHDL, VLDL, LDLCALC, LDLDIRECT  Physical Exam:    VS:  BP 118/64 (BP Location: Left Arm)   Pulse 96   Ht _0  (1.676 m)   Wt 146 lb 9.6 oz (66.5 kg)   SpO2 96%   BMI 23.66 kg/m     Wt Readings from Last 3 Encounters:  09/14/20 146 lb 9.6 oz (66.5 kg)  09/12/20 145 lb (65.8 kg)  05/05/19 145 lb (65.8 kg)     GEN: Well nourished, well developed in no acute  distress HEENT: Normal NECK: No JVD; No carotid bruits LYMPHATICS: No lymphadenopathy CARDIAC: S1S2 noted,RRR, 2/6 diastolic murmurs, rubs, gallops RESPIRATORY:  Clear to auscultation without rales, wheezing or rhonchi  ABDOMEN: Soft, non-tender, non-distended, +bowel sounds, no guarding. EXTREMITIES: No edema, No cyanosis, no clubbing MUSCULOSKELETAL:  No deformity  SKIN: Warm and dry NEUROLOGIC:  Alert and oriented x 3, non-focal PSYCHIATRIC:  Normal affect, good insight  ASSESSMENT:    1. Precordial pain   2. Other chest pain   3. Bilateral leg edema   4. SOB (shortness of breath)   5. Daytime somnolence   6. Murmur   7. Medication management   8. Snoring   9. Pain in both lower extremities    PLAN:    Her chest pain is concerning giving her intermittent hives risk factor (hypertension, smoker, premature family history of coronary artery disease, hyperlipidemia) I like to pursue an ischemic evaluation in this patient.  We discussed multiple testing modalities shared decision coronary CTA at this time will be appropriate for this patient.  I have discussed the process of the testing she has no IV contrast allergy and she is agreeable to proceed with this testing.  In addition the patient reports some claudication on ambulation I like to start with the ankle-brachial index and then if needed we will proceed with further testing in this regards to rule out peripheral artery disease.  She has a murmur on auscultation which is mid ejection systolic suggestive of aortic stenosis or sclerosis.  An echocardiogram will be appropriate to assess for any valvular abnormalities.  She snores as well as has reported some daytime somnolence, sleep study is appropriate at this time.  Be understand if the patient have sleep apnea and be able to modify this risk for potential complication for any cardiovascular event. We will stop amlodipine and started patient on Cardizem which will help her heart  rate as well as blood pressure. She has a bilateral leg edema we will continue with her Lasix 20 mg but increase this dosing on Monday Wednesdays and Fridays and Sunday to 40 mg daily.  With 20 mg on other days.  Continue potassium supplement. BMP will be done today to assess kidney function.  The patient is in agreement with the above plan. The patient left the office in stable condition.  The patient will follow up in 3 months or sooner if needed.   Medication Adjustments/Labs and Tests Ordered: Current medicines are reviewed at length with the patient today.  Concerns regarding medicines are outlined above.  Orders Placed This Encounter  Procedures  . CT CORONARY MORPH W/CTA COR W/SCORE W/CA W/CM &/OR WO/CM  . Basic metabolic panel  . EKG 12-Lead  . ECHOCARDIOGRAM COMPLETE  . Split night study  . VAS [place] ABI WITH/WO TBI   Meds ordered this encounter  Medications  . diltiazem (CARDIZEM CD) 120 MG 24 hr capsule    Sig: Take 1 capsule (120 mg total) by mouth daily.    Dispense:  90 capsule    Refill:  3  . furosemide (LASIX) 20 MG tablet    Sig: Take 1 tablet (20 mg total) by mouth as directed. Take 2 tablets(40 mg) by mouth every Monday, Wednesday, Friday and Sunday. Take 1 tablet (20 mg) by mouth every Tuesday, Thursday and Saturday.    Dispense:  180 tablet    Refill:  3    Patient Instructions  Medication Instructions:  1)  Stop Amlodipine   2) Start Diltiazem (Cardiem) 120 mg daily  3) Increase Lasix to 2 tablets (40 mg) by mouth every Monday, Wednesday, Friday and Sunday, Take 1 tablet (20 mg) by mouth every Tuesday, Thursday and Saturday   *If you need a refill on your cardiac medications before your next appointment, please call your pharmacy*   Lab Work: Your physician recommends that you return for lab work 1 week before your CT  If you have labs (blood work) drawn today and your tests are completely normal, you will receive your results only by: Marland Kitchen MyChart  Message (if you have MyChart) OR . A paper copy in the mail If you have any lab test that is abnormal or we need to change your treatment, we will call you to review the results.   Testing/Procedures: Your physician has requested that you have an echocardiogram. Echocardiography is a painless test that uses sound waves to create images of your heart. It provides your doctor with information about the size and shape of your heart and how well your heart's chambers and valves are working. This procedure takes approximately one hour. There are no restrictions for this procedure.  Your physician has requested that you have an ankle brachial index (ABI). During this test an ultrasound and blood pressure cuff are used to evaluate the arteries that supply the arms and legs with blood. Allow thirty minutes for this exam. There are no restrictions or special instructions.  Your physician has recommended that you have a sleep study. This test records several body functions during sleep, including: brain activity, eye movement, oxygen and carbon dioxide blood levels, heart rate and rhythm, breathing rate and rhythm, the flow of air through your mouth and nose, snoring, body muscle movements, and chest and belly movement.   Follow-Up: At [facility], you and your health needs are our priority.  As part of our continuing mission to provide you with exceptional heart care, we have created designated Provider Care Teams.  These Care Teams include your primary Cardiologist (physician) and Advanced Practice Providers (APPs -  Physician Assistants and Nurse Practitioners) who all work together to provide you with the care you need, when you need it.  We recommend signing up for the patient portal  called "MyChart".  Sign up information is provided on this After Visit Summary.  MyChart is used to connect with patients for Virtual Visits (Telemedicine).  Patients are able to view lab/test results, encounter notes,  upcoming appointments, etc.  Non-urgent messages can be sent to your provider as well.   To learn more about what you can do with MyChart, go to [email or web address].    Your next appointment:   12 week(s)  The format for your next appointment:   In Person  Provider:   Berniece Salines, DO   Other Instructions Your cardiac CT will be scheduled at one of the below locations:   [facility] [address] [place], [place] [address] [phone]  If scheduled at [facility], please arrive at the [facility] main entrance of [facility] 30 minutes prior to test start time. Proceed to the [facility] Radiology Department (first floor) to check-in and test prep.   Please follow these instructions carefully (unless otherwise directed):   On the Night Before the Test: . Be sure to Drink plenty of water. . Do not consume any caffeinated/decaffeinated beverages or chocolate 12 hours prior to your test. . Do not take any antihistamines 12 hours prior to your test.  On the Day of the Test: . Drink plenty of water. Do not drink any water within one hour of the test. . Do not eat any food 4 hours prior to the test. . You may take your regular medications prior to the test.  . Take Diltiazem 2 hours before test  . HOLD Furosemide  morning of the test. . FEMALES- please wear underwire-free bra if available       After the Test: . Drink plenty of water. . After receiving IV contrast, you may experience a mild flushed feeling. This is normal. . On occasion, you may experience a mild rash up to 24 hours after the test. This is not dangerous. If this occurs, you can take Benadryl 25 mg and increase your fluid intake. . If you experience trouble breathing, this can be serious. If it is severe call 911 IMMEDIATELY. If it is mild, please call our office.   Once we have confirmed authorization from your insurance company, we will call you to set up a date and time  for your test. Based on how quickly your insurance processes prior authorizations requests, please allow up to 4 weeks to be contacted for scheduling your Cardiac CT appointment. Be advised that routine Cardiac CT appointments could be scheduled as many as 8 weeks after your provider has ordered it.  For non-scheduling related questions, please contact the cardiac imaging nurse navigator should you have any questions/concerns: Marchia Bond, Cardiac Imaging Nurse Navigator Burley Saver, Interim Cardiac Imaging Nurse [place] and Vascular Services Direct Office Dial: [phone]   For scheduling needs, including cancellations and rescheduling, please call [place], [phone].       Adopting a Healthy Lifestyle.  Know what a healthy weight is for you (roughly BMI <25) and aim to maintain this   Aim for 7+ servings of fruits and vegetables daily   65-80+ fluid ounces of water or unsweet tea for healthy kidneys   Limit to max 1 drink of alcohol per day; avoid smoking/tobacco   Limit animal fats in diet for cholesterol and heart health - choose grass fed whenever available   Avoid highly processed foods, and foods high in saturated/trans fats   Aim for low stress -  take time to unwind and care for your mental health   Aim for 150 min of moderate intensity exercise weekly for heart health, and weights twice weekly for bone health   Aim for 7-9 hours of sleep daily   When it comes to diets, agreement about the perfect plan isnt easy to find, even among the experts. Experts at the [employer] developed an idea known as the Healthy Eating Plate. Just imagine a plate divided into logical, healthy portions.   The emphasis is on diet quality:   Load up on vegetables and fruits - one-half of your plate: Aim for color and variety, and remember that potatoes dont count.   Go for whole grains - one-quarter of your plate: Whole wheat, barley, wheat  berries, quinoa, oats, brown rice, and foods made with them. If you want pasta, go with whole wheat pasta.   Protein power - one-quarter of your plate: Fish, chicken, beans, and nuts are all healthy, versatile protein sources. Limit red meat.   The diet, however, does go beyond the plate, offering a few other suggestions.   Use healthy plant oils, such as olive, canola, soy, corn, sunflower and peanut. Check the labels, and avoid partially hydrogenated oil, which have unhealthy trans fats.   If youre thirsty, drink water. Coffee and tea are good in moderation, but skip sugary drinks and limit milk and dairy products to one or two daily servings.   The type of carbohydrate in the diet is more important than the amount. Some sources of carbohydrates, such as vegetables, fruits, whole grains, and beans-are healthier than others.   Finally, stay active  Signed, Berniece Salines, DO  09/15/2020 9:20 AM    [place]

## 2020-09-14 NOTE — Patient Instructions (Addendum)
Medication Instructions:  1)  Stop Amlodipine   2) Start Diltiazem (Cardiem) 120 mg daily  3) Increase Lasix to 2 tablets (40 mg) by mouth every Monday, Wednesday, Friday and Sunday, Take 1 tablet (20 mg) by mouth every Tuesday, Thursday and Saturday   *If you need a refill on your cardiac medications before your next appointment, please call your pharmacy*   Lab Work: Your physician recommends that you return for lab work 1 week before your CT  If you have labs (blood work) drawn today and your tests are completely normal, you will receive your results only by: Marland Kitchen MyChart Message (if you have MyChart) OR . A paper copy in the mail If you have any lab test that is abnormal or we need to change your treatment, we will call you to review the results.   Testing/Procedures: Your physician has requested that you have an echocardiogram. Echocardiography is a painless test that uses sound waves to create images of your heart. It provides your doctor with information about the size and shape of your heart and how well your heart's chambers and valves are working. This procedure takes approximately one hour. There are no restrictions for this procedure.  Your physician has requested that you have an ankle brachial index (ABI). During this test an ultrasound and blood pressure cuff are used to evaluate the arteries that supply the arms and legs with blood. Allow thirty minutes for this exam. There are no restrictions or special instructions.  Your physician has recommended that you have a sleep study. This test records several body functions during sleep, including: brain activity, eye movement, oxygen and carbon dioxide blood levels, heart rate and rhythm, breathing rate and rhythm, the flow of air through your mouth and nose, snoring, body muscle movements, and chest and belly movement.   Follow-Up: At [facility], you and your health needs are our priority.  As part of our continuing mission  to provide you with exceptional heart care, we have created designated Provider Care Teams.  These Care Teams include your primary Cardiologist (physician) and Advanced Practice Providers (APPs -  Physician Assistants and Nurse Practitioners) who all work together to provide you with the care you need, when you need it.  We recommend signing up for the patient portal called "MyChart".  Sign up information is provided on this After Visit Summary.  MyChart is used to connect with patients for Virtual Visits (Telemedicine).  Patients are able to view lab/test results, encounter notes, upcoming appointments, etc.  Non-urgent messages can be sent to your provider as well.   To learn more about what you can do with MyChart, go to [email or web address].    Your next appointment:   12 week(s)  The format for your next appointment:   In Person  Provider:   Berniece Salines, DO   Other Instructions Your cardiac CT will be scheduled at one of the below locations:   [facility] [address] [place], [place] [address] [phone]  If scheduled at [facility], please arrive at the [facility] main entrance of [facility] 30 minutes prior to test start time. Proceed to the [facility] Radiology Department (first floor) to check-in and test prep.   Please follow these instructions carefully (unless otherwise directed):   On the Night Before the Test: . Be sure to Drink plenty of water. . Do not consume any caffeinated/decaffeinated beverages or chocolate 12 hours prior to your test. . Do not  take any antihistamines 12 hours prior to your test.  On the Day of the Test: . Drink plenty of water. Do not drink any water within one hour of the test. . Do not eat any food 4 hours prior to the test. . You may take your regular medications prior to the test.  . Take Diltiazem 2 hours before test  . HOLD Furosemide  morning of the test. . FEMALES- please wear  underwire-free bra if available       After the Test: . Drink plenty of water. . After receiving IV contrast, you may experience a mild flushed feeling. This is normal. . On occasion, you may experience a mild rash up to 24 hours after the test. This is not dangerous. If this occurs, you can take Benadryl 25 mg and increase your fluid intake. . If you experience trouble breathing, this can be serious. If it is severe call 911 IMMEDIATELY. If it is mild, please call our office.   Once we have confirmed authorization from your insurance company, we will call you to set up a date and time for your test. Based on how quickly your insurance processes prior authorizations requests, please allow up to 4 weeks to be contacted for scheduling your Cardiac CT appointment. Be advised that routine Cardiac CT appointments could be scheduled as many as 8 weeks after your provider has ordered it.  For non-scheduling related questions, please contact the cardiac imaging nurse navigator should you have any questions/concerns: Marchia Bond, Cardiac Imaging Nurse Navigator Burley Saver, Interim Cardiac Imaging Nurse [place] and Vascular Services Direct Office Dial: [phone]   For scheduling needs, including cancellations and rescheduling, please call [place], [phone].

## 2020-09-15 ENCOUNTER — Other Ambulatory Visit (HOSPITAL_COMMUNITY): Payer: Self-pay | Admitting: Nephrology

## 2020-09-15 ENCOUNTER — Telehealth: Payer: Self-pay | Admitting: Cardiology

## 2020-09-15 DIAGNOSIS — R079 Chest pain, unspecified: Secondary | ICD-10-CM

## 2020-09-15 DIAGNOSIS — R011 Cardiac murmur, unspecified: Secondary | ICD-10-CM | POA: Insufficient documentation

## 2020-09-15 DIAGNOSIS — R600 Localized edema: Secondary | ICD-10-CM | POA: Insufficient documentation

## 2020-09-15 DIAGNOSIS — M79669 Pain in unspecified lower leg: Secondary | ICD-10-CM

## 2020-09-15 DIAGNOSIS — R0602 Shortness of breath: Secondary | ICD-10-CM

## 2020-09-15 DIAGNOSIS — M79605 Pain in left leg: Secondary | ICD-10-CM

## 2020-09-15 DIAGNOSIS — Z79899 Other long term (current) drug therapy: Secondary | ICD-10-CM | POA: Insufficient documentation

## 2020-09-15 DIAGNOSIS — R400 Somnolence: Secondary | ICD-10-CM | POA: Insufficient documentation

## 2020-09-15 DIAGNOSIS — R0789 Other chest pain: Secondary | ICD-10-CM | POA: Insufficient documentation

## 2020-09-15 DIAGNOSIS — M79604 Pain in right leg: Secondary | ICD-10-CM

## 2020-09-15 DIAGNOSIS — R0683 Snoring: Secondary | ICD-10-CM

## 2020-09-15 DIAGNOSIS — R072 Precordial pain: Secondary | ICD-10-CM

## 2020-09-15 HISTORY — DX: Cardiac murmur, unspecified: R01.1

## 2020-09-15 HISTORY — DX: Chest pain, unspecified: R07.9

## 2020-09-15 HISTORY — DX: Pain in right leg: M79.604

## 2020-09-15 HISTORY — DX: Precordial pain: R07.2

## 2020-09-15 HISTORY — DX: Pain in left leg: M79.605

## 2020-09-15 HISTORY — DX: Localized edema: R60.0

## 2020-09-15 HISTORY — DX: Somnolence: R40.0

## 2020-09-15 HISTORY — DX: Other chest pain: R07.89

## 2020-09-15 HISTORY — DX: Shortness of breath: R06.02

## 2020-09-15 HISTORY — DX: Snoring: R06.83

## 2020-09-15 HISTORY — DX: Other long term (current) drug therapy: Z79.899

## 2020-09-15 NOTE — Telephone Encounter (Signed)
° °  Pt and her daughter would like to speak with a nurse about the upcoming tests for the pt.

## 2020-09-15 NOTE — Telephone Encounter (Signed)
Spoke to the patient and her daughter just now and let them know that Dr. Harriet Masson would need an order from the Urologist in order for this study to be ordered. She verbalizes understanding and thanks me for the call back.

## 2020-09-15 NOTE — Telephone Encounter (Signed)
If the Urologist faxed me the note and a RX for the study we could certainly have it done in our office. We need the prescription with the reason for the study.

## 2020-09-15 NOTE — Telephone Encounter (Signed)
Spoke to the patient and the patients daughter just now and she states that the patients urologist is recommending that the patient have a bilateral Venous Duplex completed. They are wondering if this can be done in our office and if so, can we put the order in for it?  I will route to Dr. Harriet Masson for further advise.

## 2020-09-21 ENCOUNTER — Telehealth: Payer: Self-pay | Admitting: Cardiology

## 2020-09-21 DIAGNOSIS — R0600 Dyspnea, unspecified: Secondary | ICD-10-CM | POA: Diagnosis not present

## 2020-09-21 DIAGNOSIS — E785 Hyperlipidemia, unspecified: Secondary | ICD-10-CM | POA: Diagnosis not present

## 2020-09-21 DIAGNOSIS — N1832 Chronic kidney disease, stage 3b: Secondary | ICD-10-CM | POA: Diagnosis not present

## 2020-09-21 DIAGNOSIS — M8080XA Other osteoporosis with current pathological fracture, unspecified site, initial encounter for fracture: Secondary | ICD-10-CM | POA: Diagnosis not present

## 2020-09-21 DIAGNOSIS — B9689 Other specified bacterial agents as the cause of diseases classified elsewhere: Secondary | ICD-10-CM | POA: Diagnosis not present

## 2020-09-21 DIAGNOSIS — J449 Chronic obstructive pulmonary disease, unspecified: Secondary | ICD-10-CM | POA: Diagnosis not present

## 2020-09-21 DIAGNOSIS — H9193 Unspecified hearing loss, bilateral: Secondary | ICD-10-CM | POA: Diagnosis not present

## 2020-09-21 DIAGNOSIS — G629 Polyneuropathy, unspecified: Secondary | ICD-10-CM | POA: Diagnosis not present

## 2020-09-21 DIAGNOSIS — F17200 Nicotine dependence, unspecified, uncomplicated: Secondary | ICD-10-CM | POA: Diagnosis not present

## 2020-09-21 DIAGNOSIS — E876 Hypokalemia: Secondary | ICD-10-CM | POA: Diagnosis not present

## 2020-09-21 DIAGNOSIS — R7989 Other specified abnormal findings of blood chemistry: Secondary | ICD-10-CM | POA: Diagnosis not present

## 2020-09-21 DIAGNOSIS — J208 Acute bronchitis due to other specified organisms: Secondary | ICD-10-CM | POA: Diagnosis not present

## 2020-09-21 NOTE — Telephone Encounter (Signed)
Patient wants to know if we have received the lab work that Dr. Truman Hayward faxed over. If not, does Dr. Harriet Masson need her to come in and do lab work?

## 2020-09-21 NOTE — Telephone Encounter (Signed)
I contacted Dr. Marguerita Beards office and spoke with Angie at the front desk. She took down the patients information for me and states that she will get them to send it over now.

## 2020-09-21 NOTE — Telephone Encounter (Signed)
We have not received the lab work.  Can you please reach out to the office to see if they can send that to [place].

## 2020-09-26 ENCOUNTER — Telehealth: Payer: Self-pay | Admitting: Cardiology

## 2020-09-26 ENCOUNTER — Ambulatory Visit (HOSPITAL_COMMUNITY): Admission: RE | Admit: 2020-09-26 | Payer: Medicare Other | Source: Ambulatory Visit

## 2020-09-26 ENCOUNTER — Telehealth (HOSPITAL_COMMUNITY): Payer: Self-pay | Admitting: Emergency Medicine

## 2020-09-26 DIAGNOSIS — R0602 Shortness of breath: Secondary | ICD-10-CM

## 2020-09-26 DIAGNOSIS — R0789 Other chest pain: Secondary | ICD-10-CM

## 2020-09-26 NOTE — Telephone Encounter (Signed)
Tina George, we can cancel the CCTA I agree with you the fact that she cannot lay flat due to the severe asthma she may not be a good candidate.    [name], please check with the patient to see if she would be willing to undergo a stress echo.  Her only thing will be she will need to walk on a treadmill for [place] to be able to get the images.  That way there are no medications involved.  Because she is also not a candidate for Lexi because her severe asthma that is not well controlled

## 2020-09-26 NOTE — Telephone Encounter (Signed)
Please let her know that I am trying to understand what is the best way to test her.  She is not a good candidate for pharmacologic nuclear stress test due to her uncontrolled asthma but if she is willing to take several days of steroids and her inhaler before testing we can consider a pharmacologic nuclear stress test.  The stress echo will be the only testing that we have no medicines.  And unfortunately she is unable to lie flat to get the coronary CTA.

## 2020-09-26 NOTE — Telephone Encounter (Signed)
Spoke to the patient just now and she let me know that she is willing to do the nuclear stress test and is willing to take the steroids beforehand. I will route this back to Dr. Harriet Masson to further review and make a decision on what steroids she should take beforehand.

## 2020-09-26 NOTE — Telephone Encounter (Signed)
Spoke to the patient just now and she let me know that she is not able to undergo a stress echo as she is not able to get up on a treadmill to complete this due to a hip issue that she currently has.   She is however concerned that she needs to be evaluated but does not have a way to go about doing this with these tests. I told her that I would route this message to Dr. Harriet Masson to see if she has any further advice from here.

## 2020-09-26 NOTE — Telephone Encounter (Signed)
Have her take 20 mg prednisone starting 3 days the test. Have her take her inhaler in the morning and also please have her bring her as needed albuterol with her to the test as well.

## 2020-09-26 NOTE — Telephone Encounter (Signed)
Reaching out to patient to offer assistance regarding upcoming cardiac imaging study; pt verbalizes understanding of appt date/time, parking situation and where to check in, pre-test NPO status and medications ordered, and verified current allergies; name and call back number provided for further questions should they arise Tina Bond RN [facility] [phone] office [phone] cell   Pt states she never picked up medication (diltiazem) because its too expensive. She also says she will need to take inhaler morning of scan and she cannot lie flat because of her severe asthma/COPD.  Tina George

## 2020-09-26 NOTE — Telephone Encounter (Signed)
Left message on patients voicemail to please return our call.   

## 2020-09-26 NOTE — Telephone Encounter (Signed)
Order for BMP has been placed. Awaiting further advice from Dr. Harriet Masson on how to proceed.

## 2020-09-26 NOTE — Telephone Encounter (Signed)
Follow up:   Patient returning a call back.  

## 2020-09-27 MED ORDER — PREDNISONE 20 MG PO TABS: 20.0000 mg | ORAL_TABLET | Freq: Every day | ORAL | 0 refills | Status: DC

## 2020-09-27 NOTE — Addendum Note (Signed)
Addended by: Berniece Salines on: 09/27/2020 09:04 AM   Modules accepted: Orders

## 2020-09-27 NOTE — Telephone Encounter (Signed)
Spoke to the patient just now and let her know Dr. Terrial Rhodes recommendations. She verbalizes understanding but has another question for Dr. Harriet Masson.   Patient states that she can not afford her Diltiazem 120 mg daily. She states that this is very expensive and with buying her necessary inhalers she can not afford it. She wants to know if there is an alternative that she can take.   I will also route to [place] so that she can get the lexiscan scheduled.

## 2020-09-27 NOTE — Addendum Note (Signed)
Addended by: Resa Miner I on: 09/27/2020 08:44 AM   Modules accepted: Orders

## 2020-09-28 ENCOUNTER — Ambulatory Visit (HOSPITAL_COMMUNITY): Payer: Medicare Other

## 2020-10-12 ENCOUNTER — Ambulatory Visit (INDEPENDENT_AMBULATORY_CARE_PROVIDER_SITE_OTHER): Payer: Medicare Other

## 2020-10-12 ENCOUNTER — Telehealth: Payer: Self-pay

## 2020-10-12 ENCOUNTER — Other Ambulatory Visit: Payer: Self-pay

## 2020-10-12 DIAGNOSIS — R600 Localized edema: Secondary | ICD-10-CM | POA: Diagnosis not present

## 2020-10-12 DIAGNOSIS — R011 Cardiac murmur, unspecified: Secondary | ICD-10-CM

## 2020-10-12 LAB — ECHOCARDIOGRAM COMPLETE
Area-P 1/2: 3.03 cm2
Calc EF: 49.8 %
S' Lateral: 2 cm
Single Plane A2C EF: 52.4 %
Single Plane A4C EF: 47.4 %

## 2020-10-12 NOTE — Progress Notes (Signed)
ABI exam performed.  Tina George RDCS, RVT  

## 2020-10-12 NOTE — Progress Notes (Signed)
Complete echocardiogram performed.  Jimmy Vonzella Althaus RDCS, RVT  

## 2020-10-12 NOTE — Telephone Encounter (Signed)
Spoke with patient regarding results and recommendation.  Patient verbalizes understanding and is agreeable to plan of care. Advised patient to call back with any issues or concerns.  

## 2020-10-12 NOTE — Telephone Encounter (Signed)
-----   Message from Thomasene Ripple, DO sent at 10/12/2020  4:54 PM EST ----- Vascular study normal

## 2020-10-23 ENCOUNTER — Encounter: Payer: Self-pay | Admitting: Cardiology

## 2020-10-27 ENCOUNTER — Ambulatory Visit (INDEPENDENT_AMBULATORY_CARE_PROVIDER_SITE_OTHER): Payer: Medicare Other | Admitting: Pulmonary Disease

## 2020-10-27 ENCOUNTER — Encounter: Payer: Self-pay | Admitting: Pulmonary Disease

## 2020-10-27 ENCOUNTER — Other Ambulatory Visit: Payer: Self-pay

## 2020-10-27 ENCOUNTER — Ambulatory Visit (INDEPENDENT_AMBULATORY_CARE_PROVIDER_SITE_OTHER): Payer: Medicare Other

## 2020-10-27 VITALS — BP 136/86 | HR 77 | Temp 97.2°F | Ht 66.0 in | Wt 145.2 lb

## 2020-10-27 DIAGNOSIS — J4541 Moderate persistent asthma with (acute) exacerbation: Secondary | ICD-10-CM

## 2020-10-27 DIAGNOSIS — R0683 Snoring: Secondary | ICD-10-CM | POA: Diagnosis not present

## 2020-10-27 DIAGNOSIS — Z720 Tobacco use: Secondary | ICD-10-CM

## 2020-10-27 DIAGNOSIS — J439 Emphysema, unspecified: Secondary | ICD-10-CM

## 2020-10-27 DIAGNOSIS — J449 Chronic obstructive pulmonary disease, unspecified: Secondary | ICD-10-CM | POA: Diagnosis not present

## 2020-10-27 DIAGNOSIS — J45909 Unspecified asthma, uncomplicated: Secondary | ICD-10-CM | POA: Diagnosis not present

## 2020-10-27 DIAGNOSIS — K219 Gastro-esophageal reflux disease without esophagitis: Secondary | ICD-10-CM | POA: Diagnosis not present

## 2020-10-27 LAB — BASIC METABOLIC PANEL
BUN: 25 mg/dL — ABNORMAL HIGH (ref 6–23)
CO2: 28 mEq/L (ref 19–32)
Calcium: 9.7 mg/dL (ref 8.4–10.5)
Chloride: 104 mEq/L (ref 96–112)
Creatinine, Ser: 1.12 mg/dL (ref 0.40–1.20)
GFR: 48.62 mL/min — ABNORMAL LOW (ref >60.00)
Glucose, Bld: 94 mg/dL (ref 70–99)
Potassium: 4.5 mEq/L (ref 3.5–5.1)
Sodium: 139 mEq/L (ref 135–145)

## 2020-10-27 LAB — CBC WITH DIFFERENTIAL/PLATELET
Basophils Absolute: 0.1 10*3/uL (ref 0.0–0.1)
Basophils Relative: 0.5 % (ref 0.0–3.0)
Eosinophils Absolute: 0 10*3/uL (ref 0.0–0.7)
Eosinophils Relative: 0.4 % (ref 0.0–5.0)
HCT: 42.2 % (ref 36.0–46.0)
Hemoglobin: 14.3 g/dL (ref 12.0–15.0)
Lymphocytes Relative: 14.3 % (ref 12.0–46.0)
Lymphs Abs: 1.5 10*3/uL (ref 0.7–4.0)
MCHC: 34 g/dL (ref 30.0–36.0)
MCV: 92.2 fl (ref 78.0–100.0)
Monocytes Absolute: 0.8 10*3/uL (ref 0.1–1.0)
Monocytes Relative: 7.4 % (ref 3.0–12.0)
Neutro Abs: 8.4 10*3/uL — ABNORMAL HIGH (ref 1.4–7.7)
Neutrophils Relative %: 77.4 % — ABNORMAL HIGH (ref 43.0–77.0)
Platelets: 274 10*3/uL (ref 150.0–400.0)
RBC: 4.58 Mil/uL (ref 3.87–5.11)
RDW: 15 % (ref 11.5–15.5)
WBC: 10.9 10*3/uL — ABNORMAL HIGH (ref 4.0–10.5)

## 2020-10-27 MED ORDER — BREZTRI AEROSPHERE 160-9-4.8 MCG/ACT IN AERO: 2.0000 | INHALATION_SPRAY | Freq: Two times a day (BID) | RESPIRATORY_TRACT | 6 refills | Status: DC

## 2020-10-27 MED ORDER — BREZTRI AEROSPHERE 160-9-4.8 MCG/ACT IN AERO: 2.0000 | INHALATION_SPRAY | Freq: Two times a day (BID) | RESPIRATORY_TRACT | 0 refills | Status: DC

## 2020-10-27 MED ORDER — PREDNISONE 10 MG PO TABS: 10.0000 mg | ORAL_TABLET | Freq: Every day | ORAL | 0 refills | Status: DC

## 2020-10-27 NOTE — Patient Instructions (Addendum)
Start breztri inhaler 2 puffs twice daily with spacer - rinse mouth out after  Use albuterol inhaler or nebulizer as needed every 4-6 hours   Start prednisone taper: 4 tabs x 3 days 3 tabs x 3 days 2 tabs x 3 days 1 tab x 3 days  We will schedule you for pulmonary function testing in 1 month at follow up visit  We will schedule you for split night sleep study for concern of sleep apnea  Work on quitting smoking. Start nicotine patches either 7mg  to 14mg  patches along with nicotine gum or lozenges.

## 2020-10-27 NOTE — Progress Notes (Signed)
Synopsis: Referred in 10/2020 for COPD  Subjective:   PATIENT ID: Tina George GENDER: female DOB: 04-Oct-1946, MRN: [id number]   HPI  Chief Complaint  Patient presents with  . Consult    Referred by PCP Dr. Truman Hayward at [facility] for COPD. Per patient, she has noticed that her breathing has gotten worse over the last 6 months.    Tina George is a 75 year old woman, daily smoker with asthma who is referred to pulmonary clinic for progressive dyspnea.   She has been on Symbicort 160-4.55mcg 2 puffs twice daily along with as needed albuterol for her asthma for many years with good control until the last few months. She reports increased shortness of breath, wheezing and cough. She has received prolonged courses of antibiotics and steroids without significant improvement.  She continues to smoke half a pack per day. She lives with her daughter who also smokes. There is significant family history of asthma of multiple generations. Her daughter reports significant snoring events at night and apneic events as well. She also complains of heart burn symptoms. She denies sinus congestion or drainage.      Past Medical History:  Diagnosis Date  . COPD (chronic obstructive pulmonary disease) ([place])   . Depression   . Dyslipidemia   . GERD (gastroesophageal reflux disease)   . High blood pressure   . Hypokalemia   . Muscle cramps   . Psoriasis      Family History  Problem Relation Age of Onset  . Pancreatic cancer Mother   . Hypertension Father   . Heart disease Father   . Pancreatic cancer Sister   . Pancreatic cancer Brother      Social History   Socioeconomic History  . Marital status: Widowed    Spouse name: Not on file  . Number of children: 2  . Years of education: Not on file  . Highest education level: Not on file  Occupational History  . Not on file  Tobacco Use  . Smoking status: Current Every Day Smoker    Packs/day: 0.50  . Smokeless tobacco: Never  Used  Substance and Sexual Activity  . Alcohol use: Never  . Drug use: Never  . Sexual activity: Not on file  Other Topics Concern  . Not on file  Social History Narrative  . Not on file   Social Determinants of Health   Financial Resource Strain: Not on file  Food Insecurity: Not on file  Transportation Needs: Not on file  Physical Activity: Not on file  Stress: Not on file  Social Connections: Not on file  Intimate Partner Violence: Not on file     Allergies  Allergen Reactions  . Aspirin   . Codeine   . Levaquin [Levofloxacin In D5w]      Outpatient Medications Prior to Visit  Medication Sig Dispense Refill  . albuterol (PROVENTIL HFA;VENTOLIN HFA) 108 (90 Base) MCG/ACT inhaler     . ALPRAZolam (XANAX) 0.25 MG tablet Take 0.25 mg by mouth every 6 (six) hours as needed.    Marland Kitchen atorvastatin (LIPITOR) 40 MG tablet Take 40 mg by mouth daily.    Marland Kitchen diltiazem (CARDIZEM CD) 120 MG 24 hr capsule Take 1 capsule (120 mg total) by mouth daily. 90 capsule 3  . famotidine (PEPCID) 20 MG tablet Take 20 mg by mouth 2 (two) times daily.    . furosemide (LASIX) 20 MG tablet Take 1 tablet (20 mg total) by mouth as directed. Take  2 tablets(40 mg) by mouth every Monday, Wednesday, Friday and Sunday. Take 1 tablet (20 mg) by mouth every Tuesday, Thursday and Saturday. 180 tablet 3  . gabapentin (NEURONTIN) 300 MG capsule Take 300 mg by mouth at bedtime.    . gabapentin (NEURONTIN) 600 MG tablet Take 600 mg by mouth at bedtime.    Marland Kitchen ibuprofen (ADVIL,MOTRIN) 200 MG tablet Take 200 mg by mouth.    Marland Kitchen ipratropium-albuterol (DUONEB) 0.5-2.5 (3) MG/3ML SOLN Take 3 mLs by nebulization every 4 (four) hours as needed.    . pantoprazole (PROTONIX) 40 MG tablet Take 1 tablet (40 mg total) by mouth 2 (two) times daily. 60 tablet 2  . PARoxetine (PAXIL) 30 MG tablet Take 30 mg by mouth daily.    . Potassium 99 MG TABS Take 2 tablets by mouth daily.    Marland Kitchen rOPINIRole (REQUIP) 1 MG tablet Take 1.5 mg by mouth at  bedtime.    . rosuvastatin (CRESTOR) 10 MG tablet Take 10 mg by mouth daily.    Marland Kitchen spironolactone (ALDACTONE) 50 MG tablet Take 50 mg by mouth daily.    . budesonide-formoterol (SYMBICORT) 160-4.5 MCG/ACT inhaler Inhale 2 puffs into the lungs 2 (two) times daily.    . nitrofurantoin, macrocrystal-monohydrate, (MACROBID) 100 MG capsule Take 100 mg by mouth 2 (two) times daily.    . predniSONE (DELTASONE) 20 MG tablet Take 1 tablet (20 mg total) by mouth daily with breakfast. 3 tablet 0  . doxycycline (VIBRA-TABS) 100 MG tablet Take 100 mg by mouth 2 (two) times daily.    . fluconazole (DIFLUCAN) 150 MG tablet Take 150 mg by mouth daily.    . predniSONE (DELTASONE) 10 MG tablet Take by mouth.    . sulfamethoxazole-trimethoprim (BACTRIM DS) 800-160 MG tablet Take 1 tablet by mouth 2 (two) times daily.     No facility-administered medications prior to visit.    Review of Systems  Constitutional: Positive for malaise/fatigue. Negative for chills, fever and weight loss.  HENT: Negative for congestion, sinus pain and sore throat.   Eyes: Negative.   Respiratory: Positive for cough, sputum production, shortness of breath and wheezing. Negative for hemoptysis.   Cardiovascular: Positive for leg swelling. Negative for chest pain, palpitations, orthopnea, claudication and PND.  Gastrointestinal: Positive for heartburn. Negative for abdominal pain, nausea and vomiting.  Genitourinary: Negative.   Musculoskeletal: Negative.   Skin: Negative for rash.  Neurological: Negative.   Endo/Heme/Allergies: Negative.   Psychiatric/Behavioral: Negative.    Objective:   Vitals:   10/27/20 1407  BP: 136/86  Pulse: 77  Temp: (!) 97.2 F (36.2 C)  TempSrc: Temporal  SpO2: 97%  Weight: 145 lb 3.2 oz (65.9 kg)  Height: 5\' 6"  (1.676 m)     Physical Exam Constitutional:      General: She is not in acute distress. HENT:     Head: Normocephalic and atraumatic.  Eyes:     General: No scleral icterus.     Conjunctiva/sclera: Conjunctivae normal.     Pupils: Pupils are equal, round, and reactive to light.  Cardiovascular:     Rate and Rhythm: Normal rate and regular rhythm.     Pulses: Normal pulses.     Heart sounds: Normal heart sounds. No murmur heard.   Pulmonary:     Breath sounds: Wheezing and rhonchi present.  Abdominal:     General: Bowel sounds are normal.     Palpations: Abdomen is soft.  Musculoskeletal:     Right lower leg: No edema.  Left lower leg: No edema.  Skin:    General: Skin is warm and dry.  Neurological:     General: No focal deficit present.     Mental Status: She is alert.  Psychiatric:        Mood and Affect: Mood normal.        Behavior: Behavior normal.        Thought Content: Thought content normal.        Judgment: Judgment normal.     CBC    Component Value Date/Time   WBC 10.9 (H) 10/27/2020 1513   RBC 4.58 10/27/2020 1513   HGB 14.3 10/27/2020 1513   HCT 42.2 10/27/2020 1513   PLT 274.0 10/27/2020 1513   MCV 92.2 10/27/2020 1513   MCHC 34.0 10/27/2020 1513   RDW 15.0 10/27/2020 1513   LYMPHSABS 1.5 10/27/2020 1513   MONOABS 0.8 10/27/2020 1513   EOSABS 0.0 10/27/2020 1513   BASOSABS 0.1 10/27/2020 1513   BMP Latest Ref Rng & Units 10/27/2020 05/05/2019  Glucose 70 - 99 mg/dL 94 92  BUN 6 - 23 mg/dL 25(H) 13  Creatinine 0.40 - 1.20 mg/dL 1.12 0.97  BUN/Creat Ratio 12 - 28 - 13  Sodium 135 - 145 mEq/L 139 144  Potassium 3.5 - 5.1 mEq/L 4.5 5.0  Chloride 96 - 112 mEq/L 104 104  CO2 19 - 32 mEq/L 28 24  Calcium 8.4 - 10.5 mg/dL 9.7 9.5    Chest imaging: CXR 10/27/2020 Capsular calcification at BILATERAL breast prostheses. Normal heart size, mediastinal contours, and pulmonary vascularity. Atherosclerotic calcification aorta. Emphysematous and minimal bronchitic changes consistent with COPD. No acute infiltrate, pleural effusion, or pneumothorax. Scattered endplate spur formation thoracic spine.  PFT: No flowsheet data  found.  Echo: 10/12/20 1. Left ventricular ejection fraction, by estimation, is 60 to 65%. The  left ventricle has normal function. The left ventricle has no regional  wall motion abnormalities. Left ventricular diastolic parameters are  consistent with Grade I diastolic  dysfunction (impaired relaxation).  2. Right ventricular systolic function is normal. The right ventricular  size is normal. There is normal pulmonary artery systolic pressure.  3. The mitral valve is normal in structure. Mild mitral valve  regurgitation. No evidence of mitral stenosis.  4. The aortic valve is normal in structure. Aortic valve regurgitation is  not visualized. No aortic stenosis is present.  5. The inferior vena cava is normal in size with greater than 50%  respiratory variability, suggesting right atrial pressure of 3 mmHg.     Assessment & Plan:   Moderate persistent asthma with acute exacerbation - Plan: CBC with Differential, IgE, DG Chest 2 View, Pulmonary Function Test, Basic Metabolic Panel (BMET), Basic Metabolic Panel (BMET), IgE, CBC with Differential, CANCELED: Basic Metabolic Panel (BMET)  Pulmonary emphysema, unspecified emphysema type ([place])  Snoring - Plan: Split night study  Tobacco use  Discussion: Laerica Weismann is a 75 year old woman, daily smoker with asthma who is referred to pulmonary clinic for progressive dyspnea.   Her dyspnea is secondary to progressive obstructive lung disease with concerns of emphysema based on chest radiograph today and her history of asthma. Her progressive dyspnea and worsening respiratory symptoms are directly related to her continued smoking. She does not have elevated eosinophil count on CBC with differential today. IgE level is pending.   We will treat her for asthma/copd exacerbation with extended prednisone taper (40mg  for 3 days, 30mg  for 3 days, 20mg  for 3 days and 10mg  for  3 days). We will step up her inhaler therapy from symbicort to [facility]  2 puffs twice daily. She can continue as needed albuterol inhaler or nebulizer treatments.   She has been encouraged to quit smoking. Nicotine replacement therapy with patches/lozenges/gum has been recommended.   We will also schedule her for a split night sleep study for concerns of obstructive sleep apnea. In lab study chosen due to her degree of underlying pulmonary disease.  Follow up in 1 month for pulmonary function testing   Freda Jackson, MD [facility] Office: [phone]   See Amion for Pager Details       Current Outpatient Medications:  .  albuterol (PROVENTIL HFA;VENTOLIN HFA) 108 (90 Base) MCG/ACT inhaler, , Disp: , Rfl:  .  ALPRAZolam (XANAX) 0.25 MG tablet, Take 0.25 mg by mouth every 6 (six) hours as needed., Disp: , Rfl:  .  atorvastatin (LIPITOR) 40 MG tablet, Take 40 mg by mouth daily., Disp: , Rfl:  .  Budeson-Glycopyrrol-Formoterol (BREZTRI AEROSPHERE) 160-9-4.8 MCG/ACT AERO, Inhale 2 puffs into the lungs in the morning and at bedtime., Disp: 10.7 g, Rfl: 6 .  Budeson-Glycopyrrol-Formoterol (BREZTRI AEROSPHERE) 160-9-4.8 MCG/ACT AERO, Inhale 2 puffs into the lungs in the morning and at bedtime., Disp: 23.6 g, Rfl: 0 .  diltiazem (CARDIZEM CD) 120 MG 24 hr capsule, Take 1 capsule (120 mg total) by mouth daily., Disp: 90 capsule, Rfl: 3 .  famotidine (PEPCID) 20 MG tablet, Take 20 mg by mouth 2 (two) times daily., Disp: , Rfl:  .  furosemide (LASIX) 20 MG tablet, Take 1 tablet (20 mg total) by mouth as directed. Take 2 tablets(40 mg) by mouth every Monday, Wednesday, Friday and Sunday. Take 1 tablet (20 mg) by mouth every Tuesday, Thursday and Saturday., Disp: 180 tablet, Rfl: 3 .  gabapentin (NEURONTIN) 300 MG capsule, Take 300 mg by mouth at bedtime., Disp: , Rfl:  .  gabapentin (NEURONTIN) 600 MG tablet, Take 600 mg by mouth at bedtime., Disp: , Rfl:  .  ibuprofen (ADVIL,MOTRIN) 200 MG tablet, Take 200 mg by mouth., Disp: , Rfl:  .   ipratropium-albuterol (DUONEB) 0.5-2.5 (3) MG/3ML SOLN, Take 3 mLs by nebulization every 4 (four) hours as needed., Disp: , Rfl:  .  pantoprazole (PROTONIX) 40 MG tablet, Take 1 tablet (40 mg total) by mouth 2 (two) times daily., Disp: 60 tablet, Rfl: 2 .  PARoxetine (PAXIL) 30 MG tablet, Take 30 mg by mouth daily., Disp: , Rfl:  .  Potassium 99 MG TABS, Take 2 tablets by mouth daily., Disp: , Rfl:  .  predniSONE (DELTASONE) 10 MG tablet, Take 1 tablet (10 mg total) by mouth daily with breakfast., Disp: 30 tablet, Rfl: 0 .  rOPINIRole (REQUIP) 1 MG tablet, Take 1.5 mg by mouth at bedtime., Disp: , Rfl:  .  rosuvastatin (CRESTOR) 10 MG tablet, Take 10 mg by mouth daily., Disp: , Rfl:  .  spironolactone (ALDACTONE) 50 MG tablet, Take 50 mg by mouth daily., Disp: , Rfl:

## 2020-10-31 LAB — IGE: IgE (Immunoglobulin E), Serum: 30 kU/L (ref <=114)

## 2020-11-08 ENCOUNTER — Telehealth: Payer: Self-pay | Admitting: *Deleted

## 2020-11-08 NOTE — Telephone Encounter (Signed)
Patient given detailed instructions per Myocardial Perfusion Study Information Sheet for the test on 11/15/20 at 1115. Patient notified to arrive 15 minutes early and that it is imperative to arrive on time for appointment to keep from having the test rescheduled.  If you need to cancel or reschedule your appointment, please call the office within 24 hours of your appointment. . Patient verbalized understanding.Kayelyn Lemon, Ranae Palms No mychart available.

## 2020-11-13 ENCOUNTER — Telehealth: Payer: Self-pay | Admitting: Cardiology

## 2020-11-13 NOTE — Telephone Encounter (Signed)
Patient thought she was told she needed to take a medication 3 days prior to her stress test. She was unsure of the name, she thought maybe prednisone. Please advise.

## 2020-11-14 NOTE — Telephone Encounter (Signed)
Spoke with patient about how she has been taking the Prednisone. Patient states she has been taking 10 mg with breakfast daily for the past 4-5 days. Per Dr. Harriet Masson patient is to continue taking the 10 mg as prescribed by her pulmonologist and still attend her test tomorrow. Patient verbalizes understanding and thanks me for getting things situated. No further questions or concerns at this time.

## 2020-11-15 ENCOUNTER — Other Ambulatory Visit: Payer: Self-pay

## 2020-11-15 ENCOUNTER — Ambulatory Visit (INDEPENDENT_AMBULATORY_CARE_PROVIDER_SITE_OTHER): Payer: Medicare Other

## 2020-11-15 DIAGNOSIS — R0602 Shortness of breath: Secondary | ICD-10-CM | POA: Diagnosis not present

## 2020-11-15 DIAGNOSIS — R0789 Other chest pain: Secondary | ICD-10-CM

## 2020-11-15 LAB — MYOCARDIAL PERFUSION IMAGING
LV dias vol: 34 mL (ref 46–106)
LV sys vol: 10 mL
Peak HR: 92 {beats}/min
Rest HR: 78 {beats}/min
SDS: 1
SRS: 5
SSS: 6
TID: 0.94

## 2020-11-15 MED ORDER — AMINOPHYLLINE 25 MG/ML IV SOLN
75.0000 mg | Freq: Once | INTRAVENOUS | Status: AC
Start: 2020-11-15 — End: 2020-11-15
  Administered 2020-11-15: 75 mg via INTRAVENOUS

## 2020-11-15 MED ORDER — REGADENOSON 0.4 MG/5ML IV SOLN
0.4000 mg | Freq: Once | INTRAVENOUS | Status: AC
  Administered 2020-11-15: 0.4 mg via INTRAVENOUS

## 2020-11-15 MED ORDER — TECHNETIUM TC 99M TETROFOSMIN IV KIT
31.4000 | PACK | Freq: Once | INTRAVENOUS | Status: AC | PRN
  Administered 2020-11-15: 31.4 via INTRAVENOUS

## 2020-11-15 MED ORDER — TECHNETIUM TC 99M TETROFOSMIN IV KIT
10.7000 | PACK | Freq: Once | INTRAVENOUS | Status: AC | PRN
  Administered 2020-11-15: 10.7 via INTRAVENOUS

## 2020-11-17 DIAGNOSIS — H9193 Unspecified hearing loss, bilateral: Secondary | ICD-10-CM | POA: Diagnosis not present

## 2020-11-17 DIAGNOSIS — R234 Changes in skin texture: Secondary | ICD-10-CM | POA: Diagnosis not present

## 2020-11-17 DIAGNOSIS — R7989 Other specified abnormal findings of blood chemistry: Secondary | ICD-10-CM | POA: Diagnosis not present

## 2020-11-17 DIAGNOSIS — J449 Chronic obstructive pulmonary disease, unspecified: Secondary | ICD-10-CM | POA: Diagnosis not present

## 2020-11-17 DIAGNOSIS — E785 Hyperlipidemia, unspecified: Secondary | ICD-10-CM | POA: Diagnosis not present

## 2020-11-17 DIAGNOSIS — M8080XA Other osteoporosis with current pathological fracture, unspecified site, initial encounter for fracture: Secondary | ICD-10-CM | POA: Diagnosis not present

## 2020-11-17 DIAGNOSIS — F17200 Nicotine dependence, unspecified, uncomplicated: Secondary | ICD-10-CM | POA: Diagnosis not present

## 2020-11-17 DIAGNOSIS — M79671 Pain in right foot: Secondary | ICD-10-CM | POA: Diagnosis not present

## 2020-11-17 DIAGNOSIS — E876 Hypokalemia: Secondary | ICD-10-CM | POA: Diagnosis not present

## 2020-11-17 DIAGNOSIS — G629 Polyneuropathy, unspecified: Secondary | ICD-10-CM | POA: Diagnosis not present

## 2020-11-17 DIAGNOSIS — R0600 Dyspnea, unspecified: Secondary | ICD-10-CM | POA: Diagnosis not present

## 2020-11-17 DIAGNOSIS — M159 Polyosteoarthritis, unspecified: Secondary | ICD-10-CM | POA: Diagnosis not present

## 2020-11-17 DIAGNOSIS — B370 Candidal stomatitis: Secondary | ICD-10-CM | POA: Diagnosis not present

## 2020-11-17 DIAGNOSIS — L0390 Cellulitis, unspecified: Secondary | ICD-10-CM | POA: Diagnosis not present

## 2020-12-04 ENCOUNTER — Ambulatory Visit: Payer: Medicare Other | Admitting: Pulmonary Disease

## 2020-12-04 DIAGNOSIS — R5382 Chronic fatigue, unspecified: Secondary | ICD-10-CM | POA: Diagnosis not present

## 2020-12-04 DIAGNOSIS — E876 Hypokalemia: Secondary | ICD-10-CM | POA: Diagnosis not present

## 2020-12-04 DIAGNOSIS — E785 Hyperlipidemia, unspecified: Secondary | ICD-10-CM | POA: Diagnosis not present

## 2020-12-04 DIAGNOSIS — F17200 Nicotine dependence, unspecified, uncomplicated: Secondary | ICD-10-CM | POA: Diagnosis not present

## 2020-12-04 DIAGNOSIS — R1310 Dysphagia, unspecified: Secondary | ICD-10-CM | POA: Diagnosis not present

## 2020-12-04 DIAGNOSIS — N1832 Chronic kidney disease, stage 3b: Secondary | ICD-10-CM | POA: Diagnosis not present

## 2020-12-04 DIAGNOSIS — H9193 Unspecified hearing loss, bilateral: Secondary | ICD-10-CM | POA: Diagnosis not present

## 2020-12-04 DIAGNOSIS — J449 Chronic obstructive pulmonary disease, unspecified: Secondary | ICD-10-CM | POA: Diagnosis not present

## 2020-12-04 DIAGNOSIS — M8080XA Other osteoporosis with current pathological fracture, unspecified site, initial encounter for fracture: Secondary | ICD-10-CM | POA: Diagnosis not present

## 2020-12-04 DIAGNOSIS — R0600 Dyspnea, unspecified: Secondary | ICD-10-CM | POA: Diagnosis not present

## 2020-12-04 DIAGNOSIS — M159 Polyosteoarthritis, unspecified: Secondary | ICD-10-CM | POA: Diagnosis not present

## 2020-12-04 DIAGNOSIS — R7989 Other specified abnormal findings of blood chemistry: Secondary | ICD-10-CM | POA: Diagnosis not present

## 2020-12-04 DIAGNOSIS — I10 Essential (primary) hypertension: Secondary | ICD-10-CM | POA: Diagnosis not present

## 2020-12-06 ENCOUNTER — Other Ambulatory Visit: Payer: Self-pay

## 2020-12-07 ENCOUNTER — Ambulatory Visit: Payer: Medicare Other | Admitting: Cardiology

## 2020-12-08 ENCOUNTER — Other Ambulatory Visit: Payer: Self-pay

## 2020-12-09 DIAGNOSIS — F419 Anxiety disorder, unspecified: Secondary | ICD-10-CM | POA: Diagnosis not present

## 2020-12-09 DIAGNOSIS — R7989 Other specified abnormal findings of blood chemistry: Secondary | ICD-10-CM | POA: Diagnosis not present

## 2020-12-09 DIAGNOSIS — M8080XA Other osteoporosis with current pathological fracture, unspecified site, initial encounter for fracture: Secondary | ICD-10-CM | POA: Diagnosis not present

## 2020-12-09 DIAGNOSIS — E876 Hypokalemia: Secondary | ICD-10-CM | POA: Diagnosis not present

## 2020-12-09 DIAGNOSIS — I10 Essential (primary) hypertension: Secondary | ICD-10-CM | POA: Diagnosis not present

## 2020-12-09 DIAGNOSIS — H9193 Unspecified hearing loss, bilateral: Secondary | ICD-10-CM | POA: Diagnosis not present

## 2020-12-09 DIAGNOSIS — N1832 Chronic kidney disease, stage 3b: Secondary | ICD-10-CM | POA: Diagnosis not present

## 2020-12-09 DIAGNOSIS — R0600 Dyspnea, unspecified: Secondary | ICD-10-CM | POA: Diagnosis not present

## 2020-12-09 DIAGNOSIS — E785 Hyperlipidemia, unspecified: Secondary | ICD-10-CM | POA: Diagnosis not present

## 2020-12-09 DIAGNOSIS — M159 Polyosteoarthritis, unspecified: Secondary | ICD-10-CM | POA: Diagnosis not present

## 2020-12-09 DIAGNOSIS — F17200 Nicotine dependence, unspecified, uncomplicated: Secondary | ICD-10-CM | POA: Diagnosis not present

## 2020-12-09 DIAGNOSIS — J449 Chronic obstructive pulmonary disease, unspecified: Secondary | ICD-10-CM | POA: Diagnosis not present

## 2020-12-12 ENCOUNTER — Encounter: Payer: Self-pay | Admitting: Cardiology

## 2020-12-12 ENCOUNTER — Other Ambulatory Visit: Payer: Self-pay

## 2020-12-12 ENCOUNTER — Ambulatory Visit (INDEPENDENT_AMBULATORY_CARE_PROVIDER_SITE_OTHER): Payer: Medicare Other | Admitting: Cardiology

## 2020-12-12 VITALS — BP 122/62 | HR 72 | Ht 66.0 in | Wt 150.6 lb

## 2020-12-12 DIAGNOSIS — R072 Precordial pain: Secondary | ICD-10-CM

## 2020-12-12 DIAGNOSIS — R5383 Other fatigue: Secondary | ICD-10-CM

## 2020-12-12 DIAGNOSIS — J42 Unspecified chronic bronchitis: Secondary | ICD-10-CM

## 2020-12-12 DIAGNOSIS — E8881 Metabolic syndrome: Secondary | ICD-10-CM

## 2020-12-12 DIAGNOSIS — R0602 Shortness of breath: Secondary | ICD-10-CM

## 2020-12-12 DIAGNOSIS — E785 Hyperlipidemia, unspecified: Secondary | ICD-10-CM

## 2020-12-12 NOTE — Patient Instructions (Addendum)
Medication Instructions:  Your physician has recommended you make the following change in your medication: STOP: Lipitor  *If you need a refill on your cardiac medications before your next appointment, please call your pharmacy*   Lab Work: None If you have labs (blood work) drawn today and your tests are completely normal, you will receive your results only by: Marland Kitchen MyChart Message (if you have MyChart) OR . A paper copy in the mail If you have any lab test that is abnormal or we need to change your treatment, we will call you to review the results.   Testing/Procedures: None   Follow-Up: At [facility], you and your health needs are our priority.  As part of our continuing mission to provide you with exceptional heart care, we have created designated Provider Care Teams.  These Care Teams include your primary Cardiologist (physician) and Advanced Practice Providers (APPs -  Physician Assistants and Nurse Practitioners) who all work together to provide you with the care you need, when you need it.  We recommend signing up for the patient portal called "MyChart".  Sign up information is provided on this After Visit Summary.  MyChart is used to connect with patients for Virtual Visits (Telemedicine).  Patients are able to view lab/test results, encounter notes, upcoming appointments, etc.  Non-urgent messages can be sent to your provider as well.   To learn more about what you can do with MyChart, go to [email or web address].    Your next appointment:   6 month(s)  The format for your next appointment:   In Person  Provider:   Berniece Salines, DO   Other Instructions

## 2020-12-12 NOTE — Progress Notes (Signed)
Cardiology Office Note:    Date:  12/12/2020   ID:  Tina George, DOB September 03, 1946, MRN [id number]  PCP:  [name], MD  Cardiologist:  Berniece Salines, DO  Electrophysiologist:  None   Referring MD: [name], MD   I still am having shortness of breath.  History of Present Illness:    Tina George is a 75 y.o. female with a hx of dyslipidemia, hypertension, depression, smoker family history of premature coronary artery disease.  Did see the patient initially on September 14, 2020 at that time she was referred to cardiology as she could not lay flat for her endoscopy.  During her visit she reported that she had had some intermittent chest discomfort with that plan an ischemic evaluation.  She was unable to get her coronary CT scan as her heart rate could not be well controlled we therefore pursued a nuclear stress test.  In the interim she did get her nuclear stress test which was normal.  Past Medical History:  Diagnosis Date  . Bilateral leg edema 09/15/2020  . COPD (chronic obstructive pulmonary disease) ([place])   . Daytime somnolence 09/15/2020  . Depression   . Dyslipidemia   . GERD (gastroesophageal reflux disease)   . High blood pressure   . Hypokalemia   . Medication management 09/15/2020  . Murmur 09/15/2020  . Muscle cramps   . Other chest pain 09/15/2020  . Pain in both lower extremities 09/15/2020  . Precordial pain 09/15/2020  . Psoriasis   . Snoring 09/15/2020  . SOB (shortness of breath) 09/15/2020    Past Surgical History:  Procedure Laterality Date  . ABDOMINAL HYSTERECTOMY    . APPENDECTOMY    . arthroscopic shoulder surgery    . BACK SURGERY    . CATARACT EXTRACTION, BILATERAL    . CHOLECYSTECTOMY    . MASTECTOMY    . MASTOIDECTOMY    . PARTIAL COLECTOMY      Current Medications: Current Meds  Medication Sig  . albuterol (PROVENTIL HFA;VENTOLIN HFA) 108 (90 Base) MCG/ACT inhaler   . ALPRAZolam (XANAX) 0.25 MG tablet Take 0.25 mg by mouth every 6 (six)  hours as needed.  Marland Kitchen amLODipine (NORVASC) 5 MG tablet Take 5 mg by mouth daily.  . Budeson-Glycopyrrol-Formoterol (BREZTRI AEROSPHERE) 160-9-4.8 MCG/ACT AERO Inhale 2 puffs into the lungs in the morning and at bedtime.  Marland Kitchen diltiazem (CARDIZEM CD) 120 MG 24 hr capsule Take 1 capsule (120 mg total) by mouth daily.  Marland Kitchen doxycycline (VIBRA-TABS) 100 MG tablet Take 100 mg by mouth 2 (two) times daily.  . famotidine (PEPCID) 20 MG tablet Take 20 mg by mouth 2 (two) times daily.  . fluconazole (DIFLUCAN) 150 MG tablet Take 150 mg by mouth daily.  . furosemide (LASIX) 20 MG tablet Take 1 tablet (20 mg total) by mouth as directed. Take 2 tablets(40 mg) by mouth every Monday, Wednesday, Friday and Sunday. Take 1 tablet (20 mg) by mouth every Tuesday, Thursday and Saturday.  . gabapentin (NEURONTIN) 300 MG capsule Take 300 mg by mouth at bedtime.  Marland Kitchen ibuprofen (ADVIL,MOTRIN) 200 MG tablet Take 200 mg by mouth.  Marland Kitchen ipratropium-albuterol (DUONEB) 0.5-2.5 (3) MG/3ML SOLN Take 3 mLs by nebulization every 4 (four) hours as needed.  . pantoprazole (PROTONIX) 40 MG tablet Take 1 tablet (40 mg total) by mouth 2 (two) times daily.  Marland Kitchen PARoxetine (PAXIL) 40 MG tablet Take 40 mg by mouth every morning.  . Potassium 99 MG TABS Take 2 tablets by mouth  daily.  . predniSONE (DELTASONE) 10 MG tablet Take 1 tablet (10 mg total) by mouth daily with breakfast.  . rOPINIRole (REQUIP) 3 MG tablet Take 3 mg by mouth at bedtime.  . rosuvastatin (CRESTOR) 10 MG tablet Take 10 mg by mouth daily.  Marland Kitchen spironolactone (ALDACTONE) 50 MG tablet Take 50 mg by mouth daily.  . traMADol (ULTRAM) 50 MG tablet Take 50 mg by mouth 3 (three) times daily as needed for pain.  . [DISCONTINUED] atorvastatin (LIPITOR) 40 MG tablet Take 40 mg by mouth daily.  . [DISCONTINUED] Budeson-Glycopyrrol-Formoterol (BREZTRI AEROSPHERE) 160-9-4.8 MCG/ACT AERO Inhale 2 puffs into the lungs in the morning and at bedtime.  . [DISCONTINUED] gabapentin (NEURONTIN) 600 MG  tablet Take 600 mg by mouth at bedtime.     Allergies:   Aspirin, Codeine, and Levaquin [levofloxacin in d5w]   Social History   Socioeconomic History  . Marital status: Widowed    Spouse name: Not on file  . Number of children: 2  . Years of education: Not on file  . Highest education level: Not on file  Occupational History  . Not on file  Tobacco Use  . Smoking status: Current Every Day Smoker    Packs/day: 0.50  . Smokeless tobacco: Never Used  Substance and Sexual Activity  . Alcohol use: Never  . Drug use: Never  . Sexual activity: Not on file  Other Topics Concern  . Not on file  Social History Narrative  . Not on file   Social Determinants of Health   Financial Resource Strain: Not on file  Food Insecurity: Not on file  Transportation Needs: Not on file  Physical Activity: Not on file  Stress: Not on file  Social Connections: Not on file     Family History: The patient's family history includes Heart disease in her father; Hypertension in her father; Pancreatic cancer in her brother, mother, and sister.  ROS:   Review of Systems  Constitution: Negative for decreased appetite, fever and weight gain.  HENT: Negative for congestion, ear discharge, hoarse voice and sore throat.   Eyes: Negative for discharge, redness, vision loss in right eye and visual halos.  Cardiovascular: Negative for chest pain, dyspnea on exertion, leg swelling, orthopnea and palpitations.  Respiratory: Negative for cough, hemoptysis, shortness of breath and snoring.   Endocrine: Negative for heat intolerance and polyphagia.  Hematologic/Lymphatic: Negative for bleeding problem. Does not bruise/bleed easily.  Skin: Negative for flushing, nail changes, rash and suspicious lesions.  Musculoskeletal: Negative for arthritis, joint pain, muscle cramps, myalgias, neck pain and stiffness.  Gastrointestinal: Negative for abdominal pain, bowel incontinence, diarrhea and excessive appetite.   Genitourinary: Negative for decreased libido, genital sores and incomplete emptying.  Neurological: Negative for brief paralysis, focal weakness, headaches and loss of balance.  Psychiatric/Behavioral: Negative for altered mental status, depression and suicidal ideas.  Allergic/Immunologic: Negative for HIV exposure and persistent infections.    EKGs/Labs/Other Studies Reviewed:    The following studies were reviewed today:   EKG: None today  Pharmacologic nuclear stress test  The left ventricular ejection fraction is hyperdynamic (>65%).  Nuclear stress EF: 70%.  There was no ST segment deviation noted during stress.  No T wave inversion was noted during stress.  The study is normal.  This is a low risk study.  Transthoracic echocardiogram October 12, 2020 IMPRESSIONS    1. Left ventricular ejection fraction, by estimation, is 60 to 65%. The  left ventricle has normal function. The left ventricle has  no regional  wall motion abnormalities. Left ventricular diastolic parameters are  consistent with Grade I diastolic  dysfunction (impaired relaxation).  2. Right ventricular systolic function is normal. The right ventricular  size is normal. There is normal pulmonary artery systolic pressure.  3. The mitral valve is normal in structure. Mild mitral valve  regurgitation. No evidence of mitral stenosis.  4. The aortic valve is normal in structure. Aortic valve regurgitation is  not visualized. No aortic stenosis is present.  5. The inferior vena cava is normal in size with greater than 50%  respiratory variability, suggesting right atrial pressure of 3 mmHg.   Recent Labs: 10/27/2020: BUN 25; Creatinine, Ser 1.12; Hemoglobin 14.3; Platelets 274.0; Potassium 4.5; Sodium 139  Recent Lipid Panel No results found for: CHOL, TRIG, HDL, CHOLHDL, VLDL, LDLCALC, LDLDIRECT  Physical Exam:    VS:  BP 122/62   Pulse 72   Ht 5\' 6"  (1.676 m)   Wt 150 lb 9.6 oz (68.3 kg)    SpO2 98%   BMI 24.31 kg/m     Wt Readings from Last 3 Encounters:  12/12/20 150 lb 9.6 oz (68.3 kg)  11/15/20 145 lb (65.8 kg)  10/27/20 145 lb 3.2 oz (65.9 kg)     GEN: Well nourished, well developed in no acute distress HEENT: Normal NECK: No JVD; No carotid bruits LYMPHATICS: No lymphadenopathy CARDIAC: S1S2 noted,RRR, no murmurs, rubs, gallops RESPIRATORY:  Clear to auscultation without rales, wheezing or rhonchi  ABDOMEN: Soft, non-tender, non-distended, +bowel sounds, no guarding. EXTREMITIES: No edema, No cyanosis, no clubbing MUSCULOSKELETAL:  No deformity  SKIN: Warm and dry NEUROLOGIC:  Alert and oriented x 3, non-focal PSYCHIATRIC:  Normal affect, good insight  ASSESSMENT:    1. SOB (shortness of breath)   2. Hyperlipidemia, unspecified hyperlipidemia type   3. Fatigue, unspecified type   4. Metabolic syndrome   5. Dyslipidemia   6. Precordial pain   7. Chronic bronchitis, unspecified chronic bronchitis type ([place])    PLAN:     1.  She still has a sprain shortness of breath.  Her recent stress test was reported to be normal.  She has no chest pain.  She is concerned as she has had some elevated LFTs which is being worked up.  She needs to see GI in follow-up.  Have also encouraged patient that she needs to follow-up with pulmonary giving her shortness of breath could be likely due to worsening COPD.  The patient is in agreement with the above plan. The patient left the office in stable condition.  The patient will follow up in   Medication Adjustments/Labs and Tests Ordered: Current medicines are reviewed at length with the patient today.  Concerns regarding medicines are outlined above.  No orders of the defined types were placed in this encounter.  No orders of the defined types were placed in this encounter.   Patient Instructions  Medication Instructions:  Your physician has recommended you make the following change in your medication: STOP:  Lipitor  *If you need a refill on your cardiac medications before your next appointment, please call your pharmacy*   Lab Work: None If you have labs (blood work) drawn today and your tests are completely normal, you will receive your results only by: Marland Kitchen MyChart Message (if you have MyChart) OR . A paper copy in the mail If you have any lab test that is abnormal or we need to change your treatment, we will call you to review the results.  Testing/Procedures: None   Follow-Up: At [facility], you and your health needs are our priority.  As part of our continuing mission to provide you with exceptional heart care, we have created designated Provider Care Teams.  These Care Teams include your primary Cardiologist (physician) and Advanced Practice Providers (APPs -  Physician Assistants and Nurse Practitioners) who all work together to provide you with the care you need, when you need it.  We recommend signing up for the patient portal called "MyChart".  Sign up information is provided on this After Visit Summary.  MyChart is used to connect with patients for Virtual Visits (Telemedicine).  Patients are able to view lab/test results, encounter notes, upcoming appointments, etc.  Non-urgent messages can be sent to your provider as well.   To learn more about what you can do with MyChart, go to [email or web address].    Your next appointment:   6 month(s)  The format for your next appointment:   In Person  Provider:   Berniece Salines, DO   Other Instructions      Adopting a Healthy Lifestyle.  Know what a healthy weight is for you (roughly BMI <25) and aim to maintain this   Aim for 7+ servings of fruits and vegetables daily   65-80+ fluid ounces of water or unsweet tea for healthy kidneys   Limit to max 1 drink of alcohol per day; avoid smoking/tobacco   Limit animal fats in diet for cholesterol and heart health - choose grass fed whenever available   Avoid highly  processed foods, and foods high in saturated/trans fats   Aim for low stress - take time to unwind and care for your mental health   Aim for 150 min of moderate intensity exercise weekly for heart health, and weights twice weekly for bone health   Aim for 7-9 hours of sleep daily   When it comes to diets, agreement about the perfect plan isnt easy to find, even among the experts. Experts at the [place] developed an idea known as the Healthy Eating Plate. Just imagine a plate divided into logical, healthy portions.   The emphasis is on diet quality:   Load up on vegetables and fruits - one-half of your plate: Aim for color and variety, and remember that potatoes dont count.   Go for whole grains - one-quarter of your plate: Whole wheat, barley, wheat berries, quinoa, oats, brown rice, and foods made with them. If you want pasta, go with whole wheat pasta.   Protein power - one-quarter of your plate: Fish, chicken, beans, and nuts are all healthy, versatile protein sources. Limit red meat.   The diet, however, does go beyond the plate, offering a few other suggestions.   Use healthy plant oils, such as olive, canola, soy, corn, sunflower and peanut. Check the labels, and avoid partially hydrogenated oil, which have unhealthy trans fats.   If youre thirsty, drink water. Coffee and tea are good in moderation, but skip sugary drinks and limit milk and dairy products to one or two daily servings.   The type of carbohydrate in the diet is more important than the amount. Some sources of carbohydrates, such as vegetables, fruits, whole grains, and beans-are healthier than others.   Finally, stay active  Signed, Berniece Salines, DO  12/12/2020 8:48 AM    [place]

## 2020-12-13 ENCOUNTER — Encounter (HOSPITAL_BASED_OUTPATIENT_CLINIC_OR_DEPARTMENT_OTHER): Payer: Medicare Other | Admitting: Pulmonary Disease

## 2020-12-13 DIAGNOSIS — R945 Abnormal results of liver function studies: Secondary | ICD-10-CM | POA: Diagnosis not present

## 2020-12-13 DIAGNOSIS — R7989 Other specified abnormal findings of blood chemistry: Secondary | ICD-10-CM | POA: Diagnosis not present

## 2020-12-15 ENCOUNTER — Encounter: Payer: Self-pay | Admitting: Gastroenterology

## 2020-12-15 ENCOUNTER — Telehealth: Payer: Self-pay

## 2020-12-15 ENCOUNTER — Ambulatory Visit (INDEPENDENT_AMBULATORY_CARE_PROVIDER_SITE_OTHER): Payer: Medicare Other | Admitting: Gastroenterology

## 2020-12-15 ENCOUNTER — Other Ambulatory Visit (INDEPENDENT_AMBULATORY_CARE_PROVIDER_SITE_OTHER): Payer: Medicare Other

## 2020-12-15 VITALS — BP 142/88 | HR 76 | Ht 66.0 in | Wt 151.4 lb

## 2020-12-15 DIAGNOSIS — R748 Abnormal levels of other serum enzymes: Secondary | ICD-10-CM

## 2020-12-15 DIAGNOSIS — R1310 Dysphagia, unspecified: Secondary | ICD-10-CM

## 2020-12-15 LAB — HEPATIC FUNCTION PANEL
ALT: 25 U/L (ref 0–35)
AST: 12 U/L (ref 0–37)
Albumin: 3.6 g/dL (ref 3.5–5.2)
Alkaline Phosphatase: 77 U/L (ref 39–117)
Bilirubin, Direct: 0.1 mg/dL (ref 0.0–0.3)
Total Bilirubin: 0.7 mg/dL (ref 0.2–1.2)
Total Protein: 6.2 g/dL (ref 6.0–8.3)

## 2020-12-15 LAB — GAMMA GT: GGT: 67 U/L — ABNORMAL HIGH (ref 7–51)

## 2020-12-15 MED ORDER — PANTOPRAZOLE SODIUM 40 MG PO TBEC: 40.0000 mg | DELAYED_RELEASE_TABLET | Freq: Two times a day (BID) | ORAL | 11 refills | Status: DC

## 2020-12-15 NOTE — Patient Instructions (Addendum)
It was a pleasure to see you today. Based on our discussion, I am providing you with my recommendations below:  RECOMMENDATION(S):   I am recommending that you call to reschedule your Barium Esophagram.  I would like for you to continue your Pantoprazole 2 times daily  I would also like for you to have some labs completed today  I am recommending an endoscopy after you have seen pulmonology. You have mentioned that you have any appointment to PFT's completed and that you would like to call our office afterwards.  PRESCRIPTION MEDICATION(S):   To ensure you have apmple refills, we have sent the following medication(s) to your pharmacy:  . Pantoprazole - please take 40mg  by mouth 2 times daily  NOTE: If your medication(s) requires a PRIOR AUTHORIZATION, we will receive notification from your pharmacy. Once received, the process to submit for approval may take up to 7-10 business days. You will be contacted about any denials we have received from your insurance company as well as alternatives recommended by your provider.  LABS:   . Please proceed to the basement level for lab work before leaving today. Press "B" on the elevator. The lab is located at the first door on the left as you exit the elevator.  HEALTHCARE LAWS AND MY CHART RESULTS:   . Due to recent changes in healthcare laws, you may see results of your imaging and/or laboratory studies on MyChart before I have had a chance to review them.  I understand that in some cases there may be results that are confusing or concerning to you. Please understand that not all results are received at same time and often I may need to interpret multiple results in order to provide you with the best plan of care or course of treatment. Therefore, I ask that you please give me 48 hours to thoroughly review all your results before contacting my office for clarification.   IMAGING:  . Please call [facility] at [phone] to  schedule your Barium Esophagram appointment.    BMI:  . If you are age 74 or older, your body mass index should be between 23-30. Your There is no height or weight on file to calculate BMI. If this is out of the aforementioned range listed, please consider follow up with your Primary Care Provider.  Thank you for trusting me with your gastrointestinal care!    Thornton Park, MD, MPH

## 2020-12-15 NOTE — Telephone Encounter (Signed)
PULMONOLOGY CLEARANCE   Raynelle G Dethlefs 09/24/1946 [id number]  Procedure: Endoscopy Anesthesia type:  MAC Procedure Date: Pending Clearance Provider: Dr. Beavers  Type of Clearance needed: Pulmonology  Medication(s) needing held: N/A   Length of time for medication to be held: N/A  Please review request and advise by either responding to this message or by sending your response to the fax # provided below.  Thank you,  [facility]  Phone: [phone] Fax: [phone] ATTENTION: Trinitie Mcgirr, LPN 

## 2020-12-15 NOTE — Telephone Encounter (Signed)
Please review response received from Pulmonology and advise.

## 2020-12-15 NOTE — Telephone Encounter (Signed)
Tina George has an upcoming appointment with Dr. Erin Fulling. I recommend that she discuss this with him at that time. Thanks.

## 2020-12-15 NOTE — Progress Notes (Signed)
PULMONOLOGY CLEARANCE   Tina George April 19, 1946 [id number]  Procedure: Endoscopy Anesthesia type:  MAC Procedure Date: Pending Clearance Provider: Dr. Tarri Glenn  Type of Clearance needed: Pulmonology  Medication(s) needing held: N/A   Length of time for medication to be held: N/A  Please review request and advise by either responding to this message or by sending your response to the fax # provided below.  Thank you,  [facility]  Phone: [phone] Fax: [phone] ATTENTION: Georgena Weisheit, LPN

## 2020-12-15 NOTE — Progress Notes (Addendum)
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 Redmond Baseman  Dysphagia to solids and liquids: Some clinical improvement to PPI. Proceed with esophagram +/- EGD with biopsies +/- dilatation after cleared by pulmonary. We agreed that a follow-up office appointment would not be needed to schedule the EGD if clearance is provided.  Elevated alk phos: Elevated alkaline phosphatase. Obtain ultrasound report. Additional labs for evaluation.   Multiple first degree relatives with pancreatic cancer: Plan outpatient MRI/MRCP for pancreatic cancer screening after the dysphagia is evaluated. Will encourage genetic counseling.   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  PLAN: - Continue pantoprazole 40 mg BID - Barium esophagram - scheduled now - EGD with possible dilation and gastric mapping, performed after clearance by the pulmonologist - Hepatic function panel, GGTP, alk phos isoenzymes - Obtain ultrasound results from [place] - After reviewing labs and imaging, may need MRI/MRCP - Consider MRI/MRCP for pancreatic cancer screening, anyway  I spent 40 minutes, including in depth chart review, independent review of results, communicating results with the patient directly, face-to-face time with the patient, coordinating care, ordering studies and medications as appropriate, and documentation.  HPI: Tina George is a 75 y.o. female initially referred by Dr. [name] for dysphagia to solids and liquids. I saw her  in consultation 09/12/20. EGD was recommended but delayed until her dyspnea was fully evaluated. She returns today in follow-up. The interval history is obtained through the patient, her daughter who accompanies her to this appointment, and review of referral records. She has peripheral neuropathy, congestive heart failure, hypertension, COPD, anxiety, depression, stage 3 chronic kidney disease, hyperlipidemia, osteoarthritis. History of prior colectomy.   EGD had been recommended to evaluate her gradual onset of dysphagia to solids and liquids over the last 6 months. Localized to the sternal notch and to the xiphoid process. Associated with brash and chest pain. No odynophagia, sore throat, neck pain, dysphonia, abdominal pain, change in bowel habits. Concurrent difficulties with cough and shortness of breath during this time. Some improvement with dysphagia since starting pantoprazole in November, although her symptoms have not resolved.   Cleared by cardiology after nuclear stress test. Under evaluation by pulmonary with PFTs scheduled for next week. Continues to have dyspnea despite recent steroids and switch to [employer].    Mrs. Seelye also reports a recent ultrasound to evaluate abnormal liver enzymes. No prior knowledge of elevated liver enzymes. Alk phos elevated at 135 05/05/19. No other liver enzymes available for me to review today. Abdominal ultrasound was normal (in [place]) per daughter report  Prior GI records reviewed. Multiple high risk polyps on a colonoscopy in 2016 (multiple TVAs, villous adenomas, and TAs). Colonoscopy with Dr. Redmond Baseman in 2018 revealed no polyps. Repeat recommended in 2023.  EGD showed Atrophic gastritis with intestinal metaplasia 2016  Mother, brother, and sister with pancreatic cancer. No known family history of colon cancer or polyps. No family history of uterine/endometrial cancer or gastric/stomach cancer.   Past Medical History:  Diagnosis Date  . Bilateral leg  edema 09/15/2020  .  COPD (chronic obstructive pulmonary disease) ([place])   . Daytime somnolence 09/15/2020  . Depression   . Dyslipidemia   . GERD (gastroesophageal reflux disease)   . High blood pressure   . Hypokalemia   . Medication management 09/15/2020  . Murmur 09/15/2020  . Muscle cramps   . Other chest pain 09/15/2020  . Pain in both lower extremities 09/15/2020  . Precordial pain 09/15/2020  . Psoriasis   . Snoring 09/15/2020  . SOB (shortness of breath) 09/15/2020    Past Surgical History:  Procedure Laterality Date  . ABDOMINAL HYSTERECTOMY    . APPENDECTOMY    . arthroscopic shoulder surgery    . BACK SURGERY    . CATARACT EXTRACTION, BILATERAL    . CHOLECYSTECTOMY    . MASTECTOMY    . MASTOIDECTOMY    . PARTIAL COLECTOMY      Current Outpatient Medications  Medication Sig Dispense Refill  . albuterol (PROVENTIL HFA;VENTOLIN HFA) 108 (90 Base) MCG/ACT inhaler     . ALPRAZolam (XANAX) 0.25 MG tablet Take 0.25 mg by mouth every 6 (six) hours as needed.    Marland Kitchen amLODipine (NORVASC) 5 MG tablet Take 5 mg by mouth daily.    . Budeson-Glycopyrrol-Formoterol (BREZTRI AEROSPHERE) 160-9-4.8 MCG/ACT AERO Inhale 2 puffs into the lungs in the morning and at bedtime. 23.6 g 0  . diltiazem (CARDIZEM CD) 120 MG 24 hr capsule Take 1 capsule (120 mg total) by mouth daily. 90 capsule 3  . doxycycline (VIBRA-TABS) 100 MG tablet Take 100 mg by mouth 2 (two) times daily.    . famotidine (PEPCID) 20 MG tablet Take 20 mg by mouth 2 (two) times daily.    . fluconazole (DIFLUCAN) 150 MG tablet Take 150 mg by mouth daily.    . furosemide (LASIX) 20 MG tablet Take 1 tablet (20 mg total) by mouth as directed. Take 2 tablets(40 mg) by mouth every Monday, Wednesday, Friday and Sunday. Take 1 tablet (20 mg) by mouth every Tuesday, Thursday and Saturday. 180 tablet 3  . gabapentin (NEURONTIN) 300 MG capsule Take 300 mg by mouth at bedtime.    Marland Kitchen ibuprofen (ADVIL,MOTRIN) 200 MG tablet Take 200 mg by mouth.     Marland Kitchen ipratropium-albuterol (DUONEB) 0.5-2.5 (3) MG/3ML SOLN Take 3 mLs by nebulization every 4 (four) hours as needed.    . pantoprazole (PROTONIX) 40 MG tablet Take 1 tablet (40 mg total) by mouth 2 (two) times daily. 60 tablet 2  . PARoxetine (PAXIL) 40 MG tablet Take 40 mg by mouth every morning.    . Potassium 99 MG TABS Take 2 tablets by mouth daily.    . predniSONE (DELTASONE) 10 MG tablet Take 1 tablet (10 mg total) by mouth daily with breakfast. 30 tablet 0  . rOPINIRole (REQUIP) 3 MG tablet Take 3 mg by mouth at bedtime.    . rosuvastatin (CRESTOR) 10 MG tablet Take 10 mg by mouth daily.    Marland Kitchen spironolactone (ALDACTONE) 50 MG tablet Take 50 mg by mouth daily.    . traMADol (ULTRAM) 50 MG tablet Take 50 mg by mouth 3 (three) times daily as needed for pain.     No current facility-administered medications for this visit.    Allergies as of 12/15/2020 - Review Complete 12/12/2020  Allergen Reaction Noted  . Aspirin  08/17/2018  . Codeine  08/17/2018  . Levaquin [levofloxacin in d5w]  08/17/2018    Family History  Problem Relation Age of Onset  . Pancreatic cancer Mother   .  Hypertension Father   . Heart disease Father   . Pancreatic cancer Sister   . Pancreatic cancer Brother     Physical Exam: General:   Alert, in NAD. No scleral icterus. No bilateral temporal wasting. Hearing loss.  Heart:  Regular rate and rhythm; no murmurs Pulm: Clear anteriorly; no wheezing Abdomen:  Soft. Nontender. Nondistended. Normal bowel sounds. No rebound or guarding. No fluid wave. No hepatosplenomegaly.  LAD: No inguinal or umbilical LAD Extremities:  Without edema. Neurologic:  Alert and  oriented x4;  grossly normal neurologically; no asterixis or clonus. Skin: No jaundice. No palmar erythema or spider angioma. No xanthelasma.  Psych:  Alert and cooperative. Normal mood and affect.     Nyeemah Jennette L. Tarri Glenn, MD, MPH 12/15/2020, 11:15 AM

## 2020-12-15 NOTE — Telephone Encounter (Signed)
Patient's Ariscat respiratory complication risk is 6.1% to 13.3% for post-procedure respiratory complications of any severity.   Freda Jackson, MD [facility] Office: [phone]   See Amion for Pager Details

## 2020-12-19 DIAGNOSIS — E785 Hyperlipidemia, unspecified: Secondary | ICD-10-CM | POA: Diagnosis not present

## 2020-12-19 DIAGNOSIS — R7989 Other specified abnormal findings of blood chemistry: Secondary | ICD-10-CM | POA: Diagnosis not present

## 2020-12-19 DIAGNOSIS — R0600 Dyspnea, unspecified: Secondary | ICD-10-CM | POA: Diagnosis not present

## 2020-12-19 DIAGNOSIS — M159 Polyosteoarthritis, unspecified: Secondary | ICD-10-CM | POA: Diagnosis not present

## 2020-12-19 DIAGNOSIS — F17200 Nicotine dependence, unspecified, uncomplicated: Secondary | ICD-10-CM | POA: Diagnosis not present

## 2020-12-19 DIAGNOSIS — H9193 Unspecified hearing loss, bilateral: Secondary | ICD-10-CM | POA: Diagnosis not present

## 2020-12-19 DIAGNOSIS — E876 Hypokalemia: Secondary | ICD-10-CM | POA: Diagnosis not present

## 2020-12-19 DIAGNOSIS — N1832 Chronic kidney disease, stage 3b: Secondary | ICD-10-CM | POA: Diagnosis not present

## 2020-12-19 DIAGNOSIS — J449 Chronic obstructive pulmonary disease, unspecified: Secondary | ICD-10-CM | POA: Diagnosis not present

## 2020-12-19 DIAGNOSIS — M8080XA Other osteoporosis with current pathological fracture, unspecified site, initial encounter for fracture: Secondary | ICD-10-CM | POA: Diagnosis not present

## 2020-12-19 DIAGNOSIS — I10 Essential (primary) hypertension: Secondary | ICD-10-CM | POA: Diagnosis not present

## 2020-12-19 DIAGNOSIS — F419 Anxiety disorder, unspecified: Secondary | ICD-10-CM | POA: Diagnosis not present

## 2020-12-19 NOTE — Telephone Encounter (Signed)
Appointment Information  Name: Tina George, Tina George MRN: [id number]  Date: 12/29/2020 Status: Sch  Time: 12:00 PM Length: 15  Visit Type: OFFICE VISIT [1004] Copay: $0.00  Provider: Freddi Starr, MD Department: [facility]ARE  Referring Provider: [name] CSN: [id number]  Notes:    Made On: 12/01/2020 8:56 AM By: Fontaine No   Will follow up on above mentioned date to ensure pt has been cleared to proceed with proposed procedure

## 2020-12-20 LAB — ALKALINE PHOSPHATASE, ISOENZYMES
Alkaline Phosphatase: 95 IU/L (ref 44–121)
BONE FRACTION: 32 % (ref 14–68)
INTESTINAL FRAC.: 0 % (ref 0–18)
LIVER FRACTION: 68 % (ref 18–85)

## 2020-12-25 ENCOUNTER — Other Ambulatory Visit (HOSPITAL_COMMUNITY)
Admission: RE | Admit: 2020-12-25 | Discharge: 2020-12-25 | Disposition: A | Discharge status: Home / Self Care | Payer: Medicare Other | Source: Ambulatory Visit | Attending: Gastroenterology | Admitting: Gastroenterology

## 2020-12-25 DIAGNOSIS — Z20822 Contact with and (suspected) exposure to covid-19: Secondary | ICD-10-CM | POA: Diagnosis not present

## 2020-12-25 DIAGNOSIS — Z01812 Encounter for preprocedural laboratory examination: Secondary | ICD-10-CM | POA: Insufficient documentation

## 2020-12-26 LAB — SARS CORONAVIRUS 2 (TAT 6-24 HRS): SARS Coronavirus 2: NEGATIVE

## 2020-12-29 ENCOUNTER — Ambulatory Visit: Payer: Medicare Other | Admitting: Pulmonary Disease

## 2020-12-29 ENCOUNTER — Encounter: Payer: Self-pay | Admitting: Pulmonary Disease

## 2020-12-29 ENCOUNTER — Other Ambulatory Visit: Payer: Self-pay

## 2020-12-29 ENCOUNTER — Ambulatory Visit (INDEPENDENT_AMBULATORY_CARE_PROVIDER_SITE_OTHER): Payer: Medicare Other | Admitting: Pulmonary Disease

## 2020-12-29 VITALS — BP 118/78 | HR 67 | Temp 97.5°F | Ht 66.0 in | Wt 150.2 lb

## 2020-12-29 DIAGNOSIS — J4540 Moderate persistent asthma, uncomplicated: Secondary | ICD-10-CM

## 2020-12-29 DIAGNOSIS — J4541 Moderate persistent asthma with (acute) exacerbation: Secondary | ICD-10-CM

## 2020-12-29 DIAGNOSIS — Z720 Tobacco use: Secondary | ICD-10-CM | POA: Diagnosis not present

## 2020-12-29 DIAGNOSIS — J439 Emphysema, unspecified: Secondary | ICD-10-CM | POA: Diagnosis not present

## 2020-12-29 LAB — PULMONARY FUNCTION TEST
DL/VA % pred: 95 %
DL/VA: 3.87 ml/min/mmHg/L
DLCO cor % pred: 59 %
DLCO cor: 12.14 ml/min/mmHg
DLCO unc % pred: 59 %
DLCO unc: 12.14 ml/min/mmHg
FEF 25-75 Post: 1.3 L/sec
FEF 25-75 Pre: 0.96 L/sec
FEF2575-%Change-Post: 35 %
FEF2575-%Pred-Post: 71 %
FEF2575-%Pred-Pre: 52 %
FEV1-%Change-Post: 7 %
FEV1-%Pred-Post: 65 %
FEV1-%Pred-Pre: 61 %
FEV1-Post: 1.54 L
FEV1-Pre: 1.43 L
FEV1FVC-%Change-Post: 3 %
FEV1FVC-%Pred-Pre: 96 %
FEV6-%Change-Post: 3 %
FEV6-%Pred-Post: 68 %
FEV6-%Pred-Pre: 66 %
FEV6-Post: 2.03 L
FEV6-Pre: 1.96 L
FEV6FVC-%Change-Post: 0 %
FEV6FVC-%Pred-Post: 104 %
FEV6FVC-%Pred-Pre: 103 %
FVC-%Change-Post: 3 %
FVC-%Pred-Post: 65 %
FVC-%Pred-Pre: 63 %
FVC-Post: 2.04 L
FVC-Pre: 1.97 L
Post FEV1/FVC ratio: 75 %
Post FEV6/FVC ratio: 100 %
Pre FEV1/FVC ratio: 73 %
Pre FEV6/FVC Ratio: 99 %
RV % pred: 104 %
RV: 2.48 L
TLC % pred: 86 %
TLC: 4.61 L

## 2020-12-29 MED ORDER — BREZTRI AEROSPHERE 160-9-4.8 MCG/ACT IN AERO: 2.0000 | INHALATION_SPRAY | Freq: Two times a day (BID) | RESPIRATORY_TRACT | 0 refills | Status: DC

## 2020-12-29 NOTE — Patient Instructions (Addendum)
Continue breztri 2 puffs twice daily  Continue albuterol as needed  Start using 14mg  nicotine patches daily and reduced to 7mg  patches in 2-4 weeks. Can continue on 7mg  patches for another 2-4 weeks and then stop once comfortable without cigarette cravings.

## 2020-12-29 NOTE — Progress Notes (Signed)
PFT done today. 

## 2020-12-29 NOTE — Progress Notes (Signed)
Synopsis: Referred in 10/2020 for COPD  Subjective:   PATIENT ID: Tina George GENDER: female DOB: 1946-05-31, MRN: [id number]   HPI  Chief Complaint  Patient presents with  . Follow-up    F/U after PFT. States her breathing has improved since last visit. Tina George is working but she is not able to afford it. With insurance, inhaler costs $500. Wants to be cleared for endoscopy.   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   She was transitioned to [place] 2 puffs twice daily from Symbicort with significant improvement in her breathing and has not required further steroid courses since last visit.  She continues to smoke half a pack per day. She lives with her daughter who also smokes. There is significant family history of asthma of multiple generations. Her daughter reports significant snoring events at night and apneic events as well. She also complains of heart burn symptoms. She denies sinus congestion or drainage.     Past Medical History:  Diagnosis Date  . Bilateral leg edema 09/15/2020  . COPD (chronic obstructive pulmonary disease) ([place])   . Daytime somnolence 09/15/2020  . Depression   . Dyslipidemia   . GERD (gastroesophageal reflux disease)   . High blood pressure   . Hypokalemia   . Medication management 09/15/2020  . Murmur 09/15/2020  . Muscle cramps   . Other chest pain 09/15/2020  . Pain in both lower extremities 09/15/2020  . Precordial pain 09/15/2020  . Psoriasis   . Snoring 09/15/2020  . SOB (shortness of breath) 09/15/2020     Family History  Problem Relation Age of Onset  . Pancreatic cancer Mother   . Hypertension Father   . Heart disease Father   . Pancreatic cancer Sister   . Pancreatic cancer Brother      Social History   Socioeconomic History  . Marital status: Widowed    Spouse name: Not on file  . Number of children: 2  .  Years of education: Not on file  . Highest education level: Not on file  Occupational History  . Not on file  Tobacco Use  . Smoking status: Current Every Day Smoker    Packs/day: 0.50  . Smokeless tobacco: Never Used  Substance and Sexual Activity  . Alcohol use: Never  . Drug use: Never  . Sexual activity: Not on file  Other Topics Concern  . Not on file  Social History Narrative  . Not on file   Social Determinants of Health   Financial Resource Strain: Not on file  Food Insecurity: Not on file  Transportation Needs: Not on file  Physical Activity: Not on file  Stress: Not on file  Social Connections: Not on file  Intimate Partner Violence: Not on file     Allergies  Allergen Reactions  . Aspirin   . Codeine   . Levaquin [Levofloxacin In D5w]      Outpatient Medications Prior to Visit  Medication Sig Dispense Refill  . albuterol (PROVENTIL HFA;VENTOLIN HFA) 108 (90 Base) MCG/ACT inhaler     . ALPRAZolam (XANAX) 0.25 MG tablet Take 0.25 mg by mouth every 6 (six) hours as needed.    Marland Kitchen amLODipine (NORVASC) 5 MG tablet Take 5 mg by mouth daily.    . Budeson-Glycopyrrol-Formoterol (BREZTRI AEROSPHERE) 160-9-4.8 MCG/ACT AERO Inhale 2 puffs into the lungs in the morning and at bedtime. 23.6 g 0  .  diltiazem (CARDIZEM CD) 120 MG 24 hr capsule Take 1 capsule (120 mg total) by mouth daily. 90 capsule 3  . gabapentin (NEURONTIN) 300 MG capsule Take 300 mg by mouth at bedtime.    Marland Kitchen ibuprofen (ADVIL,MOTRIN) 200 MG tablet Take 200 mg by mouth.    Marland Kitchen ipratropium-albuterol (DUONEB) 0.5-2.5 (3) MG/3ML SOLN Take 3 mLs by nebulization every 4 (four) hours as needed.    . pantoprazole (PROTONIX) 40 MG tablet Take 1 tablet (40 mg total) by mouth 2 (two) times daily. 60 tablet 11  . PARoxetine (PAXIL) 40 MG tablet Take 40 mg by mouth every morning.    Marland Kitchen rOPINIRole (REQUIP) 3 MG tablet Take 3 mg by mouth at bedtime.    Marland Kitchen spironolactone (ALDACTONE) 50 MG tablet Take 50 mg by mouth daily.      No facility-administered medications prior to visit.    Review of Systems  Constitutional: Negative for chills, fever, malaise/fatigue and weight loss.  HENT: Negative for congestion, sinus pain and sore throat.   Eyes: Negative.   Respiratory: Positive for cough. Negative for hemoptysis, sputum production, shortness of breath and wheezing.   Cardiovascular: Negative for chest pain, palpitations, orthopnea, claudication, leg swelling and PND.  Gastrointestinal: Positive for heartburn. Negative for abdominal pain, nausea and vomiting.  Genitourinary: Negative.   Musculoskeletal: Negative.   Skin: Negative for rash.  Neurological: Negative.   Endo/Heme/Allergies: Negative.   Psychiatric/Behavioral: Negative.    Objective:   Vitals:   12/29/20 1207  BP: 118/78  Pulse: 67  Temp: (!) 97.5 F (36.4 C)  TempSrc: Temporal  SpO2: 97%  Weight: 150 lb 3.2 oz (68.1 kg)  Height: 5\' 6"  (1.676 m)     Physical Exam Constitutional:      General: She is not in acute distress. HENT:     Head: Normocephalic and atraumatic.  Eyes:     General: No scleral icterus.    Conjunctiva/sclera: Conjunctivae normal.     Pupils: Pupils are equal, round, and reactive to light.  Cardiovascular:     Rate and Rhythm: Normal rate and regular rhythm.     Pulses: Normal pulses.     Heart sounds: Normal heart sounds. No murmur heard.   Pulmonary:     Effort: Pulmonary effort is normal.     Breath sounds: No wheezing, rhonchi or rales.  Abdominal:     General: Bowel sounds are normal.     Palpations: Abdomen is soft.  Musculoskeletal:     Right lower leg: No edema.     Left lower leg: No edema.  Skin:    General: Skin is warm and dry.  Neurological:     General: No focal deficit present.     Mental Status: She is alert.  Psychiatric:        Mood and Affect: Mood normal.        Behavior: Behavior normal.        Thought Content: Thought content normal.        Judgment: Judgment normal.      CBC    Component Value Date/Time   WBC 10.9 (H) 10/27/2020 1513   RBC 4.58 10/27/2020 1513   HGB 14.3 10/27/2020 1513   HCT 42.2 10/27/2020 1513   PLT 274.0 10/27/2020 1513   MCV 92.2 10/27/2020 1513   MCHC 34.0 10/27/2020 1513   RDW 15.0 10/27/2020 1513   LYMPHSABS 1.5 10/27/2020 1513   MONOABS 0.8 10/27/2020 1513   EOSABS 0.0 10/27/2020 1513   BASOSABS  0.1 10/27/2020 1513   BMP Latest Ref Rng & Units 10/27/2020 05/05/2019  Glucose 70 - 99 mg/dL 94 92  BUN 6 - 23 mg/dL 25(H) 13  Creatinine 0.40 - 1.20 mg/dL 1.12 0.97  BUN/Creat Ratio 12 - 28 - 13  Sodium 135 - 145 mEq/L 139 144  Potassium 3.5 - 5.1 mEq/L 4.5 5.0  Chloride 96 - 112 mEq/L 104 104  CO2 19 - 32 mEq/L 28 24  Calcium 8.4 - 10.5 mg/dL 9.7 9.5    Chest imaging: CXR 10/27/2020 Capsular calcification at BILATERAL breast prostheses. Normal heart size, mediastinal contours, and pulmonary vascularity. Atherosclerotic calcification aorta. Emphysematous and minimal bronchitic changes consistent with COPD. No acute infiltrate, pleural effusion, or pneumothorax. Scattered endplate spur formation thoracic spine.  PFT: PFT Results Latest Ref Rng & Units 12/29/2020  FVC-Pre L 1.97  FVC-Predicted Pre % 63  FVC-Post L 2.04  FVC-Predicted Post % 65  Pre FEV1/FVC % % 73  Post FEV1/FCV % % 75  FEV1-Pre L 1.43  FEV1-Predicted Pre % 61  FEV1-Post L 1.54  DLCO uncorrected ml/min/mmHg 12.14  DLCO UNC% % 59  DLCO corrected ml/min/mmHg 12.14  DLCO COR %Predicted % 59  DLVA Predicted % 95  TLC L 4.61  TLC % Predicted % 86  RV % Predicted % 104  PFTs show reduced FEV1 and FVC with normal TLC and reduced DLCO.   Echo: 10/12/20 1. Left ventricular ejection fraction, by estimation, is 60 to 65%. The  left ventricle has normal function. The left ventricle has no regional  wall motion abnormalities. Left ventricular diastolic parameters are  consistent with Grade I diastolic  dysfunction (impaired relaxation).  2.  Right ventricular systolic function is normal. The right ventricular  size is normal. There is normal pulmonary artery systolic pressure.  3. The mitral valve is normal in structure. Mild mitral valve  regurgitation. No evidence of mitral stenosis.  4. The aortic valve is normal in structure. Aortic valve regurgitation is  not visualized. No aortic stenosis is present.  5. The inferior vena cava is normal in size with greater than 50%  respiratory variability, suggesting right atrial pressure of 3 mmHg.     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   She does have signs of emphysema on chest imaging along with a reduced diffusion capacity on PFTs today.   She is to continue breztri inhaler 2 puffs twice daily and as needed albuterol.  She has been encouraged to quit smoking. Nicotine replacement therapy with patches/lozenges/gum has been recommended.   We will also schedule her for a split night sleep study for concerns of obstructive sleep apnea. In lab study chosen due to her degree of underlying pulmonary disease.  Patient's [place] respiratory complication risk is 5.8% to 13.3% for post-procedure respiratory complications of any severity. This places her in a lower to moderate risk category. She is optimized from a respiratory standpoint as she is on appropriate inhaler therapy at this time. Consider post-procedure CPAP or bipap until the anesthesia effects have completely worn off.  Follow up in 4 months.  Tina Jackson, MD [facility] Office: [phone]    Current Outpatient Medications:  .  albuterol (PROVENTIL HFA;VENTOLIN HFA) 108 (90 Base) MCG/ACT inhaler, , Disp: ,  Rfl:  .   ALPRAZolam (XANAX) 0.25 MG tablet, Take 0.25 mg by mouth every 6 (six) hours as needed., Disp: , Rfl:  .  amLODipine (NORVASC) 5 MG tablet, Take 5 mg by mouth daily., Disp: , Rfl:  .  Budeson-Glycopyrrol-Formoterol (BREZTRI AEROSPHERE) 160-9-4.8 MCG/ACT AERO, Inhale 2 puffs into the lungs in the morning and at bedtime., Disp: 23.6 g, Rfl: 0 .  Budeson-Glycopyrrol-Formoterol (BREZTRI AEROSPHERE) 160-9-4.8 MCG/ACT AERO, Inhale 2 puffs into the lungs in the morning and at bedtime., Disp: 42.8 g, Rfl: 0 .  diltiazem (CARDIZEM CD) 120 MG 24 hr capsule, Take 1 capsule (120 mg total) by mouth daily., Disp: 90 capsule, Rfl: 3 .  gabapentin (NEURONTIN) 300 MG capsule, Take 300 mg by mouth at bedtime., Disp: , Rfl:  .  ibuprofen (ADVIL,MOTRIN) 200 MG tablet, Take 200 mg by mouth., Disp: , Rfl:  .  ipratropium-albuterol (DUONEB) 0.5-2.5 (3) MG/3ML SOLN, Take 3 mLs by nebulization every 4 (four) hours as needed., Disp: , Rfl:  .  pantoprazole (PROTONIX) 40 MG tablet, Take 1 tablet (40 mg total) by mouth 2 (two) times daily., Disp: 60 tablet, Rfl: 11 .  PARoxetine (PAXIL) 40 MG tablet, Take 40 mg by mouth every morning., Disp: , Rfl:  .  rOPINIRole (REQUIP) 3 MG tablet, Take 3 mg by mouth at bedtime., Disp: , Rfl:  .  spironolactone (ALDACTONE) 50 MG tablet, Take 50 mg by mouth daily., Disp: , Rfl:

## 2021-01-02 DIAGNOSIS — I10 Essential (primary) hypertension: Secondary | ICD-10-CM | POA: Diagnosis not present

## 2021-01-02 DIAGNOSIS — R42 Dizziness and giddiness: Secondary | ICD-10-CM | POA: Diagnosis not present

## 2021-01-02 DIAGNOSIS — E876 Hypokalemia: Secondary | ICD-10-CM | POA: Diagnosis not present

## 2021-01-02 DIAGNOSIS — M159 Polyosteoarthritis, unspecified: Secondary | ICD-10-CM | POA: Diagnosis not present

## 2021-01-02 DIAGNOSIS — F17200 Nicotine dependence, unspecified, uncomplicated: Secondary | ICD-10-CM | POA: Diagnosis not present

## 2021-01-02 DIAGNOSIS — F419 Anxiety disorder, unspecified: Secondary | ICD-10-CM | POA: Diagnosis not present

## 2021-01-02 DIAGNOSIS — J449 Chronic obstructive pulmonary disease, unspecified: Secondary | ICD-10-CM | POA: Diagnosis not present

## 2021-01-02 DIAGNOSIS — H9193 Unspecified hearing loss, bilateral: Secondary | ICD-10-CM | POA: Diagnosis not present

## 2021-01-02 DIAGNOSIS — R7989 Other specified abnormal findings of blood chemistry: Secondary | ICD-10-CM | POA: Diagnosis not present

## 2021-01-02 DIAGNOSIS — M8080XA Other osteoporosis with current pathological fracture, unspecified site, initial encounter for fracture: Secondary | ICD-10-CM | POA: Diagnosis not present

## 2021-01-02 DIAGNOSIS — R0600 Dyspnea, unspecified: Secondary | ICD-10-CM | POA: Diagnosis not present

## 2021-01-02 DIAGNOSIS — E785 Hyperlipidemia, unspecified: Secondary | ICD-10-CM | POA: Diagnosis not present

## 2021-01-03 ENCOUNTER — Encounter: Payer: Self-pay | Admitting: Pulmonary Disease

## 2021-01-04 ENCOUNTER — Telehealth: Payer: Self-pay | Admitting: Pulmonary Disease

## 2021-01-04 NOTE — Telephone Encounter (Signed)
Completed patient assistance forms for [facility] were faxed today to [employer]. Will keep forms in my follow up folder for 2 weeks before sending them to be scanned into her chart.

## 2021-01-08 NOTE — Progress Notes (Signed)
Notes from pulmonologist reviewed. Please schedule EGD at the hospital given the potential post-procedure care needed. Thank you.

## 2021-01-10 NOTE — Telephone Encounter (Signed)
Curious, when I received the office note from Dr. Erin Fulling, I forwarded it to the nursing team asking them to schedule the procedures at the hospital given his recommendation for possible CPAP or bipap immediately following the procedure. Thanks.

## 2021-01-10 NOTE — Telephone Encounter (Signed)
Please review Dr. Tarri Glenn response/question below.

## 2021-01-10 NOTE — Telephone Encounter (Signed)
Pt not yet scheduled for Endo. Following documented per Dr. Erin Fulling re: Pulmonary risk:  Patient's [place]at respiratory complication risk is 4.3% to 13.3% for post-procedure respiratory complications of any severity. This places her in a lower to moderate risk category. She is optimized from a respiratory standpoint as she is on appropriate inhaler therapy at this time. Consider post-procedure CPAP or bipap until the anesthesia effects have completely worn off.  Forwarding to Dr. Tarri Glenn to determine if pt should be scheduled for procedure.

## 2021-01-10 NOTE — Telephone Encounter (Signed)
Note was received from Dr. Tarri Glenn on 12/11/20. Thursday afternoon I hope to back in triage and can work on scheduling the procedure at that time. I have been pulling back all day yesterday and today and am scheduled to do the same for a half a day tomorrow.

## 2021-01-12 ENCOUNTER — Other Ambulatory Visit: Payer: Self-pay

## 2021-01-12 DIAGNOSIS — R1310 Dysphagia, unspecified: Secondary | ICD-10-CM

## 2021-01-12 NOTE — Progress Notes (Signed)
May proceed with MRI with and without contrast for pancreatic cancer screening given her family history if her insurance will cover. Multiple family members with pancreatic cancer as documented in her last outpatient clinic note. Thanks.

## 2021-01-12 NOTE — Progress Notes (Signed)
Pt has decided not to have procedure after taking to her pulmonary doctor. Dr. Tarri Glenn aware.

## 2021-01-16 DIAGNOSIS — I10 Essential (primary) hypertension: Secondary | ICD-10-CM | POA: Diagnosis not present

## 2021-01-16 DIAGNOSIS — F17200 Nicotine dependence, unspecified, uncomplicated: Secondary | ICD-10-CM | POA: Diagnosis not present

## 2021-01-16 DIAGNOSIS — R7989 Other specified abnormal findings of blood chemistry: Secondary | ICD-10-CM | POA: Diagnosis not present

## 2021-01-16 DIAGNOSIS — R0600 Dyspnea, unspecified: Secondary | ICD-10-CM | POA: Diagnosis not present

## 2021-01-16 DIAGNOSIS — M8080XA Other osteoporosis with current pathological fracture, unspecified site, initial encounter for fracture: Secondary | ICD-10-CM | POA: Diagnosis not present

## 2021-01-16 DIAGNOSIS — E785 Hyperlipidemia, unspecified: Secondary | ICD-10-CM | POA: Diagnosis not present

## 2021-01-16 DIAGNOSIS — M159 Polyosteoarthritis, unspecified: Secondary | ICD-10-CM | POA: Diagnosis not present

## 2021-01-16 DIAGNOSIS — H9193 Unspecified hearing loss, bilateral: Secondary | ICD-10-CM | POA: Diagnosis not present

## 2021-01-16 DIAGNOSIS — E876 Hypokalemia: Secondary | ICD-10-CM | POA: Diagnosis not present

## 2021-01-16 DIAGNOSIS — R42 Dizziness and giddiness: Secondary | ICD-10-CM | POA: Diagnosis not present

## 2021-01-16 DIAGNOSIS — F419 Anxiety disorder, unspecified: Secondary | ICD-10-CM | POA: Diagnosis not present

## 2021-01-16 DIAGNOSIS — J449 Chronic obstructive pulmonary disease, unspecified: Secondary | ICD-10-CM | POA: Diagnosis not present

## 2021-01-19 ENCOUNTER — Other Ambulatory Visit: Payer: Self-pay

## 2021-01-19 DIAGNOSIS — Z800 Family history of malignant neoplasm of digestive organs: Secondary | ICD-10-CM

## 2021-01-19 NOTE — Progress Notes (Signed)
Order entered in epic for MR of abd w/wo for family history of pancreatic cancer. Pt will receive a call from rad scheduling to set up appt.

## 2021-01-29 ENCOUNTER — Ambulatory Visit (HOSPITAL_COMMUNITY)
Admission: RE | Admit: 2021-01-29 | Discharge: 2021-01-29 | Disposition: A | Discharge status: Home / Self Care | Payer: Medicare Other | Source: Ambulatory Visit | Attending: Gastroenterology | Admitting: Gastroenterology

## 2021-01-29 ENCOUNTER — Other Ambulatory Visit: Payer: Self-pay

## 2021-01-29 ENCOUNTER — Other Ambulatory Visit: Payer: Self-pay | Admitting: Gastroenterology

## 2021-01-29 DIAGNOSIS — K760 Fatty (change of) liver, not elsewhere classified: Secondary | ICD-10-CM | POA: Diagnosis not present

## 2021-01-29 DIAGNOSIS — Z800 Family history of malignant neoplasm of digestive organs: Secondary | ICD-10-CM | POA: Diagnosis not present

## 2021-01-29 DIAGNOSIS — K5710 Diverticulosis of small intestine without perforation or abscess without bleeding: Secondary | ICD-10-CM | POA: Insufficient documentation

## 2021-01-29 DIAGNOSIS — K5711 Diverticulosis of small intestine without perforation or abscess with bleeding: Secondary | ICD-10-CM | POA: Diagnosis not present

## 2021-01-29 DIAGNOSIS — K8050 Calculus of bile duct without cholangitis or cholecystitis without obstruction: Secondary | ICD-10-CM | POA: Diagnosis not present

## 2021-01-29 MED ORDER — GADOBUTROL 1 MMOL/ML IV SOLN
6.0000 mL | Freq: Once | INTRAVENOUS | Status: AC | PRN
  Administered 2021-01-29: 5 mL via INTRAVENOUS

## 2021-02-13 DIAGNOSIS — I10 Essential (primary) hypertension: Secondary | ICD-10-CM | POA: Diagnosis not present

## 2021-02-13 DIAGNOSIS — H9193 Unspecified hearing loss, bilateral: Secondary | ICD-10-CM | POA: Diagnosis not present

## 2021-02-13 DIAGNOSIS — E785 Hyperlipidemia, unspecified: Secondary | ICD-10-CM | POA: Diagnosis not present

## 2021-02-13 DIAGNOSIS — R0600 Dyspnea, unspecified: Secondary | ICD-10-CM | POA: Diagnosis not present

## 2021-02-13 DIAGNOSIS — F17200 Nicotine dependence, unspecified, uncomplicated: Secondary | ICD-10-CM | POA: Diagnosis not present

## 2021-02-13 DIAGNOSIS — E876 Hypokalemia: Secondary | ICD-10-CM | POA: Diagnosis not present

## 2021-02-13 DIAGNOSIS — M159 Polyosteoarthritis, unspecified: Secondary | ICD-10-CM | POA: Diagnosis not present

## 2021-02-13 DIAGNOSIS — R7989 Other specified abnormal findings of blood chemistry: Secondary | ICD-10-CM | POA: Diagnosis not present

## 2021-02-13 DIAGNOSIS — F419 Anxiety disorder, unspecified: Secondary | ICD-10-CM | POA: Diagnosis not present

## 2021-02-13 DIAGNOSIS — N1832 Chronic kidney disease, stage 3b: Secondary | ICD-10-CM | POA: Diagnosis not present

## 2021-02-13 DIAGNOSIS — M8080XA Other osteoporosis with current pathological fracture, unspecified site, initial encounter for fracture: Secondary | ICD-10-CM | POA: Diagnosis not present

## 2021-02-13 DIAGNOSIS — J449 Chronic obstructive pulmonary disease, unspecified: Secondary | ICD-10-CM | POA: Diagnosis not present

## 2021-02-16 ENCOUNTER — Other Ambulatory Visit (HOSPITAL_COMMUNITY): Payer: Medicare Other

## 2021-02-20 ENCOUNTER — Encounter (HOSPITAL_COMMUNITY): Payer: Self-pay

## 2021-02-20 ENCOUNTER — Ambulatory Visit (HOSPITAL_COMMUNITY): Admit: 2021-02-20 | Payer: Medicare Other | Admitting: Gastroenterology

## 2021-02-20 DIAGNOSIS — Z6822 Body mass index (BMI) 22.0-22.9, adult: Secondary | ICD-10-CM | POA: Diagnosis not present

## 2021-02-20 DIAGNOSIS — H9193 Unspecified hearing loss, bilateral: Secondary | ICD-10-CM | POA: Diagnosis not present

## 2021-02-20 DIAGNOSIS — E876 Hypokalemia: Secondary | ICD-10-CM | POA: Diagnosis not present

## 2021-02-20 DIAGNOSIS — E785 Hyperlipidemia, unspecified: Secondary | ICD-10-CM | POA: Diagnosis not present

## 2021-02-20 DIAGNOSIS — I10 Essential (primary) hypertension: Secondary | ICD-10-CM | POA: Diagnosis not present

## 2021-02-20 DIAGNOSIS — G629 Polyneuropathy, unspecified: Secondary | ICD-10-CM | POA: Diagnosis not present

## 2021-02-20 DIAGNOSIS — F3341 Major depressive disorder, recurrent, in partial remission: Secondary | ICD-10-CM | POA: Diagnosis not present

## 2021-02-20 DIAGNOSIS — R252 Cramp and spasm: Secondary | ICD-10-CM | POA: Diagnosis not present

## 2021-02-20 DIAGNOSIS — F17200 Nicotine dependence, unspecified, uncomplicated: Secondary | ICD-10-CM | POA: Diagnosis not present

## 2021-02-20 DIAGNOSIS — R1310 Dysphagia, unspecified: Secondary | ICD-10-CM | POA: Diagnosis not present

## 2021-02-20 DIAGNOSIS — N1832 Chronic kidney disease, stage 3b: Secondary | ICD-10-CM | POA: Diagnosis not present

## 2021-02-20 SURGERY — ESOPHAGOGASTRODUODENOSCOPY (EGD) WITH PROPOFOL
Anesthesia: Monitor Anesthesia Care

## 2021-03-13 DIAGNOSIS — I10 Essential (primary) hypertension: Secondary | ICD-10-CM | POA: Diagnosis not present

## 2021-03-13 DIAGNOSIS — M159 Polyosteoarthritis, unspecified: Secondary | ICD-10-CM | POA: Diagnosis not present

## 2021-03-13 DIAGNOSIS — H9193 Unspecified hearing loss, bilateral: Secondary | ICD-10-CM | POA: Diagnosis not present

## 2021-03-13 DIAGNOSIS — M8080XA Other osteoporosis with current pathological fracture, unspecified site, initial encounter for fracture: Secondary | ICD-10-CM | POA: Diagnosis not present

## 2021-03-13 DIAGNOSIS — R0600 Dyspnea, unspecified: Secondary | ICD-10-CM | POA: Diagnosis not present

## 2021-03-13 DIAGNOSIS — F17200 Nicotine dependence, unspecified, uncomplicated: Secondary | ICD-10-CM | POA: Diagnosis not present

## 2021-03-13 DIAGNOSIS — F419 Anxiety disorder, unspecified: Secondary | ICD-10-CM | POA: Diagnosis not present

## 2021-03-13 DIAGNOSIS — R7989 Other specified abnormal findings of blood chemistry: Secondary | ICD-10-CM | POA: Diagnosis not present

## 2021-03-13 DIAGNOSIS — N1832 Chronic kidney disease, stage 3b: Secondary | ICD-10-CM | POA: Diagnosis not present

## 2021-03-13 DIAGNOSIS — E785 Hyperlipidemia, unspecified: Secondary | ICD-10-CM | POA: Diagnosis not present

## 2021-03-13 DIAGNOSIS — E876 Hypokalemia: Secondary | ICD-10-CM | POA: Diagnosis not present

## 2021-03-13 DIAGNOSIS — J449 Chronic obstructive pulmonary disease, unspecified: Secondary | ICD-10-CM | POA: Diagnosis not present

## 2021-03-14 DIAGNOSIS — M545 Low back pain, unspecified: Secondary | ICD-10-CM | POA: Diagnosis not present

## 2021-03-14 DIAGNOSIS — M1612 Unilateral primary osteoarthritis, left hip: Secondary | ICD-10-CM | POA: Diagnosis not present

## 2021-03-14 DIAGNOSIS — Z96642 Presence of left artificial hip joint: Secondary | ICD-10-CM | POA: Diagnosis not present

## 2021-03-14 DIAGNOSIS — M7632 Iliotibial band syndrome, left leg: Secondary | ICD-10-CM | POA: Diagnosis not present

## 2021-04-10 DIAGNOSIS — E876 Hypokalemia: Secondary | ICD-10-CM | POA: Diagnosis not present

## 2021-04-10 DIAGNOSIS — R7989 Other specified abnormal findings of blood chemistry: Secondary | ICD-10-CM | POA: Diagnosis not present

## 2021-04-10 DIAGNOSIS — R0600 Dyspnea, unspecified: Secondary | ICD-10-CM | POA: Diagnosis not present

## 2021-04-10 DIAGNOSIS — L0390 Cellulitis, unspecified: Secondary | ICD-10-CM | POA: Diagnosis not present

## 2021-04-10 DIAGNOSIS — R399 Unspecified symptoms and signs involving the genitourinary system: Secondary | ICD-10-CM | POA: Diagnosis not present

## 2021-04-10 DIAGNOSIS — I10 Essential (primary) hypertension: Secondary | ICD-10-CM | POA: Diagnosis not present

## 2021-04-10 DIAGNOSIS — H9193 Unspecified hearing loss, bilateral: Secondary | ICD-10-CM | POA: Diagnosis not present

## 2021-04-10 DIAGNOSIS — E785 Hyperlipidemia, unspecified: Secondary | ICD-10-CM | POA: Diagnosis not present

## 2021-04-10 DIAGNOSIS — M159 Polyosteoarthritis, unspecified: Secondary | ICD-10-CM | POA: Diagnosis not present

## 2021-04-10 DIAGNOSIS — J449 Chronic obstructive pulmonary disease, unspecified: Secondary | ICD-10-CM | POA: Diagnosis not present

## 2021-04-10 DIAGNOSIS — F419 Anxiety disorder, unspecified: Secondary | ICD-10-CM | POA: Diagnosis not present

## 2021-04-10 DIAGNOSIS — M8080XA Other osteoporosis with current pathological fracture, unspecified site, initial encounter for fracture: Secondary | ICD-10-CM | POA: Diagnosis not present

## 2021-04-13 DIAGNOSIS — Z20822 Contact with and (suspected) exposure to covid-19: Secondary | ICD-10-CM | POA: Diagnosis not present

## 2021-04-18 DIAGNOSIS — L0390 Cellulitis, unspecified: Secondary | ICD-10-CM | POA: Diagnosis not present

## 2021-04-18 DIAGNOSIS — N1832 Chronic kidney disease, stage 3b: Secondary | ICD-10-CM | POA: Diagnosis not present

## 2021-04-18 DIAGNOSIS — E876 Hypokalemia: Secondary | ICD-10-CM | POA: Diagnosis not present

## 2021-04-18 DIAGNOSIS — G629 Polyneuropathy, unspecified: Secondary | ICD-10-CM | POA: Diagnosis not present

## 2021-04-18 DIAGNOSIS — R7989 Other specified abnormal findings of blood chemistry: Secondary | ICD-10-CM | POA: Diagnosis not present

## 2021-04-18 DIAGNOSIS — H9193 Unspecified hearing loss, bilateral: Secondary | ICD-10-CM | POA: Diagnosis not present

## 2021-04-18 DIAGNOSIS — E785 Hyperlipidemia, unspecified: Secondary | ICD-10-CM | POA: Diagnosis not present

## 2021-04-18 DIAGNOSIS — R1310 Dysphagia, unspecified: Secondary | ICD-10-CM | POA: Diagnosis not present

## 2021-04-18 DIAGNOSIS — M8080XA Other osteoporosis with current pathological fracture, unspecified site, initial encounter for fracture: Secondary | ICD-10-CM | POA: Diagnosis not present

## 2021-04-18 DIAGNOSIS — F17200 Nicotine dependence, unspecified, uncomplicated: Secondary | ICD-10-CM | POA: Diagnosis not present

## 2021-04-18 DIAGNOSIS — J449 Chronic obstructive pulmonary disease, unspecified: Secondary | ICD-10-CM | POA: Diagnosis not present

## 2021-04-18 DIAGNOSIS — F3341 Major depressive disorder, recurrent, in partial remission: Secondary | ICD-10-CM | POA: Diagnosis not present

## 2021-04-25 DIAGNOSIS — L0390 Cellulitis, unspecified: Secondary | ICD-10-CM | POA: Diagnosis not present

## 2021-04-25 DIAGNOSIS — M8080XA Other osteoporosis with current pathological fracture, unspecified site, initial encounter for fracture: Secondary | ICD-10-CM | POA: Diagnosis not present

## 2021-04-25 DIAGNOSIS — F17200 Nicotine dependence, unspecified, uncomplicated: Secondary | ICD-10-CM | POA: Diagnosis not present

## 2021-04-25 DIAGNOSIS — J449 Chronic obstructive pulmonary disease, unspecified: Secondary | ICD-10-CM | POA: Diagnosis not present

## 2021-04-25 DIAGNOSIS — E876 Hypokalemia: Secondary | ICD-10-CM | POA: Diagnosis not present

## 2021-04-25 DIAGNOSIS — E785 Hyperlipidemia, unspecified: Secondary | ICD-10-CM | POA: Diagnosis not present

## 2021-04-25 DIAGNOSIS — G629 Polyneuropathy, unspecified: Secondary | ICD-10-CM | POA: Diagnosis not present

## 2021-04-25 DIAGNOSIS — R1310 Dysphagia, unspecified: Secondary | ICD-10-CM | POA: Diagnosis not present

## 2021-04-25 DIAGNOSIS — R7989 Other specified abnormal findings of blood chemistry: Secondary | ICD-10-CM | POA: Diagnosis not present

## 2021-04-25 DIAGNOSIS — F3341 Major depressive disorder, recurrent, in partial remission: Secondary | ICD-10-CM | POA: Diagnosis not present

## 2021-04-25 DIAGNOSIS — H9193 Unspecified hearing loss, bilateral: Secondary | ICD-10-CM | POA: Diagnosis not present

## 2021-04-25 DIAGNOSIS — N1832 Chronic kidney disease, stage 3b: Secondary | ICD-10-CM | POA: Diagnosis not present

## 2021-05-09 DIAGNOSIS — Z20822 Contact with and (suspected) exposure to covid-19: Secondary | ICD-10-CM | POA: Diagnosis not present

## 2021-05-09 DIAGNOSIS — J069 Acute upper respiratory infection, unspecified: Secondary | ICD-10-CM | POA: Diagnosis not present

## 2021-05-09 DIAGNOSIS — J449 Chronic obstructive pulmonary disease, unspecified: Secondary | ICD-10-CM | POA: Diagnosis not present

## 2021-05-09 DIAGNOSIS — Z6823 Body mass index (BMI) 23.0-23.9, adult: Secondary | ICD-10-CM | POA: Diagnosis not present

## 2021-05-09 DIAGNOSIS — F419 Anxiety disorder, unspecified: Secondary | ICD-10-CM | POA: Diagnosis not present

## 2021-05-22 DIAGNOSIS — J449 Chronic obstructive pulmonary disease, unspecified: Secondary | ICD-10-CM | POA: Diagnosis not present

## 2021-05-22 DIAGNOSIS — L0390 Cellulitis, unspecified: Secondary | ICD-10-CM | POA: Diagnosis not present

## 2021-05-22 DIAGNOSIS — F419 Anxiety disorder, unspecified: Secondary | ICD-10-CM | POA: Diagnosis not present

## 2021-05-22 DIAGNOSIS — Z6823 Body mass index (BMI) 23.0-23.9, adult: Secondary | ICD-10-CM | POA: Diagnosis not present

## 2021-05-29 DIAGNOSIS — R0600 Dyspnea, unspecified: Secondary | ICD-10-CM | POA: Diagnosis not present

## 2021-05-29 DIAGNOSIS — H9193 Unspecified hearing loss, bilateral: Secondary | ICD-10-CM | POA: Diagnosis not present

## 2021-05-29 DIAGNOSIS — E785 Hyperlipidemia, unspecified: Secondary | ICD-10-CM | POA: Diagnosis not present

## 2021-05-29 DIAGNOSIS — F17200 Nicotine dependence, unspecified, uncomplicated: Secondary | ICD-10-CM | POA: Diagnosis not present

## 2021-05-29 DIAGNOSIS — J449 Chronic obstructive pulmonary disease, unspecified: Secondary | ICD-10-CM | POA: Diagnosis not present

## 2021-05-29 DIAGNOSIS — E876 Hypokalemia: Secondary | ICD-10-CM | POA: Diagnosis not present

## 2021-05-29 DIAGNOSIS — R7989 Other specified abnormal findings of blood chemistry: Secondary | ICD-10-CM | POA: Diagnosis not present

## 2021-05-29 DIAGNOSIS — F419 Anxiety disorder, unspecified: Secondary | ICD-10-CM | POA: Diagnosis not present

## 2021-05-29 DIAGNOSIS — N1832 Chronic kidney disease, stage 3b: Secondary | ICD-10-CM | POA: Diagnosis not present

## 2021-05-29 DIAGNOSIS — R1310 Dysphagia, unspecified: Secondary | ICD-10-CM | POA: Diagnosis not present

## 2021-05-29 DIAGNOSIS — I10 Essential (primary) hypertension: Secondary | ICD-10-CM | POA: Diagnosis not present

## 2021-05-29 DIAGNOSIS — M159 Polyosteoarthritis, unspecified: Secondary | ICD-10-CM | POA: Diagnosis not present

## 2021-06-13 ENCOUNTER — Other Ambulatory Visit: Payer: Self-pay

## 2021-06-14 ENCOUNTER — Ambulatory Visit: Payer: Medicare Other | Admitting: Cardiology

## 2021-06-26 DIAGNOSIS — J449 Chronic obstructive pulmonary disease, unspecified: Secondary | ICD-10-CM | POA: Diagnosis not present

## 2021-06-26 DIAGNOSIS — E876 Hypokalemia: Secondary | ICD-10-CM | POA: Diagnosis not present

## 2021-06-26 DIAGNOSIS — M8080XA Other osteoporosis with current pathological fracture, unspecified site, initial encounter for fracture: Secondary | ICD-10-CM | POA: Diagnosis not present

## 2021-06-26 DIAGNOSIS — F419 Anxiety disorder, unspecified: Secondary | ICD-10-CM | POA: Diagnosis not present

## 2021-06-26 DIAGNOSIS — R7989 Other specified abnormal findings of blood chemistry: Secondary | ICD-10-CM | POA: Diagnosis not present

## 2021-06-26 DIAGNOSIS — R0600 Dyspnea, unspecified: Secondary | ICD-10-CM | POA: Diagnosis not present

## 2021-06-26 DIAGNOSIS — M159 Polyosteoarthritis, unspecified: Secondary | ICD-10-CM | POA: Diagnosis not present

## 2021-06-26 DIAGNOSIS — I10 Essential (primary) hypertension: Secondary | ICD-10-CM | POA: Diagnosis not present

## 2021-06-26 DIAGNOSIS — H9193 Unspecified hearing loss, bilateral: Secondary | ICD-10-CM | POA: Diagnosis not present

## 2021-06-26 DIAGNOSIS — E785 Hyperlipidemia, unspecified: Secondary | ICD-10-CM | POA: Diagnosis not present

## 2021-06-26 DIAGNOSIS — Z6824 Body mass index (BMI) 24.0-24.9, adult: Secondary | ICD-10-CM | POA: Diagnosis not present

## 2021-06-26 DIAGNOSIS — N390 Urinary tract infection, site not specified: Secondary | ICD-10-CM | POA: Diagnosis not present

## 2021-07-24 DIAGNOSIS — M159 Polyosteoarthritis, unspecified: Secondary | ICD-10-CM | POA: Diagnosis not present

## 2021-07-24 DIAGNOSIS — M8080XA Other osteoporosis with current pathological fracture, unspecified site, initial encounter for fracture: Secondary | ICD-10-CM | POA: Diagnosis not present

## 2021-07-24 DIAGNOSIS — H9193 Unspecified hearing loss, bilateral: Secondary | ICD-10-CM | POA: Diagnosis not present

## 2021-07-24 DIAGNOSIS — J449 Chronic obstructive pulmonary disease, unspecified: Secondary | ICD-10-CM | POA: Diagnosis not present

## 2021-07-24 DIAGNOSIS — F17200 Nicotine dependence, unspecified, uncomplicated: Secondary | ICD-10-CM | POA: Diagnosis not present

## 2021-07-24 DIAGNOSIS — E785 Hyperlipidemia, unspecified: Secondary | ICD-10-CM | POA: Diagnosis not present

## 2021-07-24 DIAGNOSIS — R7989 Other specified abnormal findings of blood chemistry: Secondary | ICD-10-CM | POA: Diagnosis not present

## 2021-07-24 DIAGNOSIS — R0600 Dyspnea, unspecified: Secondary | ICD-10-CM | POA: Diagnosis not present

## 2021-07-24 DIAGNOSIS — F419 Anxiety disorder, unspecified: Secondary | ICD-10-CM | POA: Diagnosis not present

## 2021-07-24 DIAGNOSIS — E876 Hypokalemia: Secondary | ICD-10-CM | POA: Diagnosis not present

## 2021-07-24 DIAGNOSIS — Z23 Encounter for immunization: Secondary | ICD-10-CM | POA: Diagnosis not present

## 2021-07-24 DIAGNOSIS — I10 Essential (primary) hypertension: Secondary | ICD-10-CM | POA: Diagnosis not present

## 2021-07-30 DIAGNOSIS — Z6823 Body mass index (BMI) 23.0-23.9, adult: Secondary | ICD-10-CM | POA: Diagnosis not present

## 2021-07-30 DIAGNOSIS — J449 Chronic obstructive pulmonary disease, unspecified: Secondary | ICD-10-CM | POA: Diagnosis not present

## 2021-07-30 DIAGNOSIS — U071 COVID-19: Secondary | ICD-10-CM | POA: Diagnosis not present

## 2021-07-30 DIAGNOSIS — I10 Essential (primary) hypertension: Secondary | ICD-10-CM | POA: Diagnosis not present

## 2021-08-06 DIAGNOSIS — U071 COVID-19: Secondary | ICD-10-CM | POA: Diagnosis not present

## 2021-08-06 DIAGNOSIS — J399 Disease of upper respiratory tract, unspecified: Secondary | ICD-10-CM | POA: Diagnosis not present

## 2021-08-06 DIAGNOSIS — J449 Chronic obstructive pulmonary disease, unspecified: Secondary | ICD-10-CM | POA: Diagnosis not present

## 2021-08-06 DIAGNOSIS — Z6823 Body mass index (BMI) 23.0-23.9, adult: Secondary | ICD-10-CM | POA: Diagnosis not present

## 2021-08-06 DIAGNOSIS — I10 Essential (primary) hypertension: Secondary | ICD-10-CM | POA: Diagnosis not present

## 2021-08-13 DIAGNOSIS — Z6823 Body mass index (BMI) 23.0-23.9, adult: Secondary | ICD-10-CM | POA: Diagnosis not present

## 2021-08-13 DIAGNOSIS — J399 Disease of upper respiratory tract, unspecified: Secondary | ICD-10-CM | POA: Diagnosis not present

## 2021-08-13 DIAGNOSIS — I10 Essential (primary) hypertension: Secondary | ICD-10-CM | POA: Diagnosis not present

## 2021-08-13 DIAGNOSIS — J449 Chronic obstructive pulmonary disease, unspecified: Secondary | ICD-10-CM | POA: Diagnosis not present

## 2021-08-14 DIAGNOSIS — E785 Hyperlipidemia, unspecified: Secondary | ICD-10-CM | POA: Diagnosis not present

## 2021-08-14 DIAGNOSIS — Z1331 Encounter for screening for depression: Secondary | ICD-10-CM | POA: Diagnosis not present

## 2021-08-14 DIAGNOSIS — Z9181 History of falling: Secondary | ICD-10-CM | POA: Diagnosis not present

## 2021-08-14 DIAGNOSIS — Z0000 Encounter for general adult medical examination without abnormal findings: Secondary | ICD-10-CM | POA: Diagnosis not present

## 2021-08-14 DIAGNOSIS — Z139 Encounter for screening, unspecified: Secondary | ICD-10-CM | POA: Diagnosis not present

## 2021-08-16 DIAGNOSIS — M159 Polyosteoarthritis, unspecified: Secondary | ICD-10-CM | POA: Diagnosis not present

## 2021-08-16 DIAGNOSIS — N1832 Chronic kidney disease, stage 3b: Secondary | ICD-10-CM | POA: Diagnosis not present

## 2021-08-16 DIAGNOSIS — H9193 Unspecified hearing loss, bilateral: Secondary | ICD-10-CM | POA: Diagnosis not present

## 2021-08-16 DIAGNOSIS — R42 Dizziness and giddiness: Secondary | ICD-10-CM | POA: Diagnosis not present

## 2021-08-16 DIAGNOSIS — M8080XA Other osteoporosis with current pathological fracture, unspecified site, initial encounter for fracture: Secondary | ICD-10-CM | POA: Diagnosis not present

## 2021-08-16 DIAGNOSIS — R1310 Dysphagia, unspecified: Secondary | ICD-10-CM | POA: Diagnosis not present

## 2021-08-16 DIAGNOSIS — E876 Hypokalemia: Secondary | ICD-10-CM | POA: Diagnosis not present

## 2021-08-16 DIAGNOSIS — F17200 Nicotine dependence, unspecified, uncomplicated: Secondary | ICD-10-CM | POA: Diagnosis not present

## 2021-08-16 DIAGNOSIS — R0600 Dyspnea, unspecified: Secondary | ICD-10-CM | POA: Diagnosis not present

## 2021-08-16 DIAGNOSIS — E785 Hyperlipidemia, unspecified: Secondary | ICD-10-CM | POA: Diagnosis not present

## 2021-08-16 DIAGNOSIS — F419 Anxiety disorder, unspecified: Secondary | ICD-10-CM | POA: Diagnosis not present

## 2021-08-16 DIAGNOSIS — R7989 Other specified abnormal findings of blood chemistry: Secondary | ICD-10-CM | POA: Diagnosis not present

## 2021-09-20 DIAGNOSIS — H9193 Unspecified hearing loss, bilateral: Secondary | ICD-10-CM | POA: Diagnosis not present

## 2021-09-20 DIAGNOSIS — E785 Hyperlipidemia, unspecified: Secondary | ICD-10-CM | POA: Diagnosis not present

## 2021-09-20 DIAGNOSIS — F17200 Nicotine dependence, unspecified, uncomplicated: Secondary | ICD-10-CM | POA: Diagnosis not present

## 2021-09-20 DIAGNOSIS — Z6824 Body mass index (BMI) 24.0-24.9, adult: Secondary | ICD-10-CM | POA: Diagnosis not present

## 2021-09-20 DIAGNOSIS — N1832 Chronic kidney disease, stage 3b: Secondary | ICD-10-CM | POA: Diagnosis not present

## 2021-09-20 DIAGNOSIS — M159 Polyosteoarthritis, unspecified: Secondary | ICD-10-CM | POA: Diagnosis not present

## 2021-09-20 DIAGNOSIS — F419 Anxiety disorder, unspecified: Secondary | ICD-10-CM | POA: Diagnosis not present

## 2021-09-20 DIAGNOSIS — R42 Dizziness and giddiness: Secondary | ICD-10-CM | POA: Diagnosis not present

## 2021-09-20 DIAGNOSIS — R7989 Other specified abnormal findings of blood chemistry: Secondary | ICD-10-CM | POA: Diagnosis not present

## 2021-09-20 DIAGNOSIS — M8080XA Other osteoporosis with current pathological fracture, unspecified site, initial encounter for fracture: Secondary | ICD-10-CM | POA: Diagnosis not present

## 2021-09-20 DIAGNOSIS — E876 Hypokalemia: Secondary | ICD-10-CM | POA: Diagnosis not present

## 2021-09-20 DIAGNOSIS — R1310 Dysphagia, unspecified: Secondary | ICD-10-CM | POA: Diagnosis not present

## 2021-10-03 DIAGNOSIS — M7062 Trochanteric bursitis, left hip: Secondary | ICD-10-CM | POA: Diagnosis not present

## 2021-10-18 DIAGNOSIS — R42 Dizziness and giddiness: Secondary | ICD-10-CM | POA: Diagnosis not present

## 2021-10-18 DIAGNOSIS — R7989 Other specified abnormal findings of blood chemistry: Secondary | ICD-10-CM | POA: Diagnosis not present

## 2021-10-18 DIAGNOSIS — F419 Anxiety disorder, unspecified: Secondary | ICD-10-CM | POA: Diagnosis not present

## 2021-10-18 DIAGNOSIS — M159 Polyosteoarthritis, unspecified: Secondary | ICD-10-CM | POA: Diagnosis not present

## 2021-10-18 DIAGNOSIS — R1310 Dysphagia, unspecified: Secondary | ICD-10-CM | POA: Diagnosis not present

## 2021-10-18 DIAGNOSIS — N1832 Chronic kidney disease, stage 3b: Secondary | ICD-10-CM | POA: Diagnosis not present

## 2021-10-18 DIAGNOSIS — M8080XA Other osteoporosis with current pathological fracture, unspecified site, initial encounter for fracture: Secondary | ICD-10-CM | POA: Diagnosis not present

## 2021-10-18 DIAGNOSIS — H9193 Unspecified hearing loss, bilateral: Secondary | ICD-10-CM | POA: Diagnosis not present

## 2021-10-18 DIAGNOSIS — J069 Acute upper respiratory infection, unspecified: Secondary | ICD-10-CM | POA: Diagnosis not present

## 2021-10-18 DIAGNOSIS — F17200 Nicotine dependence, unspecified, uncomplicated: Secondary | ICD-10-CM | POA: Diagnosis not present

## 2021-10-18 DIAGNOSIS — E785 Hyperlipidemia, unspecified: Secondary | ICD-10-CM | POA: Diagnosis not present

## 2021-10-18 DIAGNOSIS — E876 Hypokalemia: Secondary | ICD-10-CM | POA: Diagnosis not present

## 2021-11-13 DIAGNOSIS — J449 Chronic obstructive pulmonary disease, unspecified: Secondary | ICD-10-CM | POA: Diagnosis not present

## 2021-11-13 DIAGNOSIS — N1832 Chronic kidney disease, stage 3b: Secondary | ICD-10-CM | POA: Diagnosis not present

## 2021-11-29 DIAGNOSIS — S81812A Laceration without foreign body, left lower leg, initial encounter: Secondary | ICD-10-CM | POA: Diagnosis not present

## 2021-12-13 DIAGNOSIS — R42 Dizziness and giddiness: Secondary | ICD-10-CM | POA: Diagnosis not present

## 2021-12-13 DIAGNOSIS — H9193 Unspecified hearing loss, bilateral: Secondary | ICD-10-CM | POA: Diagnosis not present

## 2021-12-13 DIAGNOSIS — N1832 Chronic kidney disease, stage 3b: Secondary | ICD-10-CM | POA: Diagnosis not present

## 2021-12-13 DIAGNOSIS — L03116 Cellulitis of left lower limb: Secondary | ICD-10-CM | POA: Diagnosis not present

## 2021-12-13 DIAGNOSIS — F419 Anxiety disorder, unspecified: Secondary | ICD-10-CM | POA: Diagnosis not present

## 2021-12-13 DIAGNOSIS — M159 Polyosteoarthritis, unspecified: Secondary | ICD-10-CM | POA: Diagnosis not present

## 2021-12-13 DIAGNOSIS — R7989 Other specified abnormal findings of blood chemistry: Secondary | ICD-10-CM | POA: Diagnosis not present

## 2021-12-13 DIAGNOSIS — E785 Hyperlipidemia, unspecified: Secondary | ICD-10-CM | POA: Diagnosis not present

## 2021-12-13 DIAGNOSIS — F17200 Nicotine dependence, unspecified, uncomplicated: Secondary | ICD-10-CM | POA: Diagnosis not present

## 2021-12-13 DIAGNOSIS — M8080XA Other osteoporosis with current pathological fracture, unspecified site, initial encounter for fracture: Secondary | ICD-10-CM | POA: Diagnosis not present

## 2021-12-13 DIAGNOSIS — R1310 Dysphagia, unspecified: Secondary | ICD-10-CM | POA: Diagnosis not present

## 2021-12-13 DIAGNOSIS — E876 Hypokalemia: Secondary | ICD-10-CM | POA: Diagnosis not present

## 2021-12-19 DIAGNOSIS — E876 Hypokalemia: Secondary | ICD-10-CM | POA: Diagnosis not present

## 2021-12-19 DIAGNOSIS — L03116 Cellulitis of left lower limb: Secondary | ICD-10-CM | POA: Diagnosis not present

## 2021-12-19 DIAGNOSIS — R42 Dizziness and giddiness: Secondary | ICD-10-CM | POA: Diagnosis not present

## 2021-12-19 DIAGNOSIS — B3731 Acute candidiasis of vulva and vagina: Secondary | ICD-10-CM | POA: Diagnosis not present

## 2021-12-19 DIAGNOSIS — N1832 Chronic kidney disease, stage 3b: Secondary | ICD-10-CM | POA: Diagnosis not present

## 2021-12-19 DIAGNOSIS — M8080XA Other osteoporosis with current pathological fracture, unspecified site, initial encounter for fracture: Secondary | ICD-10-CM | POA: Diagnosis not present

## 2021-12-19 DIAGNOSIS — F17200 Nicotine dependence, unspecified, uncomplicated: Secondary | ICD-10-CM | POA: Diagnosis not present

## 2021-12-19 DIAGNOSIS — E785 Hyperlipidemia, unspecified: Secondary | ICD-10-CM | POA: Diagnosis not present

## 2021-12-19 DIAGNOSIS — H9193 Unspecified hearing loss, bilateral: Secondary | ICD-10-CM | POA: Diagnosis not present

## 2021-12-19 DIAGNOSIS — F419 Anxiety disorder, unspecified: Secondary | ICD-10-CM | POA: Diagnosis not present

## 2021-12-19 DIAGNOSIS — R1310 Dysphagia, unspecified: Secondary | ICD-10-CM | POA: Diagnosis not present

## 2021-12-19 DIAGNOSIS — R7989 Other specified abnormal findings of blood chemistry: Secondary | ICD-10-CM | POA: Diagnosis not present

## 2022-01-11 DIAGNOSIS — J449 Chronic obstructive pulmonary disease, unspecified: Secondary | ICD-10-CM | POA: Diagnosis not present

## 2022-01-11 DIAGNOSIS — I10 Essential (primary) hypertension: Secondary | ICD-10-CM | POA: Diagnosis not present

## 2022-01-11 DIAGNOSIS — N1832 Chronic kidney disease, stage 3b: Secondary | ICD-10-CM | POA: Diagnosis not present

## 2022-01-29 DIAGNOSIS — Z20822 Contact with and (suspected) exposure to covid-19: Secondary | ICD-10-CM | POA: Diagnosis not present

## 2022-02-22 DIAGNOSIS — Z6824 Body mass index (BMI) 24.0-24.9, adult: Secondary | ICD-10-CM | POA: Diagnosis not present

## 2022-02-22 DIAGNOSIS — I10 Essential (primary) hypertension: Secondary | ICD-10-CM | POA: Diagnosis not present

## 2022-02-22 DIAGNOSIS — J449 Chronic obstructive pulmonary disease, unspecified: Secondary | ICD-10-CM | POA: Diagnosis not present

## 2022-02-22 DIAGNOSIS — M159 Polyosteoarthritis, unspecified: Secondary | ICD-10-CM | POA: Diagnosis not present

## 2022-02-22 DIAGNOSIS — U071 COVID-19: Secondary | ICD-10-CM | POA: Diagnosis not present

## 2022-03-01 DIAGNOSIS — J449 Chronic obstructive pulmonary disease, unspecified: Secondary | ICD-10-CM | POA: Diagnosis not present

## 2022-03-01 DIAGNOSIS — I10 Essential (primary) hypertension: Secondary | ICD-10-CM | POA: Diagnosis not present

## 2022-03-01 DIAGNOSIS — M159 Polyosteoarthritis, unspecified: Secondary | ICD-10-CM | POA: Diagnosis not present

## 2022-03-01 DIAGNOSIS — Z6824 Body mass index (BMI) 24.0-24.9, adult: Secondary | ICD-10-CM | POA: Diagnosis not present

## 2022-03-01 DIAGNOSIS — J069 Acute upper respiratory infection, unspecified: Secondary | ICD-10-CM | POA: Diagnosis not present

## 2022-03-05 DIAGNOSIS — M8080XA Other osteoporosis with current pathological fracture, unspecified site, initial encounter for fracture: Secondary | ICD-10-CM | POA: Diagnosis not present

## 2022-03-05 DIAGNOSIS — Z6823 Body mass index (BMI) 23.0-23.9, adult: Secondary | ICD-10-CM | POA: Diagnosis not present

## 2022-03-05 DIAGNOSIS — H9193 Unspecified hearing loss, bilateral: Secondary | ICD-10-CM | POA: Diagnosis not present

## 2022-03-05 DIAGNOSIS — F419 Anxiety disorder, unspecified: Secondary | ICD-10-CM | POA: Diagnosis not present

## 2022-03-05 DIAGNOSIS — R42 Dizziness and giddiness: Secondary | ICD-10-CM | POA: Diagnosis not present

## 2022-03-05 DIAGNOSIS — E876 Hypokalemia: Secondary | ICD-10-CM | POA: Diagnosis not present

## 2022-03-05 DIAGNOSIS — E785 Hyperlipidemia, unspecified: Secondary | ICD-10-CM | POA: Diagnosis not present

## 2022-03-05 DIAGNOSIS — R7989 Other specified abnormal findings of blood chemistry: Secondary | ICD-10-CM | POA: Diagnosis not present

## 2022-03-05 DIAGNOSIS — I10 Essential (primary) hypertension: Secondary | ICD-10-CM | POA: Diagnosis not present

## 2022-03-05 DIAGNOSIS — M159 Polyosteoarthritis, unspecified: Secondary | ICD-10-CM | POA: Diagnosis not present

## 2022-03-06 DIAGNOSIS — L400 Psoriasis vulgaris: Secondary | ICD-10-CM | POA: Diagnosis not present

## 2022-03-06 DIAGNOSIS — L299 Pruritus, unspecified: Secondary | ICD-10-CM | POA: Diagnosis not present

## 2022-03-06 DIAGNOSIS — H61001 Unspecified perichondritis of right external ear: Secondary | ICD-10-CM | POA: Diagnosis not present

## 2022-03-06 DIAGNOSIS — D485 Neoplasm of uncertain behavior of skin: Secondary | ICD-10-CM | POA: Diagnosis not present

## 2022-03-19 DIAGNOSIS — M19012 Primary osteoarthritis, left shoulder: Secondary | ICD-10-CM | POA: Diagnosis not present

## 2022-03-19 DIAGNOSIS — M1612 Unilateral primary osteoarthritis, left hip: Secondary | ICD-10-CM | POA: Diagnosis not present

## 2022-03-20 DIAGNOSIS — I10 Essential (primary) hypertension: Secondary | ICD-10-CM | POA: Diagnosis not present

## 2022-03-20 DIAGNOSIS — E785 Hyperlipidemia, unspecified: Secondary | ICD-10-CM | POA: Diagnosis not present

## 2022-03-20 DIAGNOSIS — F17200 Nicotine dependence, unspecified, uncomplicated: Secondary | ICD-10-CM | POA: Diagnosis not present

## 2022-03-20 DIAGNOSIS — M159 Polyosteoarthritis, unspecified: Secondary | ICD-10-CM | POA: Diagnosis not present

## 2022-03-20 DIAGNOSIS — N1832 Chronic kidney disease, stage 3b: Secondary | ICD-10-CM | POA: Diagnosis not present

## 2022-03-20 DIAGNOSIS — R7989 Other specified abnormal findings of blood chemistry: Secondary | ICD-10-CM | POA: Diagnosis not present

## 2022-03-20 DIAGNOSIS — M8080XA Other osteoporosis with current pathological fracture, unspecified site, initial encounter for fracture: Secondary | ICD-10-CM | POA: Diagnosis not present

## 2022-03-20 DIAGNOSIS — E876 Hypokalemia: Secondary | ICD-10-CM | POA: Diagnosis not present

## 2022-03-20 DIAGNOSIS — F419 Anxiety disorder, unspecified: Secondary | ICD-10-CM | POA: Diagnosis not present

## 2022-03-20 DIAGNOSIS — H9193 Unspecified hearing loss, bilateral: Secondary | ICD-10-CM | POA: Diagnosis not present

## 2022-03-20 DIAGNOSIS — R42 Dizziness and giddiness: Secondary | ICD-10-CM | POA: Diagnosis not present

## 2022-03-20 DIAGNOSIS — J208 Acute bronchitis due to other specified organisms: Secondary | ICD-10-CM | POA: Diagnosis not present

## 2022-03-25 DIAGNOSIS — D485 Neoplasm of uncertain behavior of skin: Secondary | ICD-10-CM | POA: Diagnosis not present

## 2022-04-03 DIAGNOSIS — R42 Dizziness and giddiness: Secondary | ICD-10-CM | POA: Diagnosis not present

## 2022-04-03 DIAGNOSIS — B9689 Other specified bacterial agents as the cause of diseases classified elsewhere: Secondary | ICD-10-CM | POA: Diagnosis not present

## 2022-04-03 DIAGNOSIS — E785 Hyperlipidemia, unspecified: Secondary | ICD-10-CM | POA: Diagnosis not present

## 2022-04-03 DIAGNOSIS — H9193 Unspecified hearing loss, bilateral: Secondary | ICD-10-CM | POA: Diagnosis not present

## 2022-04-03 DIAGNOSIS — M159 Polyosteoarthritis, unspecified: Secondary | ICD-10-CM | POA: Diagnosis not present

## 2022-04-03 DIAGNOSIS — I10 Essential (primary) hypertension: Secondary | ICD-10-CM | POA: Diagnosis not present

## 2022-04-03 DIAGNOSIS — F419 Anxiety disorder, unspecified: Secondary | ICD-10-CM | POA: Diagnosis not present

## 2022-04-03 DIAGNOSIS — J208 Acute bronchitis due to other specified organisms: Secondary | ICD-10-CM | POA: Diagnosis not present

## 2022-04-03 DIAGNOSIS — K219 Gastro-esophageal reflux disease without esophagitis: Secondary | ICD-10-CM | POA: Diagnosis not present

## 2022-04-03 DIAGNOSIS — R7989 Other specified abnormal findings of blood chemistry: Secondary | ICD-10-CM | POA: Diagnosis not present

## 2022-04-03 DIAGNOSIS — E876 Hypokalemia: Secondary | ICD-10-CM | POA: Diagnosis not present

## 2022-04-03 DIAGNOSIS — M8080XA Other osteoporosis with current pathological fracture, unspecified site, initial encounter for fracture: Secondary | ICD-10-CM | POA: Diagnosis not present

## 2022-04-11 DIAGNOSIS — I10 Essential (primary) hypertension: Secondary | ICD-10-CM | POA: Diagnosis not present

## 2022-04-11 DIAGNOSIS — H9193 Unspecified hearing loss, bilateral: Secondary | ICD-10-CM | POA: Diagnosis not present

## 2022-04-11 DIAGNOSIS — M8080XA Other osteoporosis with current pathological fracture, unspecified site, initial encounter for fracture: Secondary | ICD-10-CM | POA: Diagnosis not present

## 2022-04-11 DIAGNOSIS — E785 Hyperlipidemia, unspecified: Secondary | ICD-10-CM | POA: Diagnosis not present

## 2022-04-11 DIAGNOSIS — R7989 Other specified abnormal findings of blood chemistry: Secondary | ICD-10-CM | POA: Diagnosis not present

## 2022-04-11 DIAGNOSIS — F419 Anxiety disorder, unspecified: Secondary | ICD-10-CM | POA: Diagnosis not present

## 2022-04-11 DIAGNOSIS — R42 Dizziness and giddiness: Secondary | ICD-10-CM | POA: Diagnosis not present

## 2022-04-11 DIAGNOSIS — K219 Gastro-esophageal reflux disease without esophagitis: Secondary | ICD-10-CM | POA: Diagnosis not present

## 2022-04-11 DIAGNOSIS — N1832 Chronic kidney disease, stage 3b: Secondary | ICD-10-CM | POA: Diagnosis not present

## 2022-04-11 DIAGNOSIS — M159 Polyosteoarthritis, unspecified: Secondary | ICD-10-CM | POA: Diagnosis not present

## 2022-04-11 DIAGNOSIS — F17200 Nicotine dependence, unspecified, uncomplicated: Secondary | ICD-10-CM | POA: Diagnosis not present

## 2022-04-11 DIAGNOSIS — E876 Hypokalemia: Secondary | ICD-10-CM | POA: Diagnosis not present

## 2022-04-26 DIAGNOSIS — I129 Hypertensive chronic kidney disease with stage 1 through stage 4 chronic kidney disease, or unspecified chronic kidney disease: Secondary | ICD-10-CM | POA: Diagnosis not present

## 2022-04-26 DIAGNOSIS — R6521 Severe sepsis with septic shock: Secondary | ICD-10-CM | POA: Diagnosis not present

## 2022-04-26 DIAGNOSIS — R600 Localized edema: Secondary | ICD-10-CM | POA: Diagnosis not present

## 2022-04-26 DIAGNOSIS — R0689 Other abnormalities of breathing: Secondary | ICD-10-CM | POA: Diagnosis not present

## 2022-04-26 DIAGNOSIS — R609 Edema, unspecified: Secondary | ICD-10-CM | POA: Diagnosis not present

## 2022-04-26 DIAGNOSIS — A419 Sepsis, unspecified organism: Secondary | ICD-10-CM | POA: Diagnosis not present

## 2022-04-26 DIAGNOSIS — I959 Hypotension, unspecified: Secondary | ICD-10-CM | POA: Diagnosis not present

## 2022-04-26 DIAGNOSIS — R230 Cyanosis: Secondary | ICD-10-CM | POA: Diagnosis not present

## 2022-04-26 DIAGNOSIS — M7989 Other specified soft tissue disorders: Secondary | ICD-10-CM | POA: Diagnosis not present

## 2022-04-26 DIAGNOSIS — R110 Nausea: Secondary | ICD-10-CM | POA: Diagnosis not present

## 2022-04-26 DIAGNOSIS — N189 Chronic kidney disease, unspecified: Secondary | ICD-10-CM | POA: Diagnosis not present

## 2022-04-27 DIAGNOSIS — Z9882 Breast implant status: Secondary | ICD-10-CM | POA: Diagnosis not present

## 2022-04-27 DIAGNOSIS — I129 Hypertensive chronic kidney disease with stage 1 through stage 4 chronic kidney disease, or unspecified chronic kidney disease: Secondary | ICD-10-CM | POA: Diagnosis not present

## 2022-04-27 DIAGNOSIS — K219 Gastro-esophageal reflux disease without esophagitis: Secondary | ICD-10-CM | POA: Diagnosis not present

## 2022-04-27 DIAGNOSIS — R921 Mammographic calcification found on diagnostic imaging of breast: Secondary | ICD-10-CM | POA: Diagnosis not present

## 2022-04-27 DIAGNOSIS — E872 Acidosis, unspecified: Secondary | ICD-10-CM | POA: Diagnosis not present

## 2022-04-27 DIAGNOSIS — I130 Hypertensive heart and chronic kidney disease with heart failure and stage 1 through stage 4 chronic kidney disease, or unspecified chronic kidney disease: Secondary | ICD-10-CM | POA: Diagnosis not present

## 2022-04-27 DIAGNOSIS — R41841 Cognitive communication deficit: Secondary | ICD-10-CM | POA: Diagnosis not present

## 2022-04-27 DIAGNOSIS — I959 Hypotension, unspecified: Secondary | ICD-10-CM | POA: Diagnosis not present

## 2022-04-27 DIAGNOSIS — E876 Hypokalemia: Secondary | ICD-10-CM | POA: Diagnosis not present

## 2022-04-27 DIAGNOSIS — Z9012 Acquired absence of left breast and nipple: Secondary | ICD-10-CM | POA: Diagnosis not present

## 2022-04-27 DIAGNOSIS — Z885 Allergy status to narcotic agent status: Secondary | ICD-10-CM | POA: Diagnosis not present

## 2022-04-27 DIAGNOSIS — Z452 Encounter for adjustment and management of vascular access device: Secondary | ICD-10-CM | POA: Diagnosis not present

## 2022-04-27 DIAGNOSIS — A419 Sepsis, unspecified organism: Secondary | ICD-10-CM | POA: Diagnosis not present

## 2022-04-27 DIAGNOSIS — Z7401 Bed confinement status: Secondary | ICD-10-CM | POA: Diagnosis not present

## 2022-04-27 DIAGNOSIS — Z792 Long term (current) use of antibiotics: Secondary | ICD-10-CM | POA: Diagnosis not present

## 2022-04-27 DIAGNOSIS — J449 Chronic obstructive pulmonary disease, unspecified: Secondary | ICD-10-CM | POA: Diagnosis not present

## 2022-04-27 DIAGNOSIS — Z881 Allergy status to other antibiotic agents status: Secondary | ICD-10-CM | POA: Diagnosis not present

## 2022-04-27 DIAGNOSIS — N1832 Chronic kidney disease, stage 3b: Secondary | ICD-10-CM | POA: Diagnosis not present

## 2022-04-27 DIAGNOSIS — F17210 Nicotine dependence, cigarettes, uncomplicated: Secondary | ICD-10-CM | POA: Diagnosis not present

## 2022-04-27 DIAGNOSIS — M6281 Muscle weakness (generalized): Secondary | ICD-10-CM | POA: Diagnosis not present

## 2022-04-27 DIAGNOSIS — F32A Depression, unspecified: Secondary | ICD-10-CM | POA: Diagnosis not present

## 2022-04-27 DIAGNOSIS — L089 Local infection of the skin and subcutaneous tissue, unspecified: Secondary | ICD-10-CM | POA: Diagnosis not present

## 2022-04-27 DIAGNOSIS — M1990 Unspecified osteoarthritis, unspecified site: Secondary | ICD-10-CM | POA: Diagnosis not present

## 2022-04-27 DIAGNOSIS — M79662 Pain in left lower leg: Secondary | ICD-10-CM | POA: Diagnosis not present

## 2022-04-27 DIAGNOSIS — I361 Nonrheumatic tricuspid (valve) insufficiency: Secondary | ICD-10-CM | POA: Diagnosis not present

## 2022-04-27 DIAGNOSIS — R6521 Severe sepsis with septic shock: Secondary | ICD-10-CM | POA: Diagnosis not present

## 2022-04-27 DIAGNOSIS — Z79899 Other long term (current) drug therapy: Secondary | ICD-10-CM | POA: Diagnosis not present

## 2022-04-27 DIAGNOSIS — I82612 Acute embolism and thrombosis of superficial veins of left upper extremity: Secondary | ICD-10-CM | POA: Diagnosis not present

## 2022-04-27 DIAGNOSIS — Z853 Personal history of malignant neoplasm of breast: Secondary | ICD-10-CM | POA: Diagnosis not present

## 2022-04-27 DIAGNOSIS — R600 Localized edema: Secondary | ICD-10-CM | POA: Diagnosis not present

## 2022-04-27 DIAGNOSIS — M7989 Other specified soft tissue disorders: Secondary | ICD-10-CM | POA: Diagnosis not present

## 2022-04-27 DIAGNOSIS — I808 Phlebitis and thrombophlebitis of other sites: Secondary | ICD-10-CM | POA: Diagnosis not present

## 2022-04-27 DIAGNOSIS — N189 Chronic kidney disease, unspecified: Secondary | ICD-10-CM | POA: Diagnosis not present

## 2022-04-27 DIAGNOSIS — S81852A Open bite, left lower leg, initial encounter: Secondary | ICD-10-CM | POA: Diagnosis not present

## 2022-04-27 DIAGNOSIS — L03116 Cellulitis of left lower limb: Secondary | ICD-10-CM | POA: Diagnosis not present

## 2022-04-27 DIAGNOSIS — N179 Acute kidney failure, unspecified: Secondary | ICD-10-CM | POA: Diagnosis not present

## 2022-04-30 DIAGNOSIS — I361 Nonrheumatic tricuspid (valve) insufficiency: Secondary | ICD-10-CM

## 2022-05-03 DIAGNOSIS — R6521 Severe sepsis with septic shock: Secondary | ICD-10-CM | POA: Diagnosis not present

## 2022-05-03 DIAGNOSIS — A419 Sepsis, unspecified organism: Secondary | ICD-10-CM | POA: Diagnosis not present

## 2022-05-03 DIAGNOSIS — I83018 Varicose veins of right lower extremity with ulcer other part of lower leg: Secondary | ICD-10-CM | POA: Diagnosis not present

## 2022-05-03 DIAGNOSIS — F32A Depression, unspecified: Secondary | ICD-10-CM | POA: Diagnosis not present

## 2022-05-03 DIAGNOSIS — Z7401 Bed confinement status: Secondary | ICD-10-CM | POA: Diagnosis not present

## 2022-05-03 DIAGNOSIS — J449 Chronic obstructive pulmonary disease, unspecified: Secondary | ICD-10-CM | POA: Diagnosis not present

## 2022-05-03 DIAGNOSIS — N179 Acute kidney failure, unspecified: Secondary | ICD-10-CM | POA: Diagnosis not present

## 2022-05-03 DIAGNOSIS — R41841 Cognitive communication deficit: Secondary | ICD-10-CM | POA: Diagnosis not present

## 2022-05-03 DIAGNOSIS — L97829 Non-pressure chronic ulcer of other part of left lower leg with unspecified severity: Secondary | ICD-10-CM | POA: Diagnosis not present

## 2022-05-03 DIAGNOSIS — L03116 Cellulitis of left lower limb: Secondary | ICD-10-CM | POA: Diagnosis not present

## 2022-05-03 DIAGNOSIS — M25572 Pain in left ankle and joints of left foot: Secondary | ICD-10-CM | POA: Diagnosis not present

## 2022-05-03 DIAGNOSIS — M6281 Muscle weakness (generalized): Secondary | ICD-10-CM | POA: Diagnosis not present

## 2022-05-03 DIAGNOSIS — F419 Anxiety disorder, unspecified: Secondary | ICD-10-CM | POA: Diagnosis not present

## 2022-05-03 DIAGNOSIS — L089 Local infection of the skin and subcutaneous tissue, unspecified: Secondary | ICD-10-CM | POA: Diagnosis not present

## 2022-05-03 DIAGNOSIS — N189 Chronic kidney disease, unspecified: Secondary | ICD-10-CM | POA: Diagnosis not present

## 2022-05-03 DIAGNOSIS — F17200 Nicotine dependence, unspecified, uncomplicated: Secondary | ICD-10-CM | POA: Diagnosis not present

## 2022-05-03 DIAGNOSIS — R262 Difficulty in walking, not elsewhere classified: Secondary | ICD-10-CM | POA: Diagnosis not present

## 2022-05-06 DIAGNOSIS — J449 Chronic obstructive pulmonary disease, unspecified: Secondary | ICD-10-CM | POA: Diagnosis not present

## 2022-05-06 DIAGNOSIS — L03116 Cellulitis of left lower limb: Secondary | ICD-10-CM | POA: Diagnosis not present

## 2022-05-06 DIAGNOSIS — F17200 Nicotine dependence, unspecified, uncomplicated: Secondary | ICD-10-CM | POA: Diagnosis not present

## 2022-05-06 DIAGNOSIS — R262 Difficulty in walking, not elsewhere classified: Secondary | ICD-10-CM | POA: Diagnosis not present

## 2022-05-09 DIAGNOSIS — L97829 Non-pressure chronic ulcer of other part of left lower leg with unspecified severity: Secondary | ICD-10-CM | POA: Diagnosis not present

## 2022-05-14 DIAGNOSIS — L03116 Cellulitis of left lower limb: Secondary | ICD-10-CM | POA: Diagnosis not present

## 2022-05-14 DIAGNOSIS — F419 Anxiety disorder, unspecified: Secondary | ICD-10-CM | POA: Diagnosis not present

## 2022-05-14 DIAGNOSIS — F17200 Nicotine dependence, unspecified, uncomplicated: Secondary | ICD-10-CM | POA: Diagnosis not present

## 2022-05-14 DIAGNOSIS — R262 Difficulty in walking, not elsewhere classified: Secondary | ICD-10-CM | POA: Diagnosis not present

## 2022-05-16 DIAGNOSIS — I83018 Varicose veins of right lower extremity with ulcer other part of lower leg: Secondary | ICD-10-CM | POA: Diagnosis not present

## 2022-05-23 DIAGNOSIS — L97829 Non-pressure chronic ulcer of other part of left lower leg with unspecified severity: Secondary | ICD-10-CM | POA: Diagnosis not present

## 2022-05-27 DIAGNOSIS — M159 Polyosteoarthritis, unspecified: Secondary | ICD-10-CM | POA: Diagnosis not present

## 2022-05-27 DIAGNOSIS — F419 Anxiety disorder, unspecified: Secondary | ICD-10-CM | POA: Diagnosis not present

## 2022-05-27 DIAGNOSIS — F3341 Major depressive disorder, recurrent, in partial remission: Secondary | ICD-10-CM | POA: Diagnosis not present

## 2022-05-27 DIAGNOSIS — K219 Gastro-esophageal reflux disease without esophagitis: Secondary | ICD-10-CM | POA: Diagnosis not present

## 2022-05-27 DIAGNOSIS — J449 Chronic obstructive pulmonary disease, unspecified: Secondary | ICD-10-CM | POA: Diagnosis not present

## 2022-05-27 DIAGNOSIS — R5382 Chronic fatigue, unspecified: Secondary | ICD-10-CM | POA: Diagnosis not present

## 2022-05-27 DIAGNOSIS — R252 Cramp and spasm: Secondary | ICD-10-CM | POA: Diagnosis not present

## 2022-05-27 DIAGNOSIS — M8000XA Age-related osteoporosis with current pathological fracture, unspecified site, initial encounter for fracture: Secondary | ICD-10-CM | POA: Diagnosis not present

## 2022-05-27 DIAGNOSIS — G629 Polyneuropathy, unspecified: Secondary | ICD-10-CM | POA: Diagnosis not present

## 2022-05-27 DIAGNOSIS — R600 Localized edema: Secondary | ICD-10-CM | POA: Diagnosis not present

## 2022-05-27 DIAGNOSIS — L0390 Cellulitis, unspecified: Secondary | ICD-10-CM | POA: Diagnosis not present

## 2022-05-27 DIAGNOSIS — I10 Essential (primary) hypertension: Secondary | ICD-10-CM | POA: Diagnosis not present

## 2022-06-05 DIAGNOSIS — F3341 Major depressive disorder, recurrent, in partial remission: Secondary | ICD-10-CM | POA: Diagnosis not present

## 2022-06-05 DIAGNOSIS — J449 Chronic obstructive pulmonary disease, unspecified: Secondary | ICD-10-CM | POA: Diagnosis not present

## 2022-06-05 DIAGNOSIS — M8000XA Age-related osteoporosis with current pathological fracture, unspecified site, initial encounter for fracture: Secondary | ICD-10-CM | POA: Diagnosis not present

## 2022-06-05 DIAGNOSIS — L0390 Cellulitis, unspecified: Secondary | ICD-10-CM | POA: Diagnosis not present

## 2022-06-05 DIAGNOSIS — G250 Essential tremor: Secondary | ICD-10-CM | POA: Diagnosis not present

## 2022-06-05 DIAGNOSIS — R600 Localized edema: Secondary | ICD-10-CM | POA: Diagnosis not present

## 2022-06-05 DIAGNOSIS — M159 Polyosteoarthritis, unspecified: Secondary | ICD-10-CM | POA: Diagnosis not present

## 2022-06-05 DIAGNOSIS — R5382 Chronic fatigue, unspecified: Secondary | ICD-10-CM | POA: Diagnosis not present

## 2022-06-05 DIAGNOSIS — I10 Essential (primary) hypertension: Secondary | ICD-10-CM | POA: Diagnosis not present

## 2022-06-05 DIAGNOSIS — G629 Polyneuropathy, unspecified: Secondary | ICD-10-CM | POA: Diagnosis not present

## 2022-06-05 DIAGNOSIS — F419 Anxiety disorder, unspecified: Secondary | ICD-10-CM | POA: Diagnosis not present

## 2022-06-05 DIAGNOSIS — E785 Hyperlipidemia, unspecified: Secondary | ICD-10-CM | POA: Diagnosis not present

## 2022-06-05 DIAGNOSIS — K219 Gastro-esophageal reflux disease without esophagitis: Secondary | ICD-10-CM | POA: Diagnosis not present

## 2022-06-19 DIAGNOSIS — J449 Chronic obstructive pulmonary disease, unspecified: Secondary | ICD-10-CM | POA: Diagnosis not present

## 2022-06-19 DIAGNOSIS — G250 Essential tremor: Secondary | ICD-10-CM | POA: Diagnosis not present

## 2022-06-19 DIAGNOSIS — M159 Polyosteoarthritis, unspecified: Secondary | ICD-10-CM | POA: Diagnosis not present

## 2022-06-19 DIAGNOSIS — K219 Gastro-esophageal reflux disease without esophagitis: Secondary | ICD-10-CM | POA: Diagnosis not present

## 2022-06-19 DIAGNOSIS — F419 Anxiety disorder, unspecified: Secondary | ICD-10-CM | POA: Diagnosis not present

## 2022-06-19 DIAGNOSIS — G629 Polyneuropathy, unspecified: Secondary | ICD-10-CM | POA: Diagnosis not present

## 2022-06-19 DIAGNOSIS — F3341 Major depressive disorder, recurrent, in partial remission: Secondary | ICD-10-CM | POA: Diagnosis not present

## 2022-06-19 DIAGNOSIS — R600 Localized edema: Secondary | ICD-10-CM | POA: Diagnosis not present

## 2022-06-19 DIAGNOSIS — R5383 Other fatigue: Secondary | ICD-10-CM | POA: Diagnosis not present

## 2022-06-19 DIAGNOSIS — M8000XA Age-related osteoporosis with current pathological fracture, unspecified site, initial encounter for fracture: Secondary | ICD-10-CM | POA: Diagnosis not present

## 2022-06-19 DIAGNOSIS — R252 Cramp and spasm: Secondary | ICD-10-CM | POA: Diagnosis not present

## 2022-06-19 DIAGNOSIS — I10 Essential (primary) hypertension: Secondary | ICD-10-CM | POA: Diagnosis not present

## 2022-06-26 DIAGNOSIS — G8929 Other chronic pain: Secondary | ICD-10-CM | POA: Diagnosis not present

## 2022-06-26 DIAGNOSIS — G629 Polyneuropathy, unspecified: Secondary | ICD-10-CM | POA: Diagnosis not present

## 2022-06-26 DIAGNOSIS — F3341 Major depressive disorder, recurrent, in partial remission: Secondary | ICD-10-CM | POA: Diagnosis not present

## 2022-06-26 DIAGNOSIS — R600 Localized edema: Secondary | ICD-10-CM | POA: Diagnosis not present

## 2022-06-26 DIAGNOSIS — M159 Polyosteoarthritis, unspecified: Secondary | ICD-10-CM | POA: Diagnosis not present

## 2022-06-26 DIAGNOSIS — J449 Chronic obstructive pulmonary disease, unspecified: Secondary | ICD-10-CM | POA: Diagnosis not present

## 2022-06-26 DIAGNOSIS — F419 Anxiety disorder, unspecified: Secondary | ICD-10-CM | POA: Diagnosis not present

## 2022-06-26 DIAGNOSIS — R252 Cramp and spasm: Secondary | ICD-10-CM | POA: Diagnosis not present

## 2022-06-26 DIAGNOSIS — G250 Essential tremor: Secondary | ICD-10-CM | POA: Diagnosis not present

## 2022-06-26 DIAGNOSIS — M545 Low back pain, unspecified: Secondary | ICD-10-CM | POA: Diagnosis not present

## 2022-06-26 DIAGNOSIS — K219 Gastro-esophageal reflux disease without esophagitis: Secondary | ICD-10-CM | POA: Diagnosis not present

## 2022-06-26 DIAGNOSIS — I10 Essential (primary) hypertension: Secondary | ICD-10-CM | POA: Diagnosis not present

## 2022-07-03 DIAGNOSIS — R739 Hyperglycemia, unspecified: Secondary | ICD-10-CM | POA: Diagnosis not present

## 2022-07-03 DIAGNOSIS — G8929 Other chronic pain: Secondary | ICD-10-CM | POA: Diagnosis not present

## 2022-07-03 DIAGNOSIS — F419 Anxiety disorder, unspecified: Secondary | ICD-10-CM | POA: Diagnosis not present

## 2022-07-03 DIAGNOSIS — R600 Localized edema: Secondary | ICD-10-CM | POA: Diagnosis not present

## 2022-07-03 DIAGNOSIS — I10 Essential (primary) hypertension: Secondary | ICD-10-CM | POA: Diagnosis not present

## 2022-07-03 DIAGNOSIS — M549 Dorsalgia, unspecified: Secondary | ICD-10-CM | POA: Diagnosis not present

## 2022-07-03 DIAGNOSIS — M159 Polyosteoarthritis, unspecified: Secondary | ICD-10-CM | POA: Diagnosis not present

## 2022-07-03 DIAGNOSIS — K219 Gastro-esophageal reflux disease without esophagitis: Secondary | ICD-10-CM | POA: Diagnosis not present

## 2022-07-03 DIAGNOSIS — G629 Polyneuropathy, unspecified: Secondary | ICD-10-CM | POA: Diagnosis not present

## 2022-07-03 DIAGNOSIS — R252 Cramp and spasm: Secondary | ICD-10-CM | POA: Diagnosis not present

## 2022-07-03 DIAGNOSIS — G250 Essential tremor: Secondary | ICD-10-CM | POA: Diagnosis not present

## 2022-07-03 DIAGNOSIS — F3341 Major depressive disorder, recurrent, in partial remission: Secondary | ICD-10-CM | POA: Diagnosis not present

## 2022-07-03 DIAGNOSIS — J449 Chronic obstructive pulmonary disease, unspecified: Secondary | ICD-10-CM | POA: Diagnosis not present

## 2022-07-17 DIAGNOSIS — M549 Dorsalgia, unspecified: Secondary | ICD-10-CM | POA: Diagnosis not present

## 2022-07-17 DIAGNOSIS — J449 Chronic obstructive pulmonary disease, unspecified: Secondary | ICD-10-CM | POA: Diagnosis not present

## 2022-07-17 DIAGNOSIS — G629 Polyneuropathy, unspecified: Secondary | ICD-10-CM | POA: Diagnosis not present

## 2022-07-17 DIAGNOSIS — G8929 Other chronic pain: Secondary | ICD-10-CM | POA: Diagnosis not present

## 2022-07-17 DIAGNOSIS — G250 Essential tremor: Secondary | ICD-10-CM | POA: Diagnosis not present

## 2022-07-17 DIAGNOSIS — Z23 Encounter for immunization: Secondary | ICD-10-CM | POA: Diagnosis not present

## 2022-07-17 DIAGNOSIS — F3341 Major depressive disorder, recurrent, in partial remission: Secondary | ICD-10-CM | POA: Diagnosis not present

## 2022-07-17 DIAGNOSIS — F419 Anxiety disorder, unspecified: Secondary | ICD-10-CM | POA: Diagnosis not present

## 2022-07-17 DIAGNOSIS — R252 Cramp and spasm: Secondary | ICD-10-CM | POA: Diagnosis not present

## 2022-07-17 DIAGNOSIS — I10 Essential (primary) hypertension: Secondary | ICD-10-CM | POA: Diagnosis not present

## 2022-07-17 DIAGNOSIS — R600 Localized edema: Secondary | ICD-10-CM | POA: Diagnosis not present

## 2022-07-17 DIAGNOSIS — K219 Gastro-esophageal reflux disease without esophagitis: Secondary | ICD-10-CM | POA: Diagnosis not present

## 2022-07-31 DIAGNOSIS — R252 Cramp and spasm: Secondary | ICD-10-CM | POA: Diagnosis not present

## 2022-07-31 DIAGNOSIS — F3341 Major depressive disorder, recurrent, in partial remission: Secondary | ICD-10-CM | POA: Diagnosis not present

## 2022-07-31 DIAGNOSIS — G629 Polyneuropathy, unspecified: Secondary | ICD-10-CM | POA: Diagnosis not present

## 2022-07-31 DIAGNOSIS — F419 Anxiety disorder, unspecified: Secondary | ICD-10-CM | POA: Diagnosis not present

## 2022-07-31 DIAGNOSIS — R600 Localized edema: Secondary | ICD-10-CM | POA: Diagnosis not present

## 2022-07-31 DIAGNOSIS — M549 Dorsalgia, unspecified: Secondary | ICD-10-CM | POA: Diagnosis not present

## 2022-07-31 DIAGNOSIS — I10 Essential (primary) hypertension: Secondary | ICD-10-CM | POA: Diagnosis not present

## 2022-07-31 DIAGNOSIS — G8929 Other chronic pain: Secondary | ICD-10-CM | POA: Diagnosis not present

## 2022-07-31 DIAGNOSIS — J449 Chronic obstructive pulmonary disease, unspecified: Secondary | ICD-10-CM | POA: Diagnosis not present

## 2022-07-31 DIAGNOSIS — K219 Gastro-esophageal reflux disease without esophagitis: Secondary | ICD-10-CM | POA: Diagnosis not present

## 2022-07-31 DIAGNOSIS — M159 Polyosteoarthritis, unspecified: Secondary | ICD-10-CM | POA: Diagnosis not present

## 2022-07-31 DIAGNOSIS — G250 Essential tremor: Secondary | ICD-10-CM | POA: Diagnosis not present

## 2022-08-01 DIAGNOSIS — M545 Low back pain, unspecified: Secondary | ICD-10-CM | POA: Diagnosis not present

## 2022-08-01 DIAGNOSIS — M5416 Radiculopathy, lumbar region: Secondary | ICD-10-CM | POA: Diagnosis not present

## 2022-08-07 DIAGNOSIS — M47816 Spondylosis without myelopathy or radiculopathy, lumbar region: Secondary | ICD-10-CM | POA: Diagnosis not present

## 2022-08-07 DIAGNOSIS — M5416 Radiculopathy, lumbar region: Secondary | ICD-10-CM | POA: Diagnosis not present

## 2022-08-13 DIAGNOSIS — M545 Low back pain, unspecified: Secondary | ICD-10-CM | POA: Diagnosis not present

## 2022-08-13 DIAGNOSIS — M5416 Radiculopathy, lumbar region: Secondary | ICD-10-CM | POA: Diagnosis not present

## 2022-08-22 DIAGNOSIS — M549 Dorsalgia, unspecified: Secondary | ICD-10-CM | POA: Diagnosis not present

## 2022-08-22 DIAGNOSIS — M159 Polyosteoarthritis, unspecified: Secondary | ICD-10-CM | POA: Diagnosis not present

## 2022-08-22 DIAGNOSIS — G250 Essential tremor: Secondary | ICD-10-CM | POA: Diagnosis not present

## 2022-08-22 DIAGNOSIS — K219 Gastro-esophageal reflux disease without esophagitis: Secondary | ICD-10-CM | POA: Diagnosis not present

## 2022-08-22 DIAGNOSIS — F3341 Major depressive disorder, recurrent, in partial remission: Secondary | ICD-10-CM | POA: Diagnosis not present

## 2022-08-22 DIAGNOSIS — I10 Essential (primary) hypertension: Secondary | ICD-10-CM | POA: Diagnosis not present

## 2022-08-22 DIAGNOSIS — R600 Localized edema: Secondary | ICD-10-CM | POA: Diagnosis not present

## 2022-08-22 DIAGNOSIS — G629 Polyneuropathy, unspecified: Secondary | ICD-10-CM | POA: Diagnosis not present

## 2022-08-22 DIAGNOSIS — F419 Anxiety disorder, unspecified: Secondary | ICD-10-CM | POA: Diagnosis not present

## 2022-08-22 DIAGNOSIS — M25512 Pain in left shoulder: Secondary | ICD-10-CM | POA: Diagnosis not present

## 2022-08-22 DIAGNOSIS — G8929 Other chronic pain: Secondary | ICD-10-CM | POA: Diagnosis not present

## 2022-08-22 DIAGNOSIS — J449 Chronic obstructive pulmonary disease, unspecified: Secondary | ICD-10-CM | POA: Diagnosis not present

## 2022-08-28 DIAGNOSIS — R600 Localized edema: Secondary | ICD-10-CM | POA: Diagnosis not present

## 2022-08-28 DIAGNOSIS — R252 Cramp and spasm: Secondary | ICD-10-CM | POA: Diagnosis not present

## 2022-08-28 DIAGNOSIS — N1832 Chronic kidney disease, stage 3b: Secondary | ICD-10-CM | POA: Diagnosis not present

## 2022-08-28 DIAGNOSIS — M25512 Pain in left shoulder: Secondary | ICD-10-CM | POA: Diagnosis not present

## 2022-08-28 DIAGNOSIS — M549 Dorsalgia, unspecified: Secondary | ICD-10-CM | POA: Diagnosis not present

## 2022-08-28 DIAGNOSIS — F3341 Major depressive disorder, recurrent, in partial remission: Secondary | ICD-10-CM | POA: Diagnosis not present

## 2022-08-28 DIAGNOSIS — K219 Gastro-esophageal reflux disease without esophagitis: Secondary | ICD-10-CM | POA: Diagnosis not present

## 2022-08-28 DIAGNOSIS — G629 Polyneuropathy, unspecified: Secondary | ICD-10-CM | POA: Diagnosis not present

## 2022-08-28 DIAGNOSIS — G8929 Other chronic pain: Secondary | ICD-10-CM | POA: Diagnosis not present

## 2022-08-28 DIAGNOSIS — F17200 Nicotine dependence, unspecified, uncomplicated: Secondary | ICD-10-CM | POA: Diagnosis not present

## 2022-08-28 DIAGNOSIS — J449 Chronic obstructive pulmonary disease, unspecified: Secondary | ICD-10-CM | POA: Diagnosis not present

## 2022-08-28 DIAGNOSIS — I10 Essential (primary) hypertension: Secondary | ICD-10-CM | POA: Diagnosis not present

## 2022-09-11 DIAGNOSIS — R600 Localized edema: Secondary | ICD-10-CM | POA: Diagnosis not present

## 2022-09-11 DIAGNOSIS — F17200 Nicotine dependence, unspecified, uncomplicated: Secondary | ICD-10-CM | POA: Diagnosis not present

## 2022-09-11 DIAGNOSIS — M159 Polyosteoarthritis, unspecified: Secondary | ICD-10-CM | POA: Diagnosis not present

## 2022-09-11 DIAGNOSIS — I10 Essential (primary) hypertension: Secondary | ICD-10-CM | POA: Diagnosis not present

## 2022-09-11 DIAGNOSIS — K219 Gastro-esophageal reflux disease without esophagitis: Secondary | ICD-10-CM | POA: Diagnosis not present

## 2022-09-11 DIAGNOSIS — N1832 Chronic kidney disease, stage 3b: Secondary | ICD-10-CM | POA: Diagnosis not present

## 2022-09-11 DIAGNOSIS — J449 Chronic obstructive pulmonary disease, unspecified: Secondary | ICD-10-CM | POA: Diagnosis not present

## 2022-09-11 DIAGNOSIS — G629 Polyneuropathy, unspecified: Secondary | ICD-10-CM | POA: Diagnosis not present

## 2022-09-11 DIAGNOSIS — G8929 Other chronic pain: Secondary | ICD-10-CM | POA: Diagnosis not present

## 2022-09-11 DIAGNOSIS — R252 Cramp and spasm: Secondary | ICD-10-CM | POA: Diagnosis not present

## 2022-09-11 DIAGNOSIS — F3341 Major depressive disorder, recurrent, in partial remission: Secondary | ICD-10-CM | POA: Diagnosis not present

## 2022-09-11 DIAGNOSIS — E785 Hyperlipidemia, unspecified: Secondary | ICD-10-CM | POA: Diagnosis not present

## 2022-09-11 DIAGNOSIS — M549 Dorsalgia, unspecified: Secondary | ICD-10-CM | POA: Diagnosis not present

## 2022-09-25 DIAGNOSIS — F419 Anxiety disorder, unspecified: Secondary | ICD-10-CM | POA: Diagnosis not present

## 2022-09-25 DIAGNOSIS — G8929 Other chronic pain: Secondary | ICD-10-CM | POA: Diagnosis not present

## 2022-09-25 DIAGNOSIS — M549 Dorsalgia, unspecified: Secondary | ICD-10-CM | POA: Diagnosis not present

## 2022-09-25 DIAGNOSIS — N1832 Chronic kidney disease, stage 3b: Secondary | ICD-10-CM | POA: Diagnosis not present

## 2022-09-25 DIAGNOSIS — R600 Localized edema: Secondary | ICD-10-CM | POA: Diagnosis not present

## 2022-09-25 DIAGNOSIS — G629 Polyneuropathy, unspecified: Secondary | ICD-10-CM | POA: Diagnosis not present

## 2022-09-25 DIAGNOSIS — K219 Gastro-esophageal reflux disease without esophagitis: Secondary | ICD-10-CM | POA: Diagnosis not present

## 2022-09-25 DIAGNOSIS — R252 Cramp and spasm: Secondary | ICD-10-CM | POA: Diagnosis not present

## 2022-09-25 DIAGNOSIS — M159 Polyosteoarthritis, unspecified: Secondary | ICD-10-CM | POA: Diagnosis not present

## 2022-09-25 DIAGNOSIS — F3341 Major depressive disorder, recurrent, in partial remission: Secondary | ICD-10-CM | POA: Diagnosis not present

## 2022-09-25 DIAGNOSIS — F17200 Nicotine dependence, unspecified, uncomplicated: Secondary | ICD-10-CM | POA: Diagnosis not present

## 2022-09-25 DIAGNOSIS — I10 Essential (primary) hypertension: Secondary | ICD-10-CM | POA: Diagnosis not present

## 2023-03-07 IMAGING — MR MR ABDOMEN WO/W CM
18 of 22 series · 42 of 48 positions shown · IV contrast (gadavist)
Comparison: Abdominal ultrasound 12/13/2020

CLINICAL DATA: Elevated liver function studies. Family history of
pancreatic cancer.

EXAM:
MRI ABDOMEN WITHOUT AND WITH CONTRAST
TECHNIQUE: Multiplanar multisequence MR imaging of the abdomen was performed
both before and after the administration of intravenous contrast.
CONTRAST:  5mL GADAVIST GADOBUTROL 1 MMOL/ML IV SOLN

[Series 2: T2 fat-sat · axial · 6.0mm · 1.25mm/px · 1 of 32 slices shown]
[im 1/32]
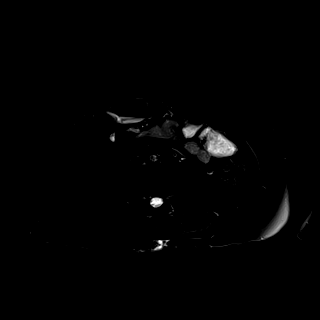

[Series 4: DWI · axial · 6.0mm · 1.40mm/px · z∈[-204,+5]mm · 2 of 60 slices shown (1 of 2)]
[im 1/60]
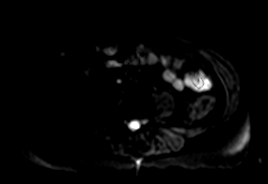
[im 60/60]
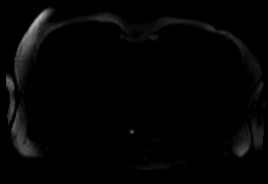

[Series 5: DWI · axial · 6.0mm · 1.40mm/px · 1 of 30 slices shown (2 of 2)]
[im 1/30]
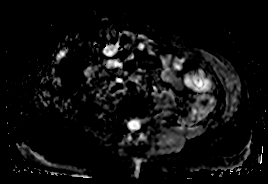

[Series 7: T2 · coronal · 7.0mm · 1.46mm/px · 1 of 30 slices shown (1 of 2)]
[im 1/30]
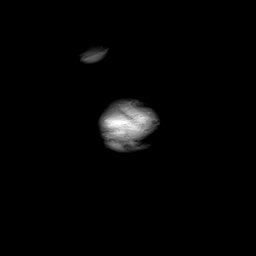

[Series 8: T1 · axial · 3.1mm · 1.17mm/px · z∈[-219,+1]mm · 3 of 72 slices shown (1 of 2)]
[im 1/72]
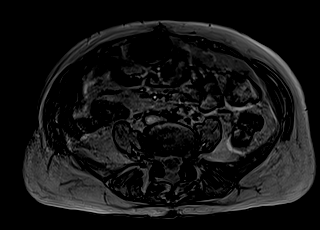
[im 36/72]
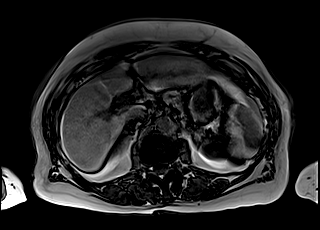
[im 72/72]
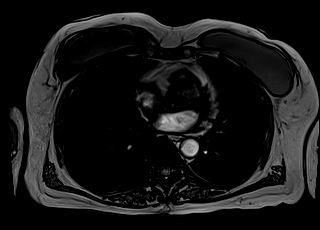

[Series 9: T1 · axial · 3.1mm · 1.17mm/px · z∈[-219,+1]mm · 3 of 72 slices shown (2 of 2)]
[im 1/72]
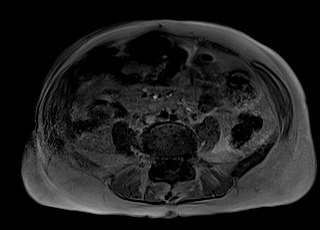
[im 36/72]
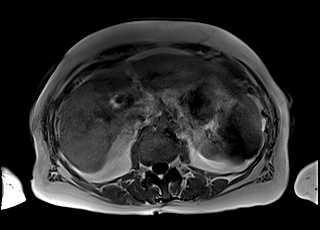
[im 72/72]
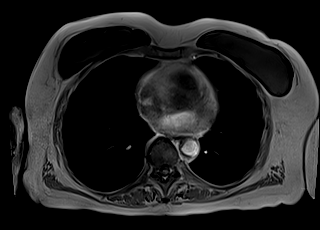

[Series 10: bSSFP · axial · 7.0mm · 1.25mm/px · 1 of 28 slices shown]
[im 1/28]
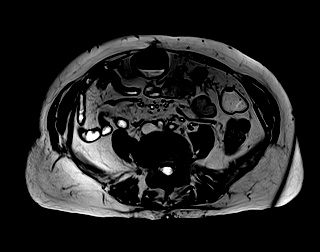

[Series 16: T1 dynamic · axial · 3.0mm · 1.17mm/px · z∈[-227,+10]mm · 3 of 80 slices shown (1 of 10)]
[im 1/80]
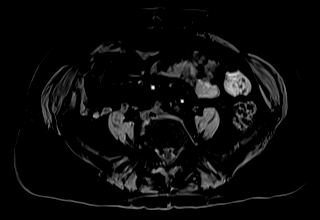
[im 40/80]
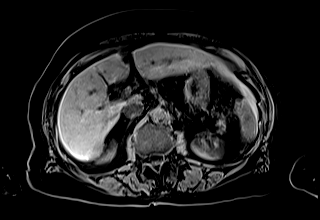
[im 80/80]
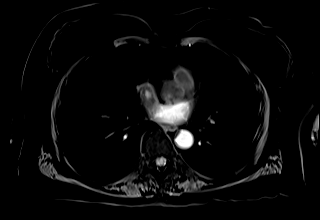

[Series 20: T1 dynamic · axial · 3.0mm · 1.17mm/px · z∈[-227,+10]mm · 3 of 80 slices shown (2 of 10)]
[im 1/80]
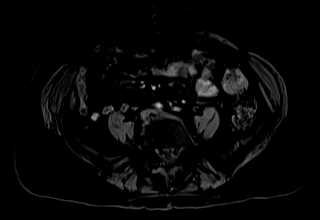
[im 40/80]
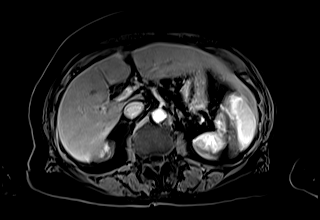
[im 80/80]
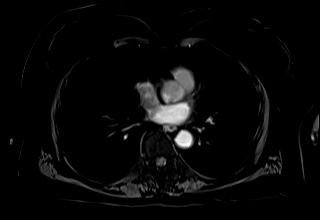

[Series 21: T1 dynamic · axial · 3.0mm · 1.17mm/px · z∈[-227,+10]mm · 3 of 80 slices shown (3 of 10)]
[im 1/80]
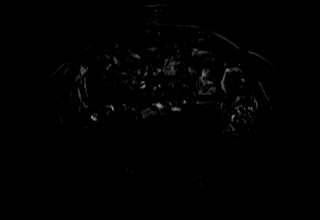
[im 40/80]
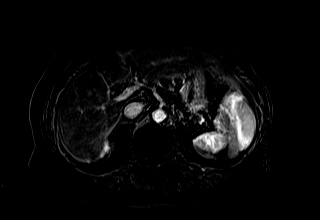
[im 80/80]
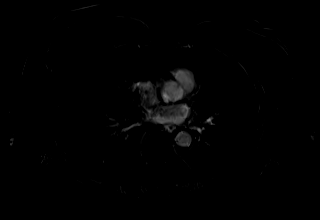

[Series 24: T1 dynamic · axial · 3.0mm · 1.17mm/px · z∈[-227,+10]mm · 3 of 80 slices shown (4 of 10)]
[im 1/80]
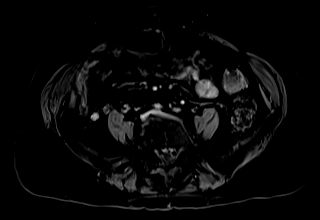
[im 40/80]
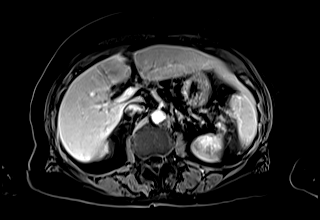
[im 80/80]
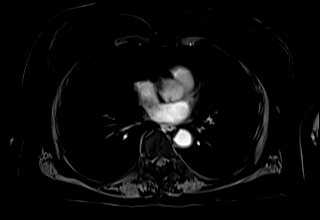

[Series 25: T1 dynamic · axial · 3.0mm · 1.17mm/px · z∈[-227,+10]mm · 3 of 80 slices shown (5 of 10)]
[im 1/80]
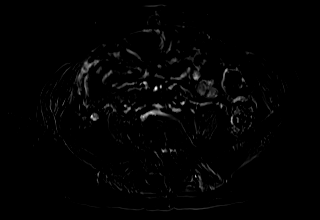
[im 40/80]
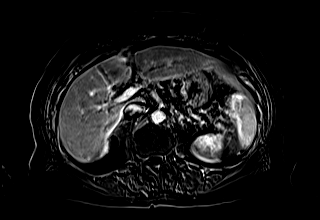
[im 80/80]
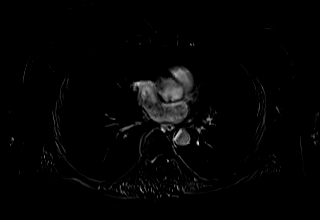

[Series 28: T1 dynamic · axial · 3.0mm · 1.17mm/px · z∈[-227,+10]mm · 3 of 80 slices shown (6 of 10)]
[im 1/80]
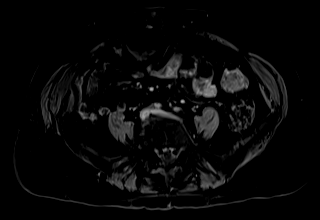
[im 40/80]
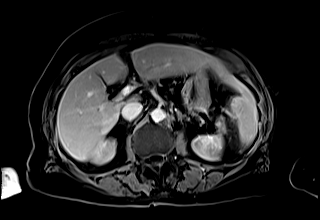
[im 80/80]
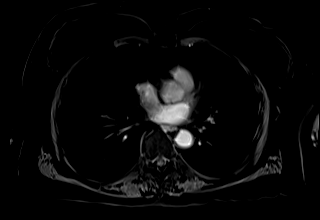

[Series 29: T1 dynamic · axial · 3.0mm · 1.17mm/px · z∈[-227,+10]mm · 3 of 80 slices shown (7 of 10)]
[im 1/80]
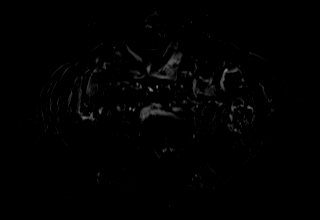
[im 40/80]
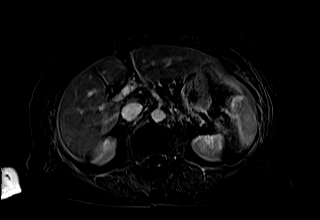
[im 80/80]
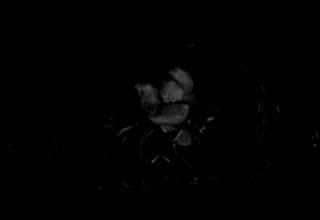

[Series 32: T2 · axial · 6.0mm · 1.46mm/px · 1 of 30 slices shown (2 of 2)]
[im 1/30]
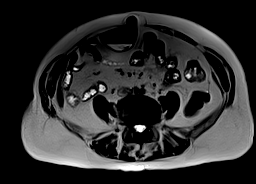

[Series 35: T1 dynamic · axial · 3.0mm · 1.17mm/px · z∈[-227,+10]mm · 3 of 80 slices shown (8 of 10)]
[im 1/80]
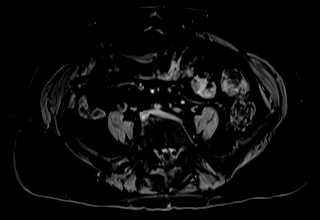
[im 40/80]
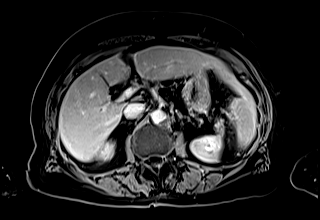
[im 80/80]
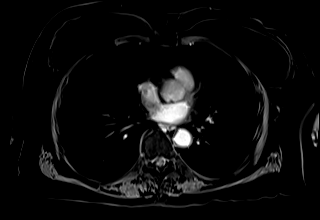

[Series 36: T1 dynamic · axial · 3.0mm · 1.17mm/px · z∈[-227,+10]mm · 3 of 80 slices shown (9 of 10)]
[im 1/80]
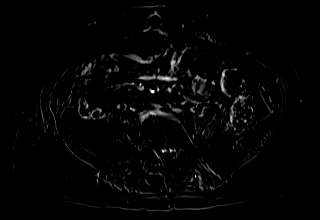
[im 40/80]
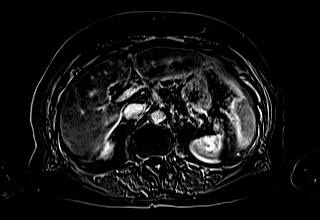
[im 80/80]
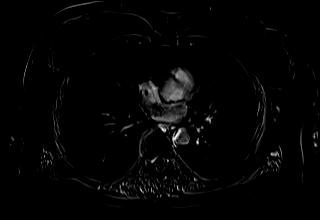

[Series 38: T1 dynamic · coronal · 5.0mm · 1.17mm/px · 2 of 52 slices shown (10 of 10)]
[im 1/52]
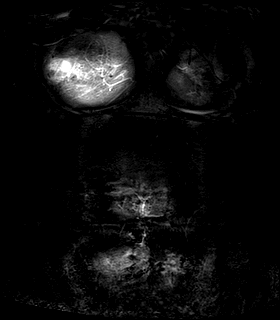
[im 52/52]
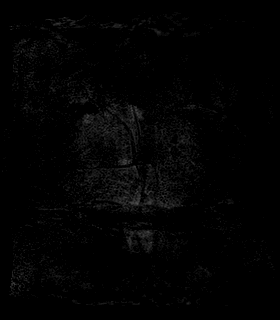

[42 of 48 positions shown; findings below may reference images not displayed]

FINDINGS: Lower chest: The visualized lower chest appears unremarkable aside
from the presence of bilateral breast implants.

Hepatobiliary: Borderline hepatic steatosis with mild signal dropout
on the gradient echo opposed phase images. No focal lesion or
abnormal enhancement. Status post cholecystectomy with mild
intrahepatic and extrahepatic biliary dilatation. The common hepatic
duct measures 8 mm in diameter and tapers distally. The thin section
MRCP images are motion degraded, but there are 2 small filling
defects in the distal common bile duct with low signal consistent
with choledocholithiasis. Periampullary duodenal diverticula are
noted.

Pancreas: No evidence of pancreas divisum, pancreatic ductal
dilatation or surrounding inflammation. No evidence of pancreatic
mass or abnormal enhancement.

Spleen: Normal in size without focal abnormality.

Adrenals/Urinary Tract: Both adrenal glands appear normal. There is
a small cyst in the interpolar region of the right kidney. No
evidence of enhancing renal mass or hydronephrosis.

Stomach/Bowel: As above, periampullary duodenal diverticula. Most of
the small bowel is relatively position in the right abdomen with the
colon in the central and left abdomen. This could relate to previous
partial colon resection or intestinal non rotation. No evidence of
bowel distension or surrounding inflammation.

Vascular/Lymphatic: There are no enlarged abdominal lymph nodes.
Mild aortic atherosclerosis. No acute vascular findings.

Other: There are small ventral hernias containing fat and a knuckle
of colon. No evidence of incarceration.

Musculoskeletal: No acute or significant osseous findings. Mild
spondylosis.
IMPRESSION: 1. No evidence of pancreatic mass, pancreatic ductal dilatation or
surrounding inflammation.
2. Mild intra and extrahepatic biliary dilatation post
cholecystectomy with small filling defects in the distal common bile
duct consistent with choledocholithiasis. Correlate with liver
function studies.
3. Borderline hepatic steatosis.  No focal hepatic abnormalities.
4. Periampullary duodenal diverticula. The distribution of the small
bowel and colon suggests either intestinal non rotation or previous
partial colon resection.

## 2023-04-22 ENCOUNTER — Ambulatory Visit: Payer: Medicare Other | Attending: Cardiology | Admitting: Cardiology

## 2023-04-22 ENCOUNTER — Encounter: Payer: Self-pay | Admitting: Cardiology

## 2023-04-22 VITALS — BP 120/70 | HR 97 | Ht 67.0 in | Wt 150.8 lb

## 2023-04-22 DIAGNOSIS — R0789 Other chest pain: Secondary | ICD-10-CM | POA: Diagnosis present

## 2023-04-22 DIAGNOSIS — R072 Precordial pain: Secondary | ICD-10-CM | POA: Diagnosis not present

## 2023-04-22 DIAGNOSIS — M79605 Pain in left leg: Secondary | ICD-10-CM | POA: Diagnosis present

## 2023-04-22 DIAGNOSIS — F17200 Nicotine dependence, unspecified, uncomplicated: Secondary | ICD-10-CM

## 2023-04-22 DIAGNOSIS — Z79899 Other long term (current) drug therapy: Secondary | ICD-10-CM | POA: Diagnosis present

## 2023-04-22 DIAGNOSIS — M79604 Pain in right leg: Secondary | ICD-10-CM

## 2023-04-22 DIAGNOSIS — I159 Secondary hypertension, unspecified: Secondary | ICD-10-CM | POA: Diagnosis not present

## 2023-04-22 DIAGNOSIS — R0600 Dyspnea, unspecified: Secondary | ICD-10-CM

## 2023-04-22 MED ORDER — NITROGLYCERIN 0.4 MG SL SUBL: 0.4000 mg | SUBLINGUAL_TABLET | SUBLINGUAL | 3 refills | Status: DC | PRN

## 2023-04-22 MED ORDER — METOPROLOL TARTRATE 100 MG PO TABS: 100.0000 mg | ORAL_TABLET | Freq: Once | ORAL | 0 refills | Status: DC

## 2023-04-22 NOTE — Patient Instructions (Signed)
Medication Instructions:  Your physician recommends that you continue on your current medications as directed. Please refer to the Current Medication list given to you today.  *If you need a refill on your cardiac medications before your next appointment, please call your pharmacy*   Lab Work: Your physician recommends that you return for lab work in: Today for CMP, CBC and ProBNP  If you have labs (blood work) drawn today and your tests are completely normal, you will receive your results only by: MyChart Message (if you have MyChart) OR A paper copy in the mail If you have any lab test that is abnormal or we need to change your treatment, we will call you to review the results.   Testing/Procedures: Please arrive at the [facility] main entrance of [facility] at xx:xx AM (30-45 minutes prior to test start time)  [facility] [address] [place], [place] [address] [phone]  Proceed to the [facility] Radiology Department (First Floor).  Please follow these instructions carefully (unless otherwise directed):    On the Night Before the Test: Drink plenty of water. Do not consume any caffeinated/decaffeinated beverages or chocolate 12 hours prior to your test. Do not take any antihistamines 12 hours prior to your test. On the Day of the Test: Drink plenty of water. Do not drink any water within one hour of the test. Do not eat any food 4 hours prior to the test. You may take your regular medications prior to the test. IF NOT ON A BETA BLOCKER - Take 100 mg of lopressor (metoprolol) two hours before the test. After the Test: Drink plenty of water. After receiving IV contrast, you may experience a mild flushed feeling. This is normal. On occasion, you may experience a mild rash up to 24 hours after the test. This is not dangerous. If this occurs, you can take Benadryl 25 mg and increase your fluid intake. If you experience trouble breathing, this can  be serious. If it is severe call 911 IMMEDIATELY. If it is mild, please call our office. If you take any of these medications: Glipizide/Metformin, Avandament, Glucavance, please do not take 48 hours after completing test.    Follow-Up: At [facility], you and your health needs are our priority.  As part of our continuing mission to provide you with exceptional heart care, we have created designated Provider Care Teams.  These Care Teams include your primary Cardiologist (physician) and Advanced Practice Providers (APPs -  Physician Assistants and Nurse Practitioners) who all work together to provide you with the care you need, when you need it.  We recommend signing up for the patient portal called "MyChart".  Sign up information is provided on this After Visit Summary.  MyChart is used to connect with patients for Virtual Visits (Telemedicine).  Patients are able to view lab/test results, encounter notes, upcoming appointments, etc.  Non-urgent messages can be sent to your provider as well.   To learn more about what you can do with MyChart, go to [email or web address].    Your next appointment:   1 month(s)  Provider:   Wallis Bamberg, NP Rosalita Levan) Other Instructions Cardiac CT Angiogram A cardiac CT angiogram is a procedure to look at the heart and the area around the heart. It may be done to help find the cause of chest pains or other symptoms of heart disease. During this procedure, a substance called contrast dye is injected into a vein in the arm. The contrast  highlights the blood vessels in the area to be checked. A large X-ray machine (CT scanner), then takes detailed pictures of the heart and the surrounding area. The procedure is also sometimes called a coronary CT angiogram, coronary artery scanning, or CTA. A cardiac CT angiogram allows the health care provider to see how well blood is flowing to and from the heart. The provider will be able to see if there are any  problems, such as: Blockage or narrowing of the arteries in the heart. Fluid around the heart. Signs of weakness or disease in the muscles, valves, and tissues of the heart. Tell a health care provider about: Any allergies you have. This is especially important if you have had a previous allergic reaction to medicines, contrast dye, or iodine. All medicines you are taking, including vitamins, herbs, eye drops, creams, and over-the-counter medicines. Any bleeding problems you have. Any surgeries you have had. Any medical conditions you have, including kidney problems or kidney failure. Whether you are pregnant or may be pregnant. Any anxiety disorders, chronic pain, or other conditions you have. These may increase your stress or prevent you from lying still. Any history of abnormal heart rhythms or heart procedures. What are the risks? Your provider will talk with you about risks. These may include: Bleeding. Infection. Allergic reactions to medicines or dyes. Damage to other structures or organs. Kidney damage from the contrast dye. Increased risk of cancer from radiation exposure. This risk is low. Talk with your provider about: The risks and benefits of testing. How you can receive the lowest dose of radiation. What happens before the procedure? Wear comfortable clothing and remove any jewelry, glasses, dentures, and hearing aids. Follow instructions from your provider about eating and drinking. These may include: 12 hours before the procedure Avoid caffeine. This includes tea, coffee, soda, energy drinks, and diet pills. Drink plenty of water or other fluids that do not have caffeine in them. Being well hydrated can prevent complications. 4-6 hours before the procedure Stop eating and drinking. This will reduce the risk of nausea from the contrast dye. Ask your provider about changing or stopping your regular medicines. These include: Diabetes medicines. Medicines to treat  problems with erections (erectile dysfunction). If you have kidney problems, you may need to receive IV hydration before and after the test. What happens during the procedure?  Hair on your chest may need to be removed so that small sticky patches called electrodes can be placed on your chest. These will transmit information that helps to monitor your heart during the procedure. An IV will be inserted into one of your veins. You might be given a medicine to control your heart rate during the procedure. This will help to ensure that good images are obtained. You will be asked to lie on an exam table. This table will slide in and out of the CT machine during the procedure. Contrast dye will be injected into the IV. You might feel warm, or you may get a metallic taste in your mouth. You may be given medicines to relax or dilate the arteries in your heart. If you are allergic to contrast dyes or iodine you may be given medicine before the test to reduce the risk of an allergic reaction. The table that you are lying on will move into the CT machine tunnel for the scan. The person running the machine will give you instructions while the scans are being done. You may be asked to: Keep your arms above your  head. Hold your breath for short periods. Stay very still, even if the table is moving. The procedure may vary among providers and hospitals. What can I expect after the procedure? After your procedure, it is common to have: A metallic taste in your mouth from the contrast dye. A feeling of warmth. A headache from the heart medicine. Follow these instructions at home: Take over-the-counter and prescription medicines only as told by your provider. If you are told, drink enough fluid to keep your pee pale yellow. This will help to flush the contrast dye out of your body. Most people can return to their normal activities right after the procedure. Ask your provider what activities are safe for  you. It is up to you to get the results of your procedure. Ask your provider, or the department that is doing the procedure, when your results will be ready. Contact a health care provider if: You have any symptoms of allergy to the contrast dye. These include: Shortness of breath. Rash or hives. A racing heartbeat. You notice a change in your peeing (urination). This information is not intended to replace advice given to you by your health care provider. Make sure you discuss any questions you have with your health care provider. Document Revised: 05/03/2022 Document Reviewed: 05/03/2022 Elsevier Patient Education  2024 [employer].

## 2023-04-22 NOTE — Progress Notes (Signed)
Cardiology Office Note:  .   Date:  04/22/2023  ID:  Tina George, DOB 1946-04-13, MRN [id number] PCP: Simone Curia, MD  [facility] Providers Cardiologist:  None    History of Present Illness: .   Tina George is a 77 y.o. female with a past medical history of dyslipidemia, hypertension, depression, smoker, family history of premature coronary artery disease, benign essential tremor, CKD stage IIIb, neuropathy.   Last evaluated by Dr. Servando Salina in March 2022 for shortness of breath, she recently had stress evaluation which was normal, she was advised to follow-up with pulmonary for shortness of breath.    Echo on 04/30/2022 revealed an EF 60 to 65%, impaired relaxation, no valvular abnormalities. 11/15/2020 Lexiscan low risk, normal study. 10/12/2020 ABI normal  She presents today for follow-up at the behest of her PCP after having not been seen since 2022.  She states she does not feel well, she has multiple complaints.  She is short of breath, has pedal edema, likely combination of venous insufficiency and peripheral artery disease, she also endorses a squeezing sensation at times in her chest. She denies palpitations, pnd, orthopnea, n, v, dizziness, syncope, weight gain, or early satiety.   ROS: Review of Systems  Constitutional:  Positive for malaise/fatigue.  HENT: Negative.    Respiratory:  Positive for shortness of breath.   Cardiovascular:  Positive for palpitations and leg swelling.  Gastrointestinal: Negative.   Genitourinary: Negative.   Musculoskeletal: Negative.   Neurological: Negative.   Endo/Heme/Allergies:  Bruises/bleeds easily.  Psychiatric/Behavioral: Negative.       Studies Reviewed: .        Cardiac Studies & Procedures     STRESS TESTS  MYOCARDIAL PERFUSION IMAGING 11/15/2020  Narrative  The left ventricular ejection fraction is hyperdynamic (>65%).  Nuclear stress EF: 70%.  There was no ST segment deviation noted during stress.  No T wave  inversion was noted during stress.  The study is normal.  This is a low risk study.   ECHOCARDIOGRAM  ECHOCARDIOGRAM COMPLETE 10/12/2020  Narrative ECHOCARDIOGRAM REPORT    Patient Name:   Tina George Date of Exam: 10/12/2020 Medical Rec #:  [id number]      Height:       66.0 in Accession #:    [id number]     Weight:       146.6 lb Date of Birth:  [birth year]      BSA:          1.752 m Patient Age:    74 years       BP:           118/64 mmHg Patient Gender: F              HR:           81 bpm. Exam Location:  [place]  Procedure: 2D Echo  Indications:    Murmur [R01.1 (ICD-10-CM)]  History:        Patient has no prior history of Echocardiogram examinations. COPD and Pain in both lower extremities, Signs/Symptoms:Chest Pain; Risk Factors:Hypertension.  Sonographer:    Louie Boston Referring Phys: [id number] KARDIE TOBB  IMPRESSIONS   1. Left ventricular ejection fraction, by estimation, is 60 to 65%. The left ventricle has normal function. The left ventricle has no regional wall motion abnormalities. Left ventricular diastolic parameters are consistent with Grade I diastolic dysfunction (impaired relaxation). 2. Right ventricular systolic function is normal. The right ventricular size is normal. There is normal  pulmonary artery systolic pressure. 3. The mitral valve is normal in structure. Mild mitral valve regurgitation. No evidence of mitral stenosis. 4. The aortic valve is normal in structure. Aortic valve regurgitation is not visualized. No aortic stenosis is present. 5. The inferior vena cava is normal in size with greater than 50% respiratory variability, suggesting right atrial pressure of 3 mmHg.  FINDINGS Left Ventricle: Left ventricular ejection fraction, by estimation, is 60 to 65%. The left ventricle has normal function. The left ventricle has no regional wall motion abnormalities. The left ventricular internal cavity size was normal in size. There is no left  ventricular hypertrophy. Left ventricular diastolic parameters are consistent with Grade I diastolic dysfunction (impaired relaxation).  Right Ventricle: The right ventricular size is normal. No increase in right ventricular wall thickness. Right ventricular systolic function is normal. There is normal pulmonary artery systolic pressure. The tricuspid regurgitant velocity is 1.28 m/s, and with an assumed right atrial pressure of 3 mmHg, the estimated right ventricular systolic pressure is 9.6 mmHg.  Left Atrium: Left atrial size was normal in size.  Right Atrium: Right atrial size was normal in size.  Pericardium: There is no evidence of pericardial effusion.  Mitral Valve: The mitral valve is normal in structure. Mild mitral valve regurgitation. No evidence of mitral valve stenosis.  Tricuspid Valve: The tricuspid valve is normal in structure. Tricuspid valve regurgitation is mild . No evidence of tricuspid stenosis.  Aortic Valve: The aortic valve is normal in structure. Aortic valve regurgitation is not visualized. No aortic stenosis is present.  Pulmonic Valve: The pulmonic valve was normal in structure. Pulmonic valve regurgitation is not visualized. No evidence of pulmonic stenosis.  Aorta: The aortic root is normal in size and structure.  Venous: The inferior vena cava is normal in size with greater than 50% respiratory variability, suggesting right atrial pressure of 3 mmHg.  IAS/Shunts: No atrial level shunt detected by color flow Doppler.   LEFT VENTRICLE PLAX 2D LVIDd:         3.70 cm     Diastology LVIDs:         2.00 cm     LV e' medial:    6.53 cm/s LV PW:         1.20 cm     LV E/e' medial:  11.7 LV IVS:        1.20 cm     LV e' lateral:   5.77 cm/s LVOT diam:     1.70 cm     LV E/e' lateral: 13.2 LV SV:         43 LV SV Index:   24 LVOT Area:     2.27 cm  LV Volumes (MOD) LV vol d, MOD A2C: 30.9 ml LV vol d, MOD A4C: 34.8 ml LV vol s, MOD A2C: 14.7 ml LV vol  s, MOD A4C: 18.3 ml LV SV MOD A2C:     16.2 ml LV SV MOD A4C:     34.8 ml LV SV MOD BP:      16.5 ml  RIGHT VENTRICLE             IVC RV S prime:     10.60 cm/s  IVC diam: 1.30 cm TAPSE (M-mode): 1.8 cm  LEFT ATRIUM             Index       RIGHT ATRIUM          Index LA diam:  2.20 cm 1.26 cm/m  RA Area:     8.27 cm LA Vol (A2C):   22.6 ml 12.90 ml/m RA Volume:   14.50 ml 8.27 ml/m LA Vol (A4C):   28.2 ml 16.09 ml/m LA Biplane Vol: 25.6 ml 14.61 ml/m AORTIC VALVE LVOT Vmax:   101.00 cm/s LVOT Vmean:  67.000 cm/s LVOT VTI:    0.188 m  AORTA Ao Root diam: 2.30 cm Ao Asc diam:  3.20 cm Ao Desc diam: 1.80 cm  MITRAL VALVE               TRICUSPID VALVE MV Area (PHT): 3.03 cm    TR Peak grad:   6.6 mmHg MV Decel Time: 250 msec    TR Vmax:        128.00 cm/s MV E velocity: 76.20 cm/s MV A velocity: 90.70 cm/s  SHUNTS MV E/A ratio:  0.84        Systemic VTI:  0.19 m Systemic Diam: 1.70 cm  Belva Crome MD Electronically signed by Belva Crome MD Signature Date/Time: 10/12/2020/3:43:36 PM    Final             Risk Assessment/Calculations:             Physical Exam:   VS:  BP 120/70 (BP Location: Right Arm, Patient Position: Sitting, Cuff Size: Normal)   Pulse 97   Ht 5\' 7"  (1.702 m)   Wt 150 lb 12.8 oz (68.4 kg)   SpO2 95%   BMI 23.62 kg/m    Wt Readings from Last 3 Encounters:  04/22/23 150 lb 12.8 oz (68.4 kg)  12/29/20 150 lb 3.2 oz (68.1 kg)  12/15/20 151 lb 6.4 oz (68.7 kg)    GEN: anxious appearing NECK: No JVD; No carotid bruits CARDIAC: RRR, no murmurs, rubs, gallops RESPIRATORY:  Clear to auscultation without rales, wheezing or rhonchi  ABDOMEN: Soft, non-tender, non-distended EXTREMITIES:  +2 pitting edema mid tibia; legs are discolored; RLL non-healing wound, +1 DP bilaterally   ASSESSMENT AND PLAN: .   Precordial pain-describes as a squeezing sensation that can occur at rest or with exertion, likely an anginal equivalent.  Will  arrange for coronary CTA.  Continue aspirin 81 mg daily, will arrange for nitroglycerin as needed--discussed indications of when she should use this and when she should proceed to the emergency department.  Peripheral artery disease/pedal edema-legs are edematous, discolored, nonhealing wound on RLL PE.  Previous ABIs did not reveal significant arterial disease.  Will repeat these.  I suspect she will need a referral to the [facility] regardless of the outcome of her ABIs, so we will go ahead and arrange this.  She does have pulses but they are faint.  She states her feet have been discolored for many years.  Her PCP put her on Lasix 40 mg twice daily, this has helped somewhat, will repeat BMET.  Dyslipidemia-most recent LDL was elevated at 139 on 04/10/2023  COPD-was previously evaluated by Dr. Francine Graven however she has not seen him in some time.  She does state her breathing has become worse over the last 2 years.  Will rerefer her back to his office.  Smoker-previously smoked quite heavily, she is trying to cut down smoking approximately 2 to 3 cigarettes/day, encourage complete cessation.    Hypertension-blood pressure is well-controlled at 120/70, continue amlodipine 5 mg daily, continue spironolactone 50 mg daily.    Dispo: Coronary CTA, nitroglycerin as needed. CBC, CMET.  Refer to vascular.  Refer to pulmonology.  Signed, Flossie Dibble, NP

## 2023-04-23 ENCOUNTER — Telehealth: Payer: Self-pay | Admitting: *Deleted

## 2023-04-23 LAB — CBC WITH DIFFERENTIAL/PLATELET
Basophils Absolute: 0 x10E3/uL (ref 0.0–0.2)
Basos: 0 %
EOS (ABSOLUTE): 0.1 x10E3/uL (ref 0.0–0.4)
Eos: 1 %
Hematocrit: 40 % (ref 34.0–46.6)
Hemoglobin: 13.2 g/dL (ref 11.1–15.9)
Immature Grans (Abs): 0.1 x10E3/uL (ref 0.0–0.1)
Immature Granulocytes: 1 %
Lymphocytes Absolute: 1.4 x10E3/uL (ref 0.7–3.1)
Lymphs: 14 %
MCH: 31.7 pg (ref 26.6–33.0)
MCHC: 33 g/dL (ref 31.5–35.7)
MCV: 96 fL (ref 79–97)
Monocytes Absolute: 0.9 x10E3/uL (ref 0.1–0.9)
Monocytes: 10 %
Neutrophils Absolute: 6.9 x10E3/uL (ref 1.4–7.0)
Neutrophils: 74 %
Platelets: 224 x10E3/uL (ref 150–450)
RBC: 4.17 x10E6/uL (ref 3.77–5.28)
RDW: 13.4 % (ref 11.7–15.4)
WBC: 9.4 x10E3/uL (ref 3.4–10.8)

## 2023-04-23 LAB — COMPREHENSIVE METABOLIC PANEL WITH GFR
ALT: 9 IU/L (ref 0–32)
AST: 13 IU/L (ref 0–40)
Albumin: 3.7 g/dL — ABNORMAL LOW (ref 3.8–4.8)
Alkaline Phosphatase: 66 IU/L (ref 44–121)
BUN/Creatinine Ratio: 12 (ref 12–28)
BUN: 15 mg/dL (ref 8–27)
Bilirubin Total: 0.5 mg/dL (ref 0.0–1.2)
CO2: 21 mmol/L (ref 20–29)
Calcium: 9.4 mg/dL (ref 8.7–10.3)
Chloride: 108 mmol/L — ABNORMAL HIGH (ref 96–106)
Creatinine, Ser: 1.23 mg/dL — ABNORMAL HIGH (ref 0.57–1.00)
Globulin, Total: 2.2 g/dL (ref 1.5–4.5)
Glucose: 75 mg/dL (ref 70–99)
Potassium: 4.6 mmol/L (ref 3.5–5.2)
Sodium: 143 mmol/L (ref 134–144)
Total Protein: 5.9 g/dL — ABNORMAL LOW (ref 6.0–8.5)
eGFR: 46 mL/min/1.73 — ABNORMAL LOW

## 2023-04-23 LAB — PRO B NATRIURETIC PEPTIDE: NT-Pro BNP: 1074 pg/mL — ABNORMAL HIGH (ref 0–738)

## 2023-04-23 MED ORDER — TORSEMIDE 20 MG PO TABS: 20.0000 mg | ORAL_TABLET | Freq: Two times a day (BID) | ORAL | 3 refills | Status: DC

## 2023-04-23 NOTE — Telephone Encounter (Signed)
-----   Message from Flossie Dibble, NP sent at 04/23/2023 10:38 AM EDT ----- Lab work is stable.  Try to increase your protein intake. Kidney dysfunction but is stable.  Lets stop the lasix, see if we can get better response with torsemide 20 mg twice daily.

## 2023-04-23 NOTE — Telephone Encounter (Signed)
Informed pt of lab results and sent Torsemide 20 mg to pharmacy. Pt asked about Cardiac CT, they haven't called her yet. Let her know it could take up to 2 weeks but if longer than that give them a call.

## 2023-05-21 ENCOUNTER — Ambulatory Visit: Payer: Medicare Other | Attending: Cardiology

## 2023-05-21 ENCOUNTER — Other Ambulatory Visit: Payer: Self-pay

## 2023-05-21 ENCOUNTER — Other Ambulatory Visit: Payer: Self-pay | Admitting: Cardiology

## 2023-05-21 DIAGNOSIS — M79604 Pain in right leg: Secondary | ICD-10-CM | POA: Diagnosis present

## 2023-05-21 DIAGNOSIS — I739 Peripheral vascular disease, unspecified: Secondary | ICD-10-CM

## 2023-05-21 DIAGNOSIS — M79605 Pain in left leg: Secondary | ICD-10-CM

## 2023-05-21 LAB — VAS US ABI WITH/WO TBI
Left ABI: 0.58
Right ABI: 0.72

## 2023-05-23 NOTE — Progress Notes (Unsigned)
Cardiology Office Note:  .   Date:  05/23/2023  ID:  Tina George, DOB 10/16/1945, MRN [id number] PCP: Simone Curia, MD  [facility] Providers Cardiologist:  None    History of Present Illness: .   Tina George is a 77 y.o. female with a past medical history of dyslipidemia, hypertension, depression, smoker, family history of premature coronary artery disease, benign essential tremor, CKD stage IIIb, neuropathy.   Last evaluated by Dr. Servando Salina in March 2022 for shortness of breath, she recently had stress evaluation which was normal, she was advised to follow-up with pulmonary for shortness of breath.  Echo on 04/30/2022 revealed an EF 60 to 65%, impaired relaxation, no valvular abnormalities. 11/15/2020 Lexiscan low risk, normal study. 10/12/2020 ABI normal  She presents today for follow-up at the behest of her PCP after having not been seen since 2022.  She states she does not feel well, she has multiple complaints.  She is short of breath, has pedal edema, likely combination of venous insufficiency and peripheral artery disease, she also endorses a squeezing sensation at times in her chest. She denies palpitations, pnd, orthopnea, n, v, dizziness, syncope, weight gain, or early satiety.   ROS: Review of Systems  Constitutional:  Positive for malaise/fatigue.  HENT: Negative.    Respiratory:  Positive for shortness of breath.   Cardiovascular:  Positive for palpitations and leg swelling.  Gastrointestinal: Negative.   Genitourinary: Negative.   Musculoskeletal: Negative.   Neurological: Negative.   Endo/Heme/Allergies:  Bruises/bleeds easily.  Psychiatric/Behavioral: Negative.       Studies Reviewed: .        Cardiac Studies & Procedures     STRESS TESTS  MYOCARDIAL PERFUSION IMAGING 11/15/2020  Narrative  The left ventricular ejection fraction is hyperdynamic (>65%).  Nuclear stress EF: 70%.  There was no ST segment deviation noted during stress.  No T wave  inversion was noted during stress.  The study is normal.  This is a low risk study.   ECHOCARDIOGRAM  ECHOCARDIOGRAM COMPLETE 10/12/2020  Narrative ECHOCARDIOGRAM REPORT    Patient Name:   Tina George Date of Exam: 10/12/2020 Medical Rec #:  [id number]      Height:       66.0 in Accession #:    [id number]     Weight:       146.6 lb Date of Birth:  Nov 14, 1945      BSA:          1.752 m Patient Age:    74 years       BP:           118/64 mmHg Patient Gender: F              HR:           81 bpm. Exam Location:  [place]  Procedure: 2D Echo  Indications:    Murmur [R01.1 (ICD-10-CM)]  History:        Patient has no prior history of Echocardiogram examinations. COPD and Pain in both lower extremities, Signs/Symptoms:Chest Pain; Risk Factors:Hypertension.  Sonographer:    Louie Boston Referring Phys: [id number] KARDIE TOBB  IMPRESSIONS   1. Left ventricular ejection fraction, by estimation, is 60 to 65%. The left ventricle has normal function. The left ventricle has no regional wall motion abnormalities. Left ventricular diastolic parameters are consistent with Grade I diastolic dysfunction (impaired relaxation). 2. Right ventricular systolic function is normal. The right ventricular size is normal. There is normal pulmonary artery  systolic pressure. 3. The mitral valve is normal in structure. Mild mitral valve regurgitation. No evidence of mitral stenosis. 4. The aortic valve is normal in structure. Aortic valve regurgitation is not visualized. No aortic stenosis is present. 5. The inferior vena cava is normal in size with greater than 50% respiratory variability, suggesting right atrial pressure of 3 mmHg.  FINDINGS Left Ventricle: Left ventricular ejection fraction, by estimation, is 60 to 65%. The left ventricle has normal function. The left ventricle has no regional wall motion abnormalities. The left ventricular internal cavity size was normal in size. There is no left  ventricular hypertrophy. Left ventricular diastolic parameters are consistent with Grade I diastolic dysfunction (impaired relaxation).  Right Ventricle: The right ventricular size is normal. No increase in right ventricular wall thickness. Right ventricular systolic function is normal. There is normal pulmonary artery systolic pressure. The tricuspid regurgitant velocity is 1.28 m/s, and with an assumed right atrial pressure of 3 mmHg, the estimated right ventricular systolic pressure is 9.6 mmHg.  Left Atrium: Left atrial size was normal in size.  Right Atrium: Right atrial size was normal in size.  Pericardium: There is no evidence of pericardial effusion.  Mitral Valve: The mitral valve is normal in structure. Mild mitral valve regurgitation. No evidence of mitral valve stenosis.  Tricuspid Valve: The tricuspid valve is normal in structure. Tricuspid valve regurgitation is mild . No evidence of tricuspid stenosis.  Aortic Valve: The aortic valve is normal in structure. Aortic valve regurgitation is not visualized. No aortic stenosis is present.  Pulmonic Valve: The pulmonic valve was normal in structure. Pulmonic valve regurgitation is not visualized. No evidence of pulmonic stenosis.  Aorta: The aortic root is normal in size and structure.  Venous: The inferior vena cava is normal in size with greater than 50% respiratory variability, suggesting right atrial pressure of 3 mmHg.  IAS/Shunts: No atrial level shunt detected by color flow Doppler.   LEFT VENTRICLE PLAX 2D LVIDd:         3.70 cm     Diastology LVIDs:         2.00 cm     LV e' medial:    6.53 cm/s LV PW:         1.20 cm     LV E/e' medial:  11.7 LV IVS:        1.20 cm     LV e' lateral:   5.77 cm/s LVOT diam:     1.70 cm     LV E/e' lateral: 13.2 LV SV:         43 LV SV Index:   24 LVOT Area:     2.27 cm  LV Volumes (MOD) LV vol d, MOD A2C: 30.9 ml LV vol d, MOD A4C: 34.8 ml LV vol s, MOD A2C: 14.7 ml LV vol  s, MOD A4C: 18.3 ml LV SV MOD A2C:     16.2 ml LV SV MOD A4C:     34.8 ml LV SV MOD BP:      16.5 ml  RIGHT VENTRICLE             IVC RV S prime:     10.60 cm/s  IVC diam: 1.30 cm TAPSE (M-mode): 1.8 cm  LEFT ATRIUM             Index       RIGHT ATRIUM          Index LA diam:  2.20 cm 1.26 cm/m  RA Area:     8.27 cm LA Vol (A2C):   22.6 ml 12.90 ml/m RA Volume:   14.50 ml 8.27 ml/m LA Vol (A4C):   28.2 ml 16.09 ml/m LA Biplane Vol: 25.6 ml 14.61 ml/m AORTIC VALVE LVOT Vmax:   101.00 cm/s LVOT Vmean:  67.000 cm/s LVOT VTI:    0.188 m  AORTA Ao Root diam: 2.30 cm Ao Asc diam:  3.20 cm Ao Desc diam: 1.80 cm  MITRAL VALVE               TRICUSPID VALVE MV Area (PHT): 3.03 cm    TR Peak grad:   6.6 mmHg MV Decel Time: 250 msec    TR Vmax:        128.00 cm/s MV E velocity: 76.20 cm/s MV A velocity: 90.70 cm/s  SHUNTS MV E/A ratio:  0.84        Systemic VTI:  0.19 m Systemic Diam: 1.70 cm  Belva Crome MD Electronically signed by Belva Crome MD Signature Date/Time: 10/12/2020/3:43:36 PM    Final             Risk Assessment/Calculations:     No BP recorded.  {Refresh Note OR Click here to enter BP  :1}***       Physical Exam:   VS:  There were no vitals taken for this visit.   Wt Readings from Last 3 Encounters:  04/22/23 150 lb 12.8 oz (68.4 kg)  12/29/20 150 lb 3.2 oz (68.1 kg)  12/15/20 151 lb 6.4 oz (68.7 kg)    GEN: anxious appearing NECK: No JVD; No carotid bruits CARDIAC: RRR, no murmurs, rubs, gallops RESPIRATORY:  Clear to auscultation without rales, wheezing or rhonchi  ABDOMEN: Soft, non-tender, non-distended EXTREMITIES:  +2 pitting edema mid tibia; legs are discolored; RLL non-healing wound, +1 DP bilaterally   ASSESSMENT AND PLAN: .   Precordial pain-describes as a squeezing sensation that can occur at rest or with exertion, likely an anginal equivalent.  Will arrange for coronary CTA.  Continue aspirin 81 mg daily, will arrange for  nitroglycerin as needed--discussed indications of when she should use this and when she should proceed to the emergency department.  Peripheral artery disease/pedal edema-legs are edematous, discolored, nonhealing wound on RLL PE.  Previous ABIs did not reveal significant arterial disease.  Will repeat these.  I suspect she will need a referral to the [facility] regardless of the outcome of her ABIs, so we will go ahead and arrange this.  She does have pulses but they are faint.  She states her feet have been discolored for many years.  Her PCP put her on Lasix 40 mg twice daily, this has helped somewhat, will repeat BMET.  Dyslipidemia-most recent LDL was elevated at 139 on 04/10/2023  COPD-was previously evaluated by Dr. Francine Graven however she has not seen him in some time.  She does state her breathing has become worse over the last 2 years.  Will rerefer her back to his office.  Smoker-previously smoked quite heavily, she is trying to cut down smoking approximately 2 to 3 cigarettes/day, encourage complete cessation.    Hypertension-blood pressure is well-controlled at 120/70, continue amlodipine 5 mg daily, continue spironolactone 50 mg daily.    Dispo: Coronary CTA, nitroglycerin as needed. CBC, CMET.  Refer to vascular.  Refer to pulmonology.   Signed, Flossie Dibble, NP

## 2023-05-26 ENCOUNTER — Encounter: Payer: Self-pay | Admitting: Cardiology

## 2023-05-26 ENCOUNTER — Ambulatory Visit: Payer: Medicare Other | Attending: Cardiology | Admitting: Cardiology

## 2023-05-26 VITALS — BP 138/62 | HR 58 | Ht 67.0 in | Wt 147.6 lb

## 2023-05-26 DIAGNOSIS — R0602 Shortness of breath: Secondary | ICD-10-CM | POA: Diagnosis present

## 2023-05-26 NOTE — Patient Instructions (Signed)
Medication Instructions:  Your physician recommends that you continue on your current medications as directed. Please refer to the Current Medication list given to you today.  *If you need a refill on your cardiac medications before your next appointment, please call your pharmacy*   Lab Work: Your physician recommends that you have a BMP and ProBNP today in the office.  If you have labs (blood work) drawn today and your tests are completely normal, you will receive your results only by: MyChart Message (if you have MyChart) OR A paper copy in the mail If you have any lab test that is abnormal or we need to change your treatment, we will call you to review the results.   Testing/Procedures: None ordered   Follow-Up: At [facility], you and your health needs are our priority.  As part of our continuing mission to provide you with exceptional heart care, we have created designated Provider Care Teams.  These Care Teams include your primary Cardiologist (physician) and Advanced Practice Providers (APPs -  Physician Assistants and Nurse Practitioners) who all work together to provide you with the care you need, when you need it.  We recommend signing up for the patient portal called "MyChart".  Sign up information is provided on this After Visit Summary.  MyChart is used to connect with patients for Virtual Visits (Telemedicine).  Patients are able to view lab/test results, encounter notes, upcoming appointments, etc.  Non-urgent messages can be sent to your provider as well.   To learn more about what you can do with MyChart, go to [email or web address].    Your next appointment:   3 month(s)  The format for your next appointment:   In Person  Provider:   Wallis Bamberg, NP Rosalita Levan)    Other Instructions none  Important Information About Sugar

## 2023-05-27 ENCOUNTER — Telehealth: Payer: Self-pay | Admitting: *Deleted

## 2023-05-27 LAB — PRO B NATRIURETIC PEPTIDE: NT-Pro BNP: 847 pg/mL — ABNORMAL HIGH (ref 0–738)

## 2023-05-27 LAB — BASIC METABOLIC PANEL WITH GFR
BUN/Creatinine Ratio: 14 (ref 12–28)
BUN: 18 mg/dL (ref 8–27)
CO2: 25 mmol/L (ref 20–29)
Calcium: 9.3 mg/dL (ref 8.7–10.3)
Chloride: 107 mmol/L — ABNORMAL HIGH (ref 96–106)
Creatinine, Ser: 1.31 mg/dL — ABNORMAL HIGH (ref 0.57–1.00)
Glucose: 73 mg/dL (ref 70–99)
Potassium: 4.3 mmol/L (ref 3.5–5.2)
Sodium: 143 mmol/L (ref 134–144)
eGFR: 42 mL/min/1.73 — ABNORMAL LOW

## 2023-05-27 NOTE — Telephone Encounter (Signed)
Left message for pt to call back for lab results.

## 2023-05-27 NOTE — Telephone Encounter (Signed)
-----   Message from Flossie Dibble sent at 05/27/2023  8:13 AM EDT ----- Labs look good after medication changes.  Continue with current medications.

## 2023-05-28 ENCOUNTER — Telehealth (HOSPITAL_COMMUNITY): Payer: Self-pay | Admitting: *Deleted

## 2023-05-28 NOTE — Telephone Encounter (Signed)
Reaching out to patient to offer assistance regarding upcoming cardiac imaging study; pt verbalizes understanding of appt date/time, parking situation and where to check in, pre-test NPO status and medications ordered, and verified current allergies; name and call back number provided for further questions should they arise Hayley Sharpe RN Navigator Cardiac [facility] [phone] office [phone] cell  

## 2023-05-29 ENCOUNTER — Other Ambulatory Visit: Payer: Self-pay | Admitting: Cardiology

## 2023-05-29 ENCOUNTER — Ambulatory Visit (HOSPITAL_BASED_OUTPATIENT_CLINIC_OR_DEPARTMENT_OTHER)
Admission: RE | Admit: 2023-05-29 | Discharge: 2023-05-29 | Disposition: A | Discharge status: Home / Self Care | Payer: Medicare Other | Source: Ambulatory Visit | Attending: Cardiology | Admitting: Cardiology

## 2023-05-29 ENCOUNTER — Ambulatory Visit (HOSPITAL_COMMUNITY)
Admission: RE | Admit: 2023-05-29 | Discharge: 2023-05-29 | Disposition: A | Discharge status: Home / Self Care | Payer: Medicare Other | Source: Ambulatory Visit | Attending: Cardiology | Admitting: Cardiology

## 2023-05-29 DIAGNOSIS — R072 Precordial pain: Secondary | ICD-10-CM | POA: Insufficient documentation

## 2023-05-29 DIAGNOSIS — R931 Abnormal findings on diagnostic imaging of heart and coronary circulation: Secondary | ICD-10-CM | POA: Insufficient documentation

## 2023-05-29 DIAGNOSIS — I2510 Atherosclerotic heart disease of native coronary artery without angina pectoris: Secondary | ICD-10-CM

## 2023-05-29 MED ORDER — IOHEXOL 350 MG/ML SOLN
95.0000 mL | Freq: Once | INTRAVENOUS | Status: AC | PRN
  Administered 2023-05-29: 95 mL via INTRAVENOUS

## 2023-05-29 MED ORDER — NITROGLYCERIN 0.4 MG SL SUBL
0.8000 mg | SUBLINGUAL_TABLET | Freq: Once | SUBLINGUAL | Status: AC
  Administered 2023-05-29: 0.8 mg via SUBLINGUAL

## 2023-05-29 MED ORDER — NITROGLYCERIN 0.4 MG SL SUBL
SUBLINGUAL_TABLET | SUBLINGUAL | Status: AC
  Filled 2023-05-29: qty 2

## 2023-05-30 ENCOUNTER — Telehealth: Payer: Self-pay | Admitting: *Deleted

## 2023-05-30 MED ORDER — NITROGLYCERIN 0.4 MG SL SUBL: 0.4000 mg | SUBLINGUAL_TABLET | SUBLINGUAL | 5 refills | Status: AC | PRN

## 2023-05-30 MED ORDER — ISOSORBIDE MONONITRATE ER 30 MG PO TB24: 30.0000 mg | ORAL_TABLET | Freq: Every day | ORAL | 3 refills | Status: DC

## 2023-05-30 MED ORDER — ROSUVASTATIN CALCIUM 20 MG PO TABS: 20.0000 mg | ORAL_TABLET | Freq: Every day | ORAL | 3 refills | Status: DC

## 2023-05-30 NOTE — Telephone Encounter (Signed)
-----   Message from Flossie Dibble sent at 05/29/2023  7:49 PM EDT ----- Coronary CTA revealed that you do have coronary artery disease, best treated with medical therapy for now.   I see that ASA is an allergy, can we see what her reaction was to it?  Start Imdur 30 mg daily, this will help with the squeezing sensation she felt on occasion.  NTG PRN for chest pain.  Needs to start statin, Crestor 20 mg daily. Repeat FLP and LFTs in 6 weeks.  Lets have her return to see me or Dr. Vincent Gros (former [facility] pt) in a month.

## 2023-06-02 NOTE — Progress Notes (Unsigned)
[facility] [place]  ASSESSMENT / PLAN: Tina George is a 77 y.o. 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  Patient counseled patients with asymptomatic peripheral arterial disease or claudication have a 1-2% risk of developing chronic limb threatening ischemia, but a 15-30% risk of mortality in the next 5 years. Intervention should only be considered for medically optimized patients with disabling symptoms.   Recommend:  Abstinence from all tobacco products. Blood glucose control with goal A1c < 7%. Blood pressure control with goal blood pressure < 140/90 mmHg. Lipid reduction therapy with goal LDL-C <100 mg/dL  Aspirin 81mg  PO QD.  Atorvastatin 40-80mg  PO QD (or other "high intensity" statin therapy). Daily walking to and past the point of discomfort. Patient counseled to keep a log of exercise distance.  Patient strongly desires intervention.  I counseled her to try medical therapy first.  I will see her back in 1 to 3 months to evaluate her results with a walking regimen.  CHIEF COMPLAINT: Lower extremity pain  HISTORY OF PRESENT ILLNESS: Tina George is a 77 y.o. female referred to clinic for wound of the right lower extremity in the setting of swelling.  Patient reports this wound has healed.  She is significantly bothered by swelling in her bilateral lower extremities.  She also reports significant discomfort with walking.  She reports cramping pain in her calves that begins prior to getting to her mailbox.  The pain is relieved by rest.  She does not describe constant, severe pain in her feet.  The ulcer for which she was referred has healed, thankfully.  She has no new ulcers about her feet.  Past Medical History:  Diagnosis Date    Anemia 09/20/2014   Asthma 05/06/2014   Bilateral leg edema 09/15/2020   COPD (chronic obstructive pulmonary disease) (HCC)    Daytime somnolence 09/15/2020   Depression    Dyslipidemia    GERD (gastroesophageal reflux disease)    GERD (gastroesophageal reflux disease)    High blood pressure    Hypercholesterolemia 09/20/2014   Hypokalemia    Medication management 09/15/2020   Murmur 09/15/2020   Muscle cramps    Osteoarthritis 09/20/2014   Other chest pain 09/15/2020   Pain in both lower extremities 09/15/2020   Precordial pain 09/15/2020   Psoriasis    Snoring 09/15/2020   SOB (shortness of breath) 09/15/2020   Vitamin D deficiency 09/20/2014    Past Surgical History:  Procedure Laterality Date   ABDOMINAL HYSTERECTOMY     APPENDECTOMY     arthroscopic shoulder surgery     BACK SURGERY     CATARACT EXTRACTION, BILATERAL     CHOLECYSTECTOMY     MASTECTOMY     MASTOIDECTOMY     PARTIAL COLECTOMY      Family History  Problem Relation Age of Onset   Pancreatic cancer Mother    Hypertension Father    Heart disease Father    Pancreatic cancer Sister    Pancreatic cancer Brother     Social History   Socioeconomic History   Marital status: Widowed    Spouse name: Not on file   Number of children: 2   Years of education: Not on file   Highest education level: Not on file  Occupational History   Not on file  Tobacco  Use   Smoking status: Every Day    Current packs/day: 0.50    Types: Cigarettes   Smokeless tobacco: Never  Substance and Sexual Activity   Alcohol use: Never   Drug use: Never   Sexual activity: Not on file  Other Topics Concern   Not on file  Social History Narrative   Not on file   Social Determinants of Health   Financial Resource Strain: Not on file  Food Insecurity: Not on file  Transportation Needs: Not on file  Physical Activity: Not on file  Stress: Not on file  Social Connections: Not on file  Intimate Partner Violence:  Not on file    Allergies  Allergen Reactions   Aspirin    Codeine    Levaquin [Levofloxacin In D5w]     Current Outpatient Medications  Medication Sig Dispense Refill   albuterol (PROVENTIL HFA;VENTOLIN HFA) 108 (90 Base) MCG/ACT inhaler Inhale 1-2 puffs into the lungs as needed for wheezing or shortness of breath.     ALPRAZolam (XANAX) 0.25 MG tablet Take 0.25 mg by mouth every 6 (six) hours as needed for anxiety.     amLODipine (NORVASC) 5 MG tablet Take 5 mg by mouth daily.     Budeson-Glycopyrrol-Formoterol (BREZTRI AEROSPHERE) 160-9-4.8 MCG/ACT AERO Inhale 2 puffs into the lungs in the morning and at bedtime. 42.8 g 0   diltiazem (CARDIZEM CD) 120 MG 24 hr capsule Take 1 capsule (120 mg total) by mouth daily. 90 capsule 3   gabapentin (NEURONTIN) 300 MG capsule Take 300 mg by mouth at bedtime.     hydrochlorothiazide (HYDRODIURIL) 25 MG tablet Take 25 mg by mouth daily.     ibuprofen (ADVIL,MOTRIN) 200 MG tablet Take 200 mg by mouth as needed for pain.     ipratropium-albuterol (DUONEB) 0.5-2.5 (3) MG/3ML SOLN Take 3 mLs by nebulization every 4 (four) hours as needed for wheezing or shortness of breath.     isosorbide mononitrate (IMDUR) 30 MG 24 hr tablet Take 1 tablet (30 mg total) by mouth daily. 90 tablet 3   nitroGLYCERIN (NITROSTAT) 0.4 MG SL tablet Place 1 tablet (0.4 mg total) under the tongue every 5 (five) minutes as needed for chest pain. 25 tablet 5   pantoprazole (PROTONIX) 40 MG tablet Take 1 tablet (40 mg total) by mouth 2 (two) times daily. 60 tablet 11   PARoxetine (PAXIL) 40 MG tablet Take 40 mg by mouth every morning.     rOPINIRole (REQUIP) 3 MG tablet Take 3 mg by mouth at bedtime.     rosuvastatin (CRESTOR) 20 MG tablet Take 1 tablet (20 mg total) by mouth daily. 90 tablet 3   spironolactone (ALDACTONE) 50 MG tablet Take 50 mg by mouth daily.     torsemide (DEMADEX) 20 MG tablet Take 1 tablet (20 mg total) by mouth 2 (two) times daily. 60 tablet 3   No  current facility-administered medications for this visit.    PHYSICAL EXAM Vitals:   06/03/23 1401  BP: 131/70  Pulse: (!) 46  Resp: 20  Temp: 97.9 F (36.6 C)  SpO2: 97%  Weight: 151 lb (68.5 kg)  Height: 5\' 7"  (1.702 m)    Elderly woman in no distress Regular rate and rhythm Unlabored breathing No palpable pedal pulses 2+ edema about the calves, ankles, and proximal feet typical of chronic venous insufficiency  PERTINENT LABORATORY AND RADIOLOGIC DATA  Most recent CBC    Latest Ref Rng & Units 04/22/2023    2:45 PM  10/27/2020    3:13 PM  CBC  WBC 3.4 - 10.8 x10E3/uL 9.4  10.9   Hemoglobin 11.1 - 15.9 g/dL 69.6  29.5   Hematocrit 34.0 - 46.6 % 40.0  42.2   Platelets 150 - 450 x10E3/uL 224  274.0      Most recent CMP    Latest Ref Rng & Units 05/26/2023    1:07 PM 04/22/2023    2:45 PM 12/15/2020   12:07 PM  CMP  Glucose 70 - 99 mg/dL 73  75    BUN 8 - 27 mg/dL 18  15    Creatinine 2.84 - 1.00 mg/dL 1.32  4.40    Sodium 102 - 144 mmol/L 143  143    Potassium 3.5 - 5.2 mmol/L 4.3  4.6    Chloride 96 - 106 mmol/L 107  108    CO2 20 - 29 mmol/L 25  21    Calcium 8.7 - 10.3 mg/dL 9.3  9.4    Total Protein 6.0 - 8.5 g/dL  5.9  6.2   Total Bilirubin 0.0 - 1.2 mg/dL  0.5  0.7   Alkaline Phos 44 - 121 IU/L  66  95    77   AST 0 - 40 IU/L  13  12   ALT 0 - 32 IU/L  9  25     +-------+-----------+-----------+------------+------------+  ABI/TBIToday's ABIToday's TBIPrevious ABIPrevious TBI  +-------+-----------+-----------+------------+------------+  Right .72        .57                                  +-------+-----------+-----------+------------+------------+  Left  .58        .41                                  +-------+-----------+-----------+------------+------------+    Rande Brunt. Lenell Antu, MD FACS [facility] Phone Number: [phone] 06/03/2023 8:43 PM   Total time spent on preparing this  encounter including chart review, data review, collecting history, examining the patient, coordinating care for this new patient, 60 minutes.  Portions of this report may have been transcribed using voice recognition software.  Every effort has been made to ensure accuracy; however, inadvertent computerized transcription errors may still be present.

## 2023-06-02 NOTE — Telephone Encounter (Signed)
Informed pt of normal lab results. Pt verbalized understanding and had no further questions.

## 2023-06-03 ENCOUNTER — Ambulatory Visit (INDEPENDENT_AMBULATORY_CARE_PROVIDER_SITE_OTHER): Payer: Medicare Other | Admitting: Vascular Surgery

## 2023-06-03 ENCOUNTER — Encounter: Payer: Self-pay | Admitting: Vascular Surgery

## 2023-06-03 VITALS — BP 131/70 | HR 46 | Temp 97.9°F | Resp 20 | Ht 67.0 in | Wt 151.0 lb

## 2023-06-03 DIAGNOSIS — I70213 Atherosclerosis of native arteries of extremities with intermittent claudication, bilateral legs: Secondary | ICD-10-CM | POA: Diagnosis not present

## 2023-06-03 DIAGNOSIS — I872 Venous insufficiency (chronic) (peripheral): Secondary | ICD-10-CM

## 2023-06-03 DIAGNOSIS — M7989 Other specified soft tissue disorders: Secondary | ICD-10-CM

## 2023-06-04 ENCOUNTER — Ambulatory Visit: Payer: Medicare Other

## 2023-06-09 ENCOUNTER — Telehealth: Payer: Self-pay

## 2023-06-09 NOTE — Telephone Encounter (Signed)
Left vm to call back

## 2023-06-09 NOTE — Telephone Encounter (Signed)
-----   Message from Flossie Dibble sent at 06/08/2023  2:38 PM EDT ----- Ms. Satkowski, the coronary CTA is read first by our cardiologist (pretty quickly) and the second part is read by a radiologist (not so quickly). For the second part, there was a small nodule in your right lower lobe--recommend follow up in 6-12 months as a precaution--this should be arranged (if necessary) by your PCP). Will send results to your PCP.

## 2023-06-10 NOTE — Telephone Encounter (Signed)
Left message for pt to call us back. 

## 2023-06-11 ENCOUNTER — Telehealth: Payer: Self-pay | Admitting: Cardiology

## 2023-06-11 NOTE — Telephone Encounter (Signed)
Pt returning call for ct results  

## 2023-06-11 NOTE — Telephone Encounter (Signed)
Results reviewed with pt as per Jennifer Woody PA's note.  Pt verbalized understanding and had no additional questions. Routed to PCP.  

## 2023-06-23 ENCOUNTER — Ambulatory Visit: Payer: Medicare Other

## 2023-06-23 VITALS — BP 132/72 | HR 48 | Ht 67.0 in | Wt 158.2 lb

## 2023-06-23 DIAGNOSIS — R0602 Shortness of breath: Secondary | ICD-10-CM | POA: Insufficient documentation

## 2023-06-23 NOTE — Progress Notes (Signed)
Cardiology Consultation:    Date:  06/23/2023   ID:  Tina George, DOB 21-Aug-1946, MRN [id number]  PCP:  Tina Curia, MD  Cardiologist:  Tina Corporal Oshen Wlodarczyk, MD   Referring MD: Tina Curia, MD   No chief complaint on file. Follow-up on shortness of breath   ASSESSMENT AND PLAN:   Tina George 77 year old woman with history of moderate coronary atherosclerosis by CTA coronary angiogram 05/29/2023 and mildly abnormal CT FFR of distal LAD and OM1 suggested to pursue medical therapy, hypertension, hyperlipidemia, CKD stage III, smoking heavily until she recently quit a week and a half ago currently using nicotine patches, peripheral neuropathy, benign essential tremor, peripheral vascular disease with abnormal bilateral ankle-brachial indicis, asthma/COPD, presumed diastolic heart failure now presents for follow-up after recent cardiology visit on May 26, 2023.  Problem List Items Addressed This Visit     SOB (shortness of breath) - Primary    Significant functional limitation. Appears predominantly of pulmonary component with extensive inspiratory and expiratory wheezing and chronic history of heavy smoking.  She does have abnormal CT coronary angiogram with moderate coronary George disease however there is not significant ischemia burden on prior Lexiscan stress test in 2022 and CT FFR findings were only mild abnormal for distal LAD and OM disease.  Her symptoms appear out of proportion to the coronary George disease observed.  Continue aggressive medical therapy for the underlying Tina George disease.  Have her evaluated for pulmonary causes and optimize her COPD/asthma therapy. She has a visit with pulmonologist on 07-07-2023  Will keep close follow-up with her and have her come back for a visit in 1 month.  Advised her to continue taking aspirin 81 mg once daily and rosuvastatin 20 mg once daily.  Her medication regimen is unclear at this time; With duplication of calcium channel  blockers amlodipine and diltiazem.  Will discontinue diltiazem With her not being able to recognize spironolactone among her medications. Encouraged her to bring medications to the next visit so we can reconcile them appropriately.  If she can use to remain symptomatic despite pulmonary therapy, Will escalate antianginal therapy with addition of metoprolol and isosorbide mononitrate.  A competent of diastolic heart failure may also be playing a role with elevated BNP, diastolic dysfunction noted on prior echocardiograms and concern for pulmonary hypertension. Advised her to continue with low-sodium diet. Advised to increase torsemide to 40 mg in the morning and 20 mg in the afternoon for the next 1 week after which she will revert back.  Her current dose of 20 mg twice daily.        Return to clinic in 1 month.    History of Present Illness:    Tina George is a 77 y.o. female who is being seen today for follow-up visit.  PCP is Tina Curia, MD.  Last visit around August was May 26, 2023 with Wallis Bamberg.  Has history of hyperlipidemia, hypertension, depression, smoking, CKD stage III, neuropathy, benign essential tremor, family history of premature coronary George disease.  Peripheral vascular disease with abnormal bilateral ankle-brachial indicis. Right lower lobe lung nodule on recent CT coronary from 05/29/2023, recommended 6 to 47-month follow-up imaging.  She had echocardiogram in July 2023 that showed normal EF, impaired diastolic function, no significant valve abnormalities. Stress test with [facility] February 2022 was a low risk study without significant perfusion defects. After last office visit with Wallis Bamberg August 12 with atypical symptoms she underwent CTA coronary angiogram on 05/29/2023. Mildly abnormal  for CT FFR findings associated with distal LAD and OM1. CAD RADS 3 disease with coronary calcium score 2228 and moderate stenosis in LAD, aortic atherosclerosis  also noted.  Blood work from 05/26/2023 proBNP was elevated at 847 which is lower than thousand 74 couple months ago. Basic metabolic panel noted sodium 143, potassium 4.3, BUN 18, creatinine 1.31, AG 442.  Here for the visit today by herself.  Lives at home with her daughter. Mentions her functional status over the last many months has been gradually decreasing.  Her functional capacity now is limited to walking across the hallway in the room before she has to take a break and sit down.  He denies any significant symptoms at rest.  Since her last visit she has started taking torsemide  Past Medical History:  Diagnosis Date   Anemia 09/20/2014   Asthma 05/06/2014   Bilateral leg edema 09/15/2020   COPD (chronic obstructive pulmonary disease) (HCC)    Daytime somnolence 09/15/2020   Depression    Dyslipidemia    GERD (gastroesophageal reflux disease)    GERD (gastroesophageal reflux disease)    High blood pressure    Hypercholesterolemia 09/20/2014   Hypokalemia    Medication management 09/15/2020   Murmur 09/15/2020   Muscle cramps    Osteoarthritis 09/20/2014   Other chest pain 09/15/2020   Pain in both lower extremities 09/15/2020   Precordial pain 09/15/2020   Psoriasis    Snoring 09/15/2020   SOB (shortness of breath) 09/15/2020   Vitamin D deficiency 09/20/2014    Past Surgical History:  Procedure Laterality Date   ABDOMINAL HYSTERECTOMY     APPENDECTOMY     arthroscopic shoulder surgery     BACK SURGERY     CATARACT EXTRACTION, BILATERAL     CHOLECYSTECTOMY     MASTECTOMY     MASTOIDECTOMY     PARTIAL COLECTOMY      Current Medications: Current Meds  Medication Sig   albuterol (PROVENTIL HFA;VENTOLIN HFA) 108 (90 Base) MCG/ACT inhaler Inhale 1-2 puffs into the lungs as needed for wheezing or shortness of breath.   ALPRAZolam (XANAX) 0.25 MG tablet Take 0.25 mg by mouth every 6 (six) hours as needed for anxiety.   amLODipine (NORVASC) 5 MG tablet Take 5 mg  by mouth daily.   Budeson-Glycopyrrol-Formoterol (BREZTRI AEROSPHERE) 160-9-4.8 MCG/ACT AERO Inhale 2 puffs into the lungs in the morning and at bedtime.   gabapentin (NEURONTIN) 300 MG capsule Take 300 mg by mouth at bedtime.   hydrochlorothiazide (HYDRODIURIL) 25 MG tablet Take 25 mg by mouth daily.   ibuprofen (ADVIL,MOTRIN) 200 MG tablet Take 200 mg by mouth as needed for pain.   ipratropium-albuterol (DUONEB) 0.5-2.5 (3) MG/3ML SOLN Take 3 mLs by nebulization every 4 (four) hours as needed for wheezing or shortness of breath.   nitroGLYCERIN (NITROSTAT) 0.4 MG SL tablet Place 1 tablet (0.4 mg total) under the tongue every 5 (five) minutes as needed for chest pain.   pantoprazole (PROTONIX) 40 MG tablet Take 1 tablet (40 mg total) by mouth 2 (two) times daily.   PARoxetine (PAXIL) 40 MG tablet Take 40 mg by mouth every morning.   rOPINIRole (REQUIP) 3 MG tablet Take 3 mg by mouth at bedtime.   rosuvastatin (CRESTOR) 20 MG tablet Take 1 tablet (20 mg total) by mouth daily.   spironolactone (ALDACTONE) 50 MG tablet Take 50 mg by mouth daily.   torsemide (DEMADEX) 20 MG tablet Take 1 tablet (20 mg total) by mouth 2 (two)  times daily.   [DISCONTINUED] diltiazem (CARDIZEM CD) 120 MG 24 hr capsule Take 1 capsule (120 mg total) by mouth daily.     Allergies:   Aspirin, Codeine, and Levaquin [levofloxacin in d5w]   Social History   Socioeconomic History   Marital status: Widowed    Spouse name: Not on file   Number of children: 2   Years of education: Not on file   Highest education level: Not on file  Occupational History   Not on file  Tobacco Use   Smoking status: Every Day    Current packs/day: 0.50    Types: Cigarettes   Smokeless tobacco: Never  Substance and Sexual Activity   Alcohol use: Never   Drug use: Never   Sexual activity: Not on file  Other Topics Concern   Not on file  Social History Narrative   Not on file   Social Determinants of Health   Financial Resource  Strain: Not on file  Food Insecurity: Not on file  Transportation Needs: Not on file  Physical Activity: Not on file  Stress: Not on file  Social Connections: Not on file     Family History: The patient's family history includes Heart disease in her father; Hypertension in her father; Pancreatic cancer in her brother, mother, and sister. ROS:   Please see the history of present illness.    All 14 point review of systems negative except as described per history of present illness.  EKGs/Labs/Other Studies Reviewed:    The following studies were reviewed today:  Recent Labs: 04/22/2023: ALT 9; Hemoglobin 13.2; Platelets 224 05/26/2023: BUN 18; Creatinine, Ser 1.31; NT-Pro BNP 847; Potassium 4.3; Sodium 143  Recent Lipid Panel No results found for: "CHOL", "TRIG", "HDL", "CHOLHDL", "VLDL", "LDLCALC", "LDLDIRECT"  Physical Exam:    VS:  BP 132/72   Pulse (!) 48   Ht 5\' 7"  (1.702 m)   Wt 158 lb 3.2 oz (71.8 kg)   SpO2 98%   BMI 24.78 kg/m     Wt Readings from Last 3 Encounters:  06/23/23 158 lb 3.2 oz (71.8 kg)  06/03/23 151 lb (68.5 kg)  05/26/23 147 lb 9.6 oz (67 kg)     GENERAL:  Well nourished, well developed in no acute distress NECK: No JVD; No carotid bruits CARDIAC: RRR, S1 and S2 present, no murmurs, no rubs, no gallops CHEST: Extensive bilateral wheezing inspiratory and expiratory. Extremities: No pitting pedal edema. Pulses bilaterally symmetric with radial 2+ and dorsalis pedis 2+ NEUROLOGIC:  Alert and oriented x 3  Medication Adjustments/Labs and Tests Ordered: Current medicines are reviewed at length with the patient today.  Concerns regarding medicines are outlined above.  No orders of the defined types were placed in this encounter.  No orders of the defined types were placed in this encounter.   Signed, Cecille Amsterdam, MD, MPH, [facility]. 06/23/2023 3:11 PM    [facility]

## 2023-06-23 NOTE — Patient Instructions (Signed)
Medication Instructions:  Your physician has recommended you make the following change in your medication:   Increase your Torsemide to 40 mg in the morning and 20 mg in the evening for 1 week and then resume 20 mg twice daily.   *If you need a refill on your cardiac medications before your next appointment, please call your pharmacy*   Lab Work: None ordered If you have labs (blood work) drawn today and your tests are completely normal, you will receive your results only by: MyChart Message (if you have MyChart) OR A paper copy in the mail If you have any lab test that is abnormal or we need to change your treatment, we will call you to review the results.   Testing/Procedures: None ordered   Follow-Up: At [facility], you and your health needs are our priority.  As part of our continuing mission to provide you with exceptional heart care, we have created designated Provider Care Teams.  These Care Teams include your primary Cardiologist (physician) and Advanced Practice Providers (APPs -  Physician Assistants and Nurse Practitioners) who all work together to provide you with the care you need, when you need it.  We recommend signing up for the patient portal called "MyChart".  Sign up information is provided on this After Visit Summary.  MyChart is used to connect with patients for Virtual Visits (Telemedicine).  Patients are able to view lab/test results, encounter notes, upcoming appointments, etc.  Non-urgent messages can be sent to your provider as well.   To learn more about what you can do with MyChart, go to [email or web address].    Your next appointment:   1.5 month(s)  The format for your next appointment:   In Person  Provider:   Huntley Dec, MD    Other Instructions none  Important Information About Sugar

## 2023-06-23 NOTE — Assessment & Plan Note (Addendum)
Significant functional limitation. Appears predominantly of pulmonary component with extensive inspiratory and expiratory wheezing and chronic history of heavy smoking.  She does have abnormal CT coronary angiogram with moderate coronary artery disease however there is not significant ischemia burden on prior Lexiscan stress test in 2022 and CT FFR findings were only mild abnormal for distal LAD and OM disease.  Her symptoms appear out of proportion to the coronary artery disease observed.  Continue aggressive medical therapy for the underlying Neri artery disease.  Have her evaluated for pulmonary causes and optimize her COPD/asthma therapy. She has a visit with pulmonologist on 07-07-2023  Will keep close follow-up with her and have her come back for a visit in 1 month.  Advised her to continue taking aspirin 81 mg once daily and rosuvastatin 20 mg once daily.  Her medication regimen is unclear at this time; With duplication of calcium channel blockers amlodipine and diltiazem.  Will discontinue diltiazem With her not being able to recognize spironolactone among her medications. Encouraged her to bring medications to the next visit so we can reconcile them appropriately.  If she can use to remain symptomatic despite pulmonary therapy, Will escalate antianginal therapy with addition of metoprolol and isosorbide mononitrate.  A component of diastolic heart failure may also be playing a role with elevated BNP, diastolic dysfunction noted on prior echocardiograms and concern for pulmonary hypertension. Advised her to continue with low-sodium diet. Advised to increase torsemide to 40 mg in the morning and 20 mg in the afternoon for the next 1 week after which she will revert back.  Her current dose of 20 mg twice daily.

## 2023-06-24 ENCOUNTER — Other Ambulatory Visit: Payer: Self-pay

## 2023-06-24 DIAGNOSIS — I70213 Atherosclerosis of native arteries of extremities with intermittent claudication, bilateral legs: Secondary | ICD-10-CM

## 2023-07-07 ENCOUNTER — Encounter: Payer: Self-pay | Admitting: Pulmonary Disease

## 2023-07-07 ENCOUNTER — Ambulatory Visit (INDEPENDENT_AMBULATORY_CARE_PROVIDER_SITE_OTHER): Payer: Medicare Other | Admitting: Pulmonary Disease

## 2023-07-07 VITALS — BP 164/68 | HR 55 | Temp 97.8°F | Ht 67.0 in | Wt 157.4 lb

## 2023-07-07 DIAGNOSIS — F17210 Nicotine dependence, cigarettes, uncomplicated: Secondary | ICD-10-CM | POA: Diagnosis not present

## 2023-07-07 DIAGNOSIS — R911 Solitary pulmonary nodule: Secondary | ICD-10-CM | POA: Diagnosis not present

## 2023-07-07 DIAGNOSIS — J441 Chronic obstructive pulmonary disease with (acute) exacerbation: Secondary | ICD-10-CM | POA: Diagnosis not present

## 2023-07-07 MED ORDER — PREDNISONE 10 MG PO TABS: ORAL_TABLET | ORAL | 0 refills | Status: AC

## 2023-07-07 MED ORDER — AZITHROMYCIN 250 MG PO TABS: ORAL_TABLET | ORAL | 0 refills | Status: DC

## 2023-07-07 NOTE — Patient Instructions (Addendum)
Continue breztri inhaler 2 puffs twice daily - rinse mouth out after each use  Use albuterol inhaler 1-2 puffs every 4-6 hours as needed  Use nebulizer treatment twice daily, morning and evening time  Start prednisone taper  Start Zpak antibiotic  Follow up in 6 months

## 2023-07-07 NOTE — Progress Notes (Signed)
Synopsis: Referred in 10/2020 for COPD  Subjective:   PATIENT ID: Tina George GENDER: female DOB: 1945-12-11, MRN: [id number]   HPI  Chief Complaint  Patient presents with   Follow-up    Increased DOE, wheezing and cough off and on past several months. Her cough is non prod and sometimes wakes her up in the night.    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   OV 12/30/22 She was transitioned to [facility] 2 puffs twice daily from Symbicort with significant improvement in her breathing and has not required further steroid courses since last visit.  She continues to smoke half a pack per day. She lives with her daughter who also smokes. There is significant family history of asthma of multiple generations. Her daughter reports significant snoring events at night and apneic events as well. She also complains of heart burn symptoms. She denies sinus congestion or drainage.      Today 07/07/23 She reports increase in wheezing and shortness of breath over the past few weeks. She was on prednisone and antibiotics 3 months ago. She is smoking 1 to 5 cigarettes per day. She is using nicotine patches currently.    Past Medical History:  Diagnosis Date   Anemia 09/20/2014   Asthma 05/06/2014   Bilateral leg edema 09/15/2020   COPD (chronic obstructive pulmonary disease) (HCC)    Daytime somnolence 09/15/2020   Depression    Dyslipidemia    GERD (gastroesophageal reflux disease)    GERD (gastroesophageal reflux disease)    High blood pressure    Hypercholesterolemia 09/20/2014   Hypokalemia    Medication management 09/15/2020   Murmur 09/15/2020   Muscle cramps    Osteoarthritis 09/20/2014   Other chest pain 09/15/2020   Pain in both lower extremities 09/15/2020   Precordial pain 09/15/2020   Psoriasis    Snoring 09/15/2020   SOB (shortness of breath) 09/15/2020    Vitamin D deficiency 09/20/2014     Family History  Problem Relation Age of Onset   Pancreatic cancer Mother    Hypertension Father    Heart disease Father    Pancreatic cancer Sister    Pancreatic cancer Brother      Social History   Socioeconomic History   Marital status: Widowed    Spouse name: Not on file   Number of children: 2   Years of education: Not on file   Highest education level: Not on file  Occupational History   Not on file  Tobacco Use   Smoking status: Every Day    Current packs/day: 0.50    Types: Cigarettes   Smokeless tobacco: Never  Substance and Sexual Activity   Alcohol use: Never   Drug use: Never   Sexual activity: Not on file  Other Topics Concern   Not on file  Social History Narrative   Not on file   Social Determinants of Health   Financial Resource Strain: Not on file  Food Insecurity: Not on file  Transportation Needs: Not on file  Physical Activity: Not on file  Stress: Not on file  Social Connections: Not on file  Intimate Partner Violence: Not on file     Allergies  Allergen Reactions   Aspirin    Codeine    Levaquin [Levofloxacin In D5w]      Outpatient Medications Prior to Visit  Medication Sig Dispense Refill  albuterol (PROVENTIL HFA;VENTOLIN HFA) 108 (90 Base) MCG/ACT inhaler Inhale 1-2 puffs into the lungs as needed for wheezing or shortness of breath.     ALPRAZolam (XANAX) 0.25 MG tablet Take 0.25 mg by mouth every 6 (six) hours as needed for anxiety.     amLODipine (NORVASC) 5 MG tablet Take 5 mg by mouth daily.     Budeson-Glycopyrrol-Formoterol (BREZTRI AEROSPHERE) 160-9-4.8 MCG/ACT AERO Inhale 2 puffs into the lungs in the morning and at bedtime. 42.8 g 0   gabapentin (NEURONTIN) 300 MG capsule Take 300 mg by mouth at bedtime.     hydrochlorothiazide (HYDRODIURIL) 25 MG tablet Take 25 mg by mouth daily.     ibuprofen (ADVIL,MOTRIN) 200 MG tablet Take 200 mg by mouth as needed for pain.      ipratropium-albuterol (DUONEB) 0.5-2.5 (3) MG/3ML SOLN Take 3 mLs by nebulization every 4 (four) hours as needed for wheezing or shortness of breath.     isosorbide mononitrate (IMDUR) 30 MG 24 hr tablet Take 1 tablet (30 mg total) by mouth daily. 90 tablet 3   nitroGLYCERIN (NITROSTAT) 0.4 MG SL tablet Place 1 tablet (0.4 mg total) under the tongue every 5 (five) minutes as needed for chest pain. 25 tablet 5   pantoprazole (PROTONIX) 40 MG tablet Take 1 tablet (40 mg total) by mouth 2 (two) times daily. 60 tablet 11   PARoxetine (PAXIL) 40 MG tablet Take 40 mg by mouth every morning.     rOPINIRole (REQUIP) 3 MG tablet Take 3 mg by mouth at bedtime.     rosuvastatin (CRESTOR) 20 MG tablet Take 1 tablet (20 mg total) by mouth daily. 90 tablet 3   spironolactone (ALDACTONE) 50 MG tablet Take 50 mg by mouth daily.     torsemide (DEMADEX) 20 MG tablet Take 1 tablet (20 mg total) by mouth 2 (two) times daily. 60 tablet 3   No facility-administered medications prior to visit.    Review of Systems  Constitutional:  Negative for chills, fever, malaise/fatigue and weight loss.  HENT:  Negative for congestion, sinus pain and sore throat.   Eyes: Negative.   Respiratory:  Positive for shortness of breath and wheezing. Negative for cough, hemoptysis and sputum production.   Cardiovascular:  Negative for chest pain, palpitations, orthopnea, claudication, leg swelling and PND.  Gastrointestinal:  Positive for heartburn. Negative for abdominal pain, nausea and vomiting.  Genitourinary: Negative.   Musculoskeletal: Negative.   Skin:  Negative for rash.  Neurological: Negative.   Endo/Heme/Allergies: Negative.   Psychiatric/Behavioral: Negative.     Objective:   Vitals:   07/07/23 1043  BP: (!) 164/68  Pulse: (!) 55  Temp: 97.8 F (36.6 C)  TempSrc: Oral  SpO2: 99%  Weight: 157 lb 6.4 oz (71.4 kg)  Height: 5\' 7"  (1.702 m)     Physical Exam Constitutional:      General: She is not in acute  distress. HENT:     Head: Normocephalic and atraumatic.  Eyes:     Conjunctiva/sclera: Conjunctivae normal.  Cardiovascular:     Rate and Rhythm: Normal rate and regular rhythm.     Pulses: Normal pulses.     Heart sounds: Normal heart sounds. No murmur heard. Pulmonary:     Effort: Pulmonary effort is normal.     Breath sounds: Wheezing and rhonchi present. No rales.  Abdominal:     General: Bowel sounds are normal.     Palpations: Abdomen is soft.  Musculoskeletal:     Right lower  leg: No edema.     Left lower leg: No edema.  Skin:    General: Skin is warm and dry.  Neurological:     General: No focal deficit present.     Mental Status: She is alert.     CBC    Component Value Date/Time   WBC 9.4 04/22/2023 1445   WBC 10.9 (H) 10/27/2020 1513   RBC 4.17 04/22/2023 1445   RBC 4.58 10/27/2020 1513   HGB 13.2 04/22/2023 1445   HCT 40.0 04/22/2023 1445   PLT 224 04/22/2023 1445   MCV 96 04/22/2023 1445   MCH 31.7 04/22/2023 1445   MCHC 33.0 04/22/2023 1445   MCHC 34.0 10/27/2020 1513   RDW 13.4 04/22/2023 1445   LYMPHSABS 1.4 04/22/2023 1445   MONOABS 0.8 10/27/2020 1513   EOSABS 0.1 04/22/2023 1445   BASOSABS 0.0 04/22/2023 1445      Latest Ref Rng & Units 05/26/2023    1:07 PM 04/22/2023    2:45 PM 10/27/2020    3:13 PM  BMP  Glucose 70 - 99 mg/dL 73  75  94   BUN 8 - 27 mg/dL 18  15  25    Creatinine 0.57 - 1.00 mg/dL 2.84  1.32  4.40   BUN/Creat Ratio 12 - 28 14  12     Sodium 134 - 144 mmol/L 143  143  139   Potassium 3.5 - 5.2 mmol/L 4.3  4.6  4.5   Chloride 96 - 106 mmol/L 107  108  104   CO2 20 - 29 mmol/L 25  21  28    Calcium 8.7 - 10.3 mg/dL 9.3  9.4  9.7     Chest imaging: CXR 10/27/2020 Capsular calcification at BILATERAL breast prostheses. Normal heart size, mediastinal contours, and pulmonary vascularity. Atherosclerotic calcification aorta. Emphysematous and minimal bronchitic changes consistent with COPD. No acute infiltrate, pleural  effusion, or pneumothorax. Scattered endplate spur formation thoracic spine.  PFT:    Latest Ref Rng & Units 12/29/2020   10:57 AM  PFT Results  FVC-Pre L 1.97   FVC-Predicted Pre % 63   FVC-Post L 2.04   FVC-Predicted Post % 65   Pre FEV1/FVC % % 73   Post FEV1/FCV % % 75   FEV1-Pre L 1.43   FEV1-Predicted Pre % 61   FEV1-Post L 1.54   DLCO uncorrected ml/min/mmHg 12.14   DLCO UNC% % 59   DLCO corrected ml/min/mmHg 12.14   DLCO COR %Predicted % 59   DLVA Predicted % 95   TLC L 4.61   TLC % Predicted % 86   RV % Predicted % 104   PFTs show reduced FEV1 and FVC with normal TLC and reduced DLCO.   Echo: 10/12/20 1. Left ventricular ejection fraction, by estimation, is 60 to 65%. The  left ventricle has normal function. The left ventricle has no regional  wall motion abnormalities. Left ventricular diastolic parameters are  consistent with Grade I diastolic  dysfunction (impaired relaxation).   2. Right ventricular systolic function is normal. The right ventricular  size is normal. There is normal pulmonary artery systolic pressure.   3. The mitral valve is normal in structure. Mild mitral valve  regurgitation. No evidence of mitral stenosis.   4. The aortic valve is normal in structure. Aortic valve regurgitation is  not visualized. No aortic stenosis is present.   5. The inferior vena cava is normal in size with greater than 50%  respiratory variability, suggesting right atrial  pressure of 3 mmHg.     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   He is to continue Breztri inhaler 2 puffs twice daily.  She can use albuterol inhaler 1 to 2 puffs every 4-6 hours  as needed.  Use nebulizer treatment twice daily morning and evening time.  She is to start prednisone taper and Z-Pak antibiotic course due to wheezing and increased shortness of breath over the past couple of weeks.  I heavily encouraged her to quit smoking given her ongoing respiratory symptoms.  She is to continue using nicotine patches and recommend using nicotine lozenges as needed for breakthrough cravings.  Follow-up in 6 months.  Melody Comas, MD [facility] Office: [phone]    Current Outpatient Medications:    albuterol (PROVENTIL HFA;VENTOLIN HFA) 108 (90 Base) MCG/ACT inhaler, Inhale 1-2 puffs into the lungs as needed for wheezing or shortness of breath., Disp: , Rfl:    ALPRAZolam (XANAX) 0.25 MG tablet, Take 0.25 mg by mouth every 6 (six) hours as needed for anxiety., Disp: , Rfl:    amLODipine (NORVASC) 5 MG tablet, Take 5 mg by mouth daily., Disp: , Rfl:    azithromycin (ZITHROMAX) 250 MG tablet, Take as directed, Disp: 6 tablet, Rfl: 0   Budeson-Glycopyrrol-Formoterol (BREZTRI AEROSPHERE) 160-9-4.8 MCG/ACT AERO, Inhale 2 puffs into the lungs in the morning and at bedtime., Disp: 42.8 g, Rfl: 0   gabapentin (NEURONTIN) 300 MG capsule, Take 300 mg by mouth at bedtime., Disp: , Rfl:    hydrochlorothiazide (HYDRODIURIL) 25 MG tablet, Take 25 mg by mouth daily., Disp: , Rfl:    ibuprofen (ADVIL,MOTRIN) 200 MG tablet, Take 200 mg by mouth as needed for pain., Disp: , Rfl:    ipratropium-albuterol (DUONEB) 0.5-2.5 (3) MG/3ML SOLN, Take 3 mLs by nebulization every 4 (four) hours as needed for wheezing or shortness of breath., Disp: , Rfl:    isosorbide mononitrate (IMDUR) 30 MG 24 hr tablet, Take 1 tablet (30 mg total) by mouth daily., Disp: 90 tablet, Rfl: 3   nitroGLYCERIN (NITROSTAT) 0.4 MG SL tablet, Place 1 tablet (0.4 mg total) under the tongue every 5 (five) minutes as needed for chest pain., Disp: 25 tablet, Rfl: 5   pantoprazole (PROTONIX) 40  MG tablet, Take 1 tablet (40 mg total) by mouth 2 (two) times daily., Disp: 60 tablet, Rfl: 11   PARoxetine (PAXIL) 40 MG tablet, Take 40 mg by mouth every morning., Disp: , Rfl:    predniSONE (DELTASONE) 10 MG tablet, Take 4 tablets (40 mg total) by mouth daily with breakfast for 3 days, THEN 3 tablets (30 mg total) daily with breakfast for 3 days, THEN 2 tablets (20 mg total) daily with breakfast for 3 days, THEN 1 tablet (10 mg total) daily with breakfast for 3 days., Disp: 30 tablet, Rfl: 0   rOPINIRole (REQUIP) 3 MG tablet, Take 3 mg by mouth at bedtime., Disp: , Rfl:    rosuvastatin (CRESTOR) 20 MG tablet, Take 1 tablet (20 mg total) by mouth daily., Disp: 90 tablet, Rfl: 3   spironolactone (ALDACTONE) 50 MG tablet, Take 50 mg by mouth daily., Disp: , Rfl:    torsemide (DEMADEX) 20 MG tablet,  Take 1 tablet (20 mg total) by mouth 2 (two) times daily., Disp: 60 tablet, Rfl: 3

## 2023-07-14 ENCOUNTER — Encounter: Payer: Self-pay | Admitting: Pulmonary Disease

## 2023-07-14 NOTE — Progress Notes (Deleted)
[facility] [place]  ASSESSMENT / PLAN: Tina George is a 77 y.o. 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  Patient counseled patients with asymptomatic peripheral arterial disease or claudication have a 1-2% risk of developing chronic limb threatening ischemia, but a 15-30% risk of mortality in the next 5 years. Intervention should only be considered for medically optimized patients with disabling symptoms.   Recommend:  Abstinence from all tobacco products. Blood glucose control with goal A1c < 7%. Blood pressure control with goal blood pressure < 140/90 mmHg. Lipid reduction therapy with goal LDL-C <100 mg/dL  Aspirin 81mg  PO QD.  Atorvastatin 40-80mg  PO QD (or other "high intensity" statin therapy). Daily walking to and past the point of discomfort. Patient counseled to keep a log of exercise distance.  Patient strongly desires intervention.  I counseled her to try medical therapy first.  I will see her back in 1 to 3 months to evaluate her results with a walking regimen.  CHIEF COMPLAINT: Lower extremity pain  HISTORY OF PRESENT ILLNESS: Tina George is a 77 y.o. female referred to clinic for wound of the right lower extremity in the setting of swelling.  Patient reports this wound has healed.  She is significantly bothered by swelling in her bilateral lower extremities.  She also reports significant discomfort with walking.  She reports cramping pain in her calves that begins prior to getting to her mailbox.  The pain is relieved by rest.  She does not describe constant, severe pain in her feet.  The ulcer for which she was referred has healed, thankfully.  She has no new ulcers about her feet.  Past Medical History:  Diagnosis Date    Anemia 09/20/2014   Asthma 05/06/2014   Bilateral leg edema 09/15/2020   COPD (chronic obstructive pulmonary disease) (HCC)    Daytime somnolence 09/15/2020   Depression    Dyslipidemia    GERD (gastroesophageal reflux disease)    GERD (gastroesophageal reflux disease)    High blood pressure    Hypercholesterolemia 09/20/2014   Hypokalemia    Medication management 09/15/2020   Murmur 09/15/2020   Muscle cramps    Osteoarthritis 09/20/2014   Other chest pain 09/15/2020   Pain in both lower extremities 09/15/2020   Precordial pain 09/15/2020   Psoriasis    Snoring 09/15/2020   SOB (shortness of breath) 09/15/2020   Vitamin D deficiency 09/20/2014    Past Surgical History:  Procedure Laterality Date   ABDOMINAL HYSTERECTOMY     APPENDECTOMY     arthroscopic shoulder surgery     BACK SURGERY     CATARACT EXTRACTION, BILATERAL     CHOLECYSTECTOMY     MASTECTOMY     MASTOIDECTOMY     PARTIAL COLECTOMY      Family History  Problem Relation Age of Onset   Pancreatic cancer Mother    Hypertension Father    Heart disease Father    Pancreatic cancer Sister    Pancreatic cancer Brother     Social History   Socioeconomic History   Marital status: Widowed    Spouse name: Not on file   Number of children: 2   Years of education: Not on file   Highest education level: Not on file  Occupational History   Not on file  Tobacco  Use   Smoking status: Every Day    Current packs/day: 0.50    Types: Cigarettes   Smokeless tobacco: Never  Substance and Sexual Activity   Alcohol use: Never   Drug use: Never   Sexual activity: Not on file  Other Topics Concern   Not on file  Social History Narrative   Not on file   Social Determinants of Health   Financial Resource Strain: Not on file  Food Insecurity: Not on file  Transportation Needs: Not on file  Physical Activity: Not on file  Stress: Not on file  Social Connections: Not on file  Intimate Partner Violence:  Not on file    Allergies  Allergen Reactions   Aspirin    Codeine    Levaquin [Levofloxacin In D5w]     Current Outpatient Medications  Medication Sig Dispense Refill   albuterol (PROVENTIL HFA;VENTOLIN HFA) 108 (90 Base) MCG/ACT inhaler Inhale 1-2 puffs into the lungs as needed for wheezing or shortness of breath.     ALPRAZolam (XANAX) 0.25 MG tablet Take 0.25 mg by mouth every 6 (six) hours as needed for anxiety.     amLODipine (NORVASC) 5 MG tablet Take 5 mg by mouth daily.     azithromycin (ZITHROMAX) 250 MG tablet Take as directed 6 tablet 0   Budeson-Glycopyrrol-Formoterol (BREZTRI AEROSPHERE) 160-9-4.8 MCG/ACT AERO Inhale 2 puffs into the lungs in the morning and at bedtime. 42.8 g 0   gabapentin (NEURONTIN) 300 MG capsule Take 300 mg by mouth at bedtime.     hydrochlorothiazide (HYDRODIURIL) 25 MG tablet Take 25 mg by mouth daily.     ibuprofen (ADVIL,MOTRIN) 200 MG tablet Take 200 mg by mouth as needed for pain.     ipratropium-albuterol (DUONEB) 0.5-2.5 (3) MG/3ML SOLN Take 3 mLs by nebulization every 4 (four) hours as needed for wheezing or shortness of breath.     isosorbide mononitrate (IMDUR) 30 MG 24 hr tablet Take 1 tablet (30 mg total) by mouth daily. 90 tablet 3   nitroGLYCERIN (NITROSTAT) 0.4 MG SL tablet Place 1 tablet (0.4 mg total) under the tongue every 5 (five) minutes as needed for chest pain. 25 tablet 5   pantoprazole (PROTONIX) 40 MG tablet Take 1 tablet (40 mg total) by mouth 2 (two) times daily. 60 tablet 11   PARoxetine (PAXIL) 40 MG tablet Take 40 mg by mouth every morning.     predniSONE (DELTASONE) 10 MG tablet Take 4 tablets (40 mg total) by mouth daily with breakfast for 3 days, THEN 3 tablets (30 mg total) daily with breakfast for 3 days, THEN 2 tablets (20 mg total) daily with breakfast for 3 days, THEN 1 tablet (10 mg total) daily with breakfast for 3 days. 30 tablet 0   rOPINIRole (REQUIP) 3 MG tablet Take 3 mg by mouth at bedtime.     rosuvastatin  (CRESTOR) 20 MG tablet Take 1 tablet (20 mg total) by mouth daily. 90 tablet 3   spironolactone (ALDACTONE) 50 MG tablet Take 50 mg by mouth daily.     torsemide (DEMADEX) 20 MG tablet Take 1 tablet (20 mg total) by mouth 2 (two) times daily. 60 tablet 3   No current facility-administered medications for this visit.    PHYSICAL EXAM There were no vitals filed for this visit.   Elderly woman in no distress Regular rate and rhythm Unlabored breathing No palpable pedal pulses 2+ edema about the calves, ankles, and proximal feet typical of chronic venous insufficiency  PERTINENT  LABORATORY AND RADIOLOGIC DATA  Most recent CBC    Latest Ref Rng & Units 04/22/2023    2:45 PM 10/27/2020    3:13 PM  CBC  WBC 3.4 - 10.8 x10E3/uL 9.4  10.9   Hemoglobin 11.1 - 15.9 g/dL 16.1  09.6   Hematocrit 34.0 - 46.6 % 40.0  42.2   Platelets 150 - 450 x10E3/uL 224  274.0      Most recent CMP    Latest Ref Rng & Units 05/26/2023    1:07 PM 04/22/2023    2:45 PM 12/15/2020   12:07 PM  CMP  Glucose 70 - 99 mg/dL 73  75    BUN 8 - 27 mg/dL 18  15    Creatinine 0.45 - 1.00 mg/dL 4.09  8.11    Sodium 914 - 144 mmol/L 143  143    Potassium 3.5 - 5.2 mmol/L 4.3  4.6    Chloride 96 - 106 mmol/L 107  108    CO2 20 - 29 mmol/L 25  21    Calcium 8.7 - 10.3 mg/dL 9.3  9.4    Total Protein 6.0 - 8.5 g/dL  5.9  6.2   Total Bilirubin 0.0 - 1.2 mg/dL  0.5  0.7   Alkaline Phos 44 - 121 IU/L  66  95    77   AST 0 - 40 IU/L  13  12   ALT 0 - 32 IU/L  9  25     +-------+-----------+-----------+------------+------------+  ABI/TBIToday's ABIToday's TBIPrevious ABIPrevious TBI  +-------+-----------+-----------+------------+------------+  Right .72        .57                                  +-------+-----------+-----------+------------+------------+  Left  .58        .41                                  +-------+-----------+-----------+------------+------------+    Rande Brunt. Lenell Antu, MD  FACS [facility] Phone Number: [phone] 07/14/2023 8:02 PM   Total time spent on preparing this encounter including chart review, data review, collecting history, examining the patient, coordinating care for this new patient, 60 minutes.  Portions of this report may have been transcribed using voice recognition software.  Every effort has been made to ensure accuracy; however, inadvertent computerized transcription errors may still be present.

## 2023-07-15 ENCOUNTER — Ambulatory Visit (HOSPITAL_COMMUNITY): Payer: Medicare Other

## 2023-07-15 ENCOUNTER — Ambulatory Visit (INDEPENDENT_AMBULATORY_CARE_PROVIDER_SITE_OTHER): Payer: Medicare Other | Admitting: Vascular Surgery

## 2023-07-21 NOTE — Progress Notes (Unsigned)
[facility] [place]  ASSESSMENT / PLAN: [name] is a 77 y.o. 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  Patient counseled patients with asymptomatic peripheral arterial disease or claudication have a 1-2% risk of developing chronic limb threatening ischemia, but a 15-30% risk of mortality in the next 5 years. Intervention should only be considered for medically optimized patients with disabling symptoms.   Recommend:  Abstinence from all tobacco products. Blood glucose control with goal A1c < 7%. Blood pressure control with goal blood pressure < 140/90 mmHg. Lipid reduction therapy with goal LDL-C <100 mg/dL  Aspirin 81mg  PO QD.  Atorvastatin 40-80mg  PO QD (or other "high intensity" statin therapy). Daily walking to and past the point of discomfort. Patient counseled to keep a log of exercise distance.  Patient strongly desires intervention.  I counseled her to try medical therapy first.  I will see her back in 1 to 3 months to evaluate her results with a walking regimen.  CHIEF COMPLAINT: Lower extremity pain  HISTORY OF PRESENT ILLNESS: Tina George is a 77 y.o. female referred to clinic for wound of the right lower extremity in the setting of swelling.  Patient reports this wound has healed.  She is significantly bothered by swelling in her bilateral lower extremities.  She also reports significant discomfort with walking.  She reports cramping pain in her calves that begins prior to getting to her mailbox.  The pain is relieved by rest.  She does not describe constant, severe pain in her feet.  The ulcer for which she was referred has healed, thankfully.  She has no new ulcers about her feet.  Past Medical History:  Diagnosis Date    Anemia 09/20/2014   Asthma 05/06/2014   Bilateral leg edema 09/15/2020   COPD (chronic obstructive pulmonary disease) (HCC)    Daytime somnolence 09/15/2020   Depression    Dyslipidemia    GERD (gastroesophageal reflux disease)    GERD (gastroesophageal reflux disease)    High blood pressure    Hypercholesterolemia 09/20/2014   Hypokalemia    Medication management 09/15/2020   Murmur 09/15/2020   Muscle cramps    Osteoarthritis 09/20/2014   Other chest pain 09/15/2020   Pain in both lower extremities 09/15/2020   Precordial pain 09/15/2020   Psoriasis    Snoring 09/15/2020   SOB (shortness of breath) 09/15/2020   Vitamin D deficiency 09/20/2014    Past Surgical History:  Procedure Laterality Date   ABDOMINAL HYSTERECTOMY     APPENDECTOMY     arthroscopic shoulder surgery     BACK SURGERY     CATARACT EXTRACTION, BILATERAL     CHOLECYSTECTOMY     MASTECTOMY     MASTOIDECTOMY     PARTIAL COLECTOMY      Family History  Problem Relation Age of Onset   Pancreatic cancer Mother    Hypertension Father    Heart disease Father    Pancreatic cancer Sister    Pancreatic cancer Brother     Social History   Socioeconomic History   Marital status: Widowed    Spouse name: Not on file   Number of children: 2   Years of education: Not on file   Highest education level: Not on file  Occupational History   Not on file  Tobacco  Use   Smoking status: Every Day    Current packs/day: 0.50    Types: Cigarettes   Smokeless tobacco: Never  Substance and Sexual Activity   Alcohol use: Never   Drug use: Never   Sexual activity: Not on file  Other Topics Concern   Not on file  Social History Narrative   Not on file   Social Determinants of Health   Financial Resource Strain: Not on file  Food Insecurity: Not on file  Transportation Needs: Not on file  Physical Activity: Not on file  Stress: Not on file  Social Connections: Not on file  Intimate Partner Violence:  Not on file    Allergies  Allergen Reactions   Aspirin    Codeine    Levaquin [Levofloxacin In D5w]     Current Outpatient Medications  Medication Sig Dispense Refill   albuterol (PROVENTIL HFA;VENTOLIN HFA) 108 (90 Base) MCG/ACT inhaler Inhale 1-2 puffs into the lungs as needed for wheezing or shortness of breath.     ALPRAZolam (XANAX) 0.25 MG tablet Take 0.25 mg by mouth every 6 (six) hours as needed for anxiety.     amLODipine (NORVASC) 5 MG tablet Take 5 mg by mouth daily.     azithromycin (ZITHROMAX) 250 MG tablet Take as directed 6 tablet 0   Budeson-Glycopyrrol-Formoterol (BREZTRI AEROSPHERE) 160-9-4.8 MCG/ACT AERO Inhale 2 puffs into the lungs in the morning and at bedtime. 42.8 g 0   gabapentin (NEURONTIN) 300 MG capsule Take 300 mg by mouth at bedtime.     hydrochlorothiazide (HYDRODIURIL) 25 MG tablet Take 25 mg by mouth daily.     ibuprofen (ADVIL,MOTRIN) 200 MG tablet Take 200 mg by mouth as needed for pain.     ipratropium-albuterol (DUONEB) 0.5-2.5 (3) MG/3ML SOLN Take 3 mLs by nebulization every 4 (four) hours as needed for wheezing or shortness of breath.     isosorbide mononitrate (IMDUR) 30 MG 24 hr tablet Take 1 tablet (30 mg total) by mouth daily. 90 tablet 3   nitroGLYCERIN (NITROSTAT) 0.4 MG SL tablet Place 1 tablet (0.4 mg total) under the tongue every 5 (five) minutes as needed for chest pain. 25 tablet 5   pantoprazole (PROTONIX) 40 MG tablet Take 1 tablet (40 mg total) by mouth 2 (two) times daily. 60 tablet 11   PARoxetine (PAXIL) 40 MG tablet Take 40 mg by mouth every morning.     rOPINIRole (REQUIP) 3 MG tablet Take 3 mg by mouth at bedtime.     rosuvastatin (CRESTOR) 20 MG tablet Take 1 tablet (20 mg total) by mouth daily. 90 tablet 3   spironolactone (ALDACTONE) 50 MG tablet Take 50 mg by mouth daily.     torsemide (DEMADEX) 20 MG tablet Take 1 tablet (20 mg total) by mouth 2 (two) times daily. 60 tablet 3   No current facility-administered medications  for this visit.    PHYSICAL EXAM There were no vitals filed for this visit.   Elderly woman in no distress Regular rate and rhythm Unlabored breathing No palpable pedal pulses 2+ edema about the calves, ankles, and proximal feet typical of chronic venous insufficiency  PERTINENT LABORATORY AND RADIOLOGIC DATA  Most recent CBC    Latest Ref Rng & Units 04/22/2023    2:45 PM 10/27/2020    3:13 PM  CBC  WBC 3.4 - 10.8 x10E3/uL 9.4  10.9   Hemoglobin 11.1 - 15.9 g/dL 16.1  09.6   Hematocrit 34.0 - 46.6 % 40.0  42.2  Platelets 150 - 450 x10E3/uL 224  274.0      Most recent CMP    Latest Ref Rng & Units 05/26/2023    1:07 PM 04/22/2023    2:45 PM 12/15/2020   12:07 PM  CMP  Glucose 70 - 99 mg/dL 73  75    BUN 8 - 27 mg/dL 18  15    Creatinine 2.13 - 1.00 mg/dL 0.86  5.78    Sodium 469 - 144 mmol/L 143  143    Potassium 3.5 - 5.2 mmol/L 4.3  4.6    Chloride 96 - 106 mmol/L 107  108    CO2 20 - 29 mmol/L 25  21    Calcium 8.7 - 10.3 mg/dL 9.3  9.4    Total Protein 6.0 - 8.5 g/dL  5.9  6.2   Total Bilirubin 0.0 - 1.2 mg/dL  0.5  0.7   Alkaline Phos 44 - 121 IU/L  66  95    77   AST 0 - 40 IU/L  13  12   ALT 0 - 32 IU/L  9  25     +-------+-----------+-----------+------------+------------+  ABI/TBIToday's ABIToday's TBIPrevious ABIPrevious TBI  +-------+-----------+-----------+------------+------------+  Right .72        .57                                  +-------+-----------+-----------+------------+------------+  Left  .58        .41                                  +-------+-----------+-----------+------------+------------+    Rande Brunt. Lenell Antu, MD FACS [facility] Phone Number: [phone] 07/21/2023 11:18 AM   Total time spent on preparing this encounter including chart review, data review, collecting history, examining the patient, coordinating care for this new patient, 60 minutes.  Portions of this  report may have been transcribed using voice recognition software.  Every effort has been made to ensure accuracy; however, inadvertent computerized transcription errors may still be present.

## 2023-07-22 ENCOUNTER — Encounter: Payer: Self-pay | Admitting: Vascular Surgery

## 2023-07-22 ENCOUNTER — Ambulatory Visit (INDEPENDENT_AMBULATORY_CARE_PROVIDER_SITE_OTHER)
Admission: RE | Admit: 2023-07-22 | Discharge: 2023-07-22 | Disposition: A | Discharge status: Home / Self Care | Payer: Medicare Other | Source: Ambulatory Visit | Attending: Vascular Surgery

## 2023-07-22 ENCOUNTER — Ambulatory Visit (INDEPENDENT_AMBULATORY_CARE_PROVIDER_SITE_OTHER): Payer: Medicare Other | Admitting: Vascular Surgery

## 2023-07-22 ENCOUNTER — Ambulatory Visit (HOSPITAL_COMMUNITY)
Admission: RE | Admit: 2023-07-22 | Discharge: 2023-07-22 | Disposition: A | Discharge status: Home / Self Care | Payer: Medicare Other | Source: Ambulatory Visit | Attending: Vascular Surgery | Admitting: Vascular Surgery

## 2023-07-22 VITALS — BP 152/101 | HR 104 | Temp 97.9°F | Ht 67.0 in | Wt 144.8 lb

## 2023-07-22 DIAGNOSIS — I70213 Atherosclerosis of native arteries of extremities with intermittent claudication, bilateral legs: Secondary | ICD-10-CM

## 2023-07-22 LAB — VAS US ABI WITH/WO TBI
Left ABI: 0.63
Right ABI: 0.77

## 2023-07-25 ENCOUNTER — Other Ambulatory Visit: Payer: Self-pay

## 2023-07-25 DIAGNOSIS — I70213 Atherosclerosis of native arteries of extremities with intermittent claudication, bilateral legs: Secondary | ICD-10-CM

## 2023-08-04 ENCOUNTER — Other Ambulatory Visit: Payer: Self-pay

## 2023-08-06 ENCOUNTER — Ambulatory Visit: Payer: Medicare Other

## 2023-08-06 VITALS — BP 98/70 | HR 96 | Resp 20 | Ht 67.0 in | Wt 138.8 lb

## 2023-08-06 DIAGNOSIS — R002 Palpitations: Secondary | ICD-10-CM

## 2023-08-06 DIAGNOSIS — R55 Syncope and collapse: Secondary | ICD-10-CM

## 2023-08-06 HISTORY — DX: Syncope and collapse: R55

## 2023-08-06 HISTORY — DX: Palpitations: R00.2

## 2023-08-06 NOTE — Assessment & Plan Note (Signed)
On and off episodes.  Most prominent few days ago overnight lasting multiple minutes with sensation of tachycardia.  Will obtain a 7-day Zio patch monitor.  Will obtain transthoracic echocardiogram to rule out any cardiac structural and functional issues since her last echo was over a year ago.

## 2023-08-06 NOTE — Progress Notes (Signed)
Cardiology Consultation:    Date:  08/06/2023   ID:  Tina George, DOB 24-Oct-1945, MRN [id number]  PCP:  Simone Curia, MD  Cardiologist:  Marlyn Corporal Johnwilliam Shepperson, MD   Referring MD: Simone Curia, MD   Chief Complaint  Patient presents with   Tachycardia    Pt states that her heart rate has been elevated as high as 144.     ASSESSMENT AND PLAN:   Ms. Milberger 77 year old woman  moderate coronary atherosclerosis by CT coronary angiogram August 2024 with mildly abnormal CT FFR mildly abnormal but only in very distal LAD and OM likely due to tapering effect and; medical therapy was suggested as likely best option.  Also has history of hypertension, hyperlipidemia, CKD stage III, heavy smoker until recently now in the process of quitting, peripheral neuropathy, benign essential tremor, peripheral vascular disease with abnormal ankle-brachial indicis being medically managed and in the process of obtaining CT angiogram to evaluate for aortoiliac disease, chronic venous insufficiency, asthma/COPD, presumed diastolic CHF [normal EF on last echocardiogram July 2023 with impaired relaxation] contributing to symptoms in addition to pulmonary causes.  Poor insight into her condition and unclear compliance with medicines.    Problem List Items Addressed This Visit     Syncope and collapse    Appears orthostatic in description. Significant postural lightheadedness. Discontinue hydrochlorothiazide. Advised her to bring list of her medications to the follow-up visit which we are going to request to be done in the next couple weeks.        Relevant Orders   ECHOCARDIOGRAM COMPLETE   LONG TERM MONITOR (3-14 DAYS)   Palpitations - Primary    On and off episodes.  Most prominent few days ago overnight lasting multiple minutes with sensation of tachycardia.  Will obtain a 7-day Zio patch monitor.  Will obtain transthoracic echocardiogram to rule out any cardiac structural and functional issues since  her last echo was over a year ago.       Relevant Orders   EKG 12-Lead (Completed)   ECHOCARDIOGRAM COMPLETE   LONG TERM MONITOR (3-14 DAYS)   Return to clinic in 10 to 14 days.  Advised her to take extreme caution changing position and give herself additional time to ensure her balance is good and she is not having any symptoms before walking. Advised to bring her family members along for the next visit.     History of Present Illness:    Tina George is a 77 y.o. female who is being seen today for follow-up.  Last visit with me was 06-23-2023.  Has history of moderate coronary atherosclerosis by CT coronary angiogram August 2024 with mildly abnormal CT FFR mildly abnormal but only in very distal LAD and OM likely due to tapering effect and; medical therapy was suggested as likely best option.  Also has history of hypertension, hyperlipidemia, CKD stage III, heavy smoker until recently in the process of quitting now, peripheral neuropathy, benign essential tremor, peripheral vascular disease with abnormal ankle-brachial indicis being medically managed and in the process of obtaining CT angiogram to evaluate for aortoiliac disease, chronic venous insufficiency, asthma/COPD, presumed diastolic CHF [normal EF on last echocardiogram July 2023 with impaired relaxation] contributing to symptoms in addition to pulmonary causes.  Since her last visit with [place] she had visit with pulmonologist 07-07-2023. Also had a visit with vascular surgeon on August 20.  She is here for the visit today by herself. She did not bring her medications to this visit for  accurate med reconciliation.  She is once again unable to name the medications that she is taking at home and cannot rely on the accuracy of the medication list we have at this time.  She mentions she is feeling for very poor.  No energy.  Has significant lightheadedness and dizziness when she changes position from supine to sitting or sitting to  standing.  Has sustained a fall about 3 days ago at home when she change position.  She tries to avoid these by sitting down immediately.  She also reports an episode of palpitations overnight where she felt her heart was racing lasted for an extended time lasting multiple minutes and associated with shortness of breath.  Symptoms subsided as she continue to settle on the night.  She is currently awaiting further vascular studies as requested by the surgeon. Lower extremity edema has reduced. She has stopped taking torsemide for the past 2 weeks.  She mentions she is unclear if she is currently taking amlodipine at home or not. Her medication list also notes hydrochlorothiazide, metoprolol succinate, Imdur, spironolactone.  She continues to smoke up to 1-2 cigarette a day  Blood pressures are relatively soft today.  Last echocardiogram available is from July 2023 EF 60 to 65% with impaired relaxation.  EKG in the clinic today sinus tachycardia heart rate 110/min with occasional PVCs.  Last blood work available from May 26, 2023 BUN 18, creatinine 1.31, EGFR 42 Sodium 143, potassium 4.3 NT proBNP 847    Past Medical History:  Diagnosis Date   Anemia 09/20/2014   Asthma 05/06/2014   Bilateral leg edema 09/15/2020   COPD (chronic obstructive pulmonary disease) (HCC)    Daytime somnolence 09/15/2020   Depression    Dyslipidemia    GERD (gastroesophageal reflux disease)    GERD (gastroesophageal reflux disease)    High blood pressure    Hypercholesterolemia 09/20/2014   Hypokalemia    Medication management 09/15/2020   Murmur 09/15/2020   Muscle cramps    Osteoarthritis 09/20/2014   Other chest pain 09/15/2020   Pain in both lower extremities 09/15/2020   Precordial pain 09/15/2020   Psoriasis    Snoring 09/15/2020   SOB (shortness of breath) 09/15/2020   Vitamin D deficiency 09/20/2014    Past Surgical History:  Procedure Laterality Date   ABDOMINAL HYSTERECTOMY      APPENDECTOMY     arthroscopic shoulder surgery     BACK SURGERY     CATARACT EXTRACTION, BILATERAL     CHOLECYSTECTOMY     MASTECTOMY     MASTOIDECTOMY     PARTIAL COLECTOMY      Current Medications: Current Meds  Medication Sig   albuterol (PROVENTIL HFA;VENTOLIN HFA) 108 (90 Base) MCG/ACT inhaler Inhale 1-2 puffs into the lungs as needed for wheezing or shortness of breath.   ALPRAZolam (XANAX) 0.25 MG tablet Take 0.25 mg by mouth every 6 (six) hours as needed for anxiety.   amLODipine (NORVASC) 5 MG tablet Take 5 mg by mouth daily.   azithromycin (ZITHROMAX) 250 MG tablet Take as directed   Budeson-Glycopyrrol-Formoterol (BREZTRI AEROSPHERE) 160-9-4.8 MCG/ACT AERO Inhale 2 puffs into the lungs in the morning and at bedtime.   buPROPion (WELLBUTRIN XL) 150 MG 24 hr tablet Take 150 mg by mouth every morning.   gabapentin (NEURONTIN) 300 MG capsule Take 300 mg by mouth at bedtime.   hydrochlorothiazide (HYDRODIURIL) 25 MG tablet Take 25 mg by mouth daily.   ibuprofen (ADVIL,MOTRIN) 200 MG tablet Take 200 mg  by mouth as needed for pain.   ipratropium-albuterol (DUONEB) 0.5-2.5 (3) MG/3ML SOLN Take 3 mLs by nebulization every 4 (four) hours as needed for wheezing or shortness of breath.   isosorbide mononitrate (IMDUR) 30 MG 24 hr tablet Take 1 tablet (30 mg total) by mouth daily.   metoprolol succinate (TOPROL-XL) 100 MG 24 hr tablet Take 100 mg by mouth daily.   nitroGLYCERIN (NITROSTAT) 0.4 MG SL tablet Place 1 tablet (0.4 mg total) under the tongue every 5 (five) minutes as needed for chest pain.   pantoprazole (PROTONIX) 40 MG tablet Take 1 tablet (40 mg total) by mouth 2 (two) times daily.   PARoxetine (PAXIL) 40 MG tablet Take 40 mg by mouth every morning.   rOPINIRole (REQUIP) 3 MG tablet Take 3 mg by mouth at bedtime.   rosuvastatin (CRESTOR) 20 MG tablet Take 1 tablet (20 mg total) by mouth daily.   spironolactone (ALDACTONE) 50 MG tablet Take 50 mg by mouth daily.    traMADol (ULTRAM) 50 MG tablet Take 50 mg by mouth 3 (three) times daily as needed for moderate pain (pain score 4-6) or severe pain (pain score 7-10).     Allergies:   Aspirin, Codeine, and Levaquin [levofloxacin in d5w]   Social History   Socioeconomic History   Marital status: Widowed    Spouse name: Not on file   Number of children: 2   Years of education: Not on file   Highest education level: Not on file  Occupational History   Not on file  Tobacco Use   Smoking status: Every Day    Current packs/day: 0.50    Types: Cigarettes   Smokeless tobacco: Never   Tobacco comments:    1-2 cigs daily  Substance and Sexual Activity   Alcohol use: Never   Drug use: Never   Sexual activity: Not on file  Other Topics Concern   Not on file  Social History Narrative   Not on file   Social Determinants of Health   Financial Resource Strain: Not on file  Food Insecurity: Not on file  Transportation Needs: Not on file  Physical Activity: Not on file  Stress: Not on file  Social Connections: Not on file     Family History: The patient's family history includes Heart disease in her father; Hypertension in her father; Pancreatic cancer in her brother, mother, and sister. ROS:   Please see the history of present illness.    All 14 point review of systems negative except as described per history of present illness.  EKGs/Labs/Other Studies Reviewed:    The following studies were reviewed today:   EKG:       Recent Labs: 04/22/2023: ALT 9; Hemoglobin 13.2; Platelets 224 05/26/2023: BUN 18; Creatinine, Ser 1.31; NT-Pro BNP 847; Potassium 4.3; Sodium 143  Recent Lipid Panel No results found for: "CHOL", "TRIG", "HDL", "CHOLHDL", "VLDL", "LDLCALC", "LDLDIRECT"  Physical Exam:    VS:  BP 98/70 (BP Location: Right Arm, Patient Position: Sitting, Cuff Size: Normal)   Pulse 96   Resp 20   Ht 5\' 7"  (1.702 m)   Wt 138 lb 12.8 oz (63 kg)   SpO2 96%   BMI 21.74 kg/m     Wt  Readings from Last 3 Encounters:  08/06/23 138 lb 12.8 oz (63 kg)  07/22/23 144 lb 12.8 oz (65.7 kg)  07/07/23 157 lb 6.4 oz (71.4 kg)     GENERAL:  Well nourished, well developed in no acute distress NECK:  No JVD; No carotid bruits CARDIAC: RRR, S1 and S2 present, no murmurs, no rubs, no gallops CHEST:  Clear to auscultation without rales, wheezing or rhonchi  ABDOMEN: Soft, non-tender, non-distended Extremities: No pitting pedal edema. Pulses bilaterally symmetric with radial 2+ and dorsalis pedis 2+ NEUROLOGIC:  Alert and oriented x 3  Medication Adjustments/Labs and Tests Ordered: Current medicines are reviewed at length with the patient today.  Concerns regarding medicines are outlined above.  Orders Placed This Encounter  Procedures   LONG TERM MONITOR (3-14 DAYS)   EKG 12-Lead   ECHOCARDIOGRAM COMPLETE   No orders of the defined types were placed in this encounter.   Signed, Cecille Amsterdam, MD, MPH, [facility]. 08/06/2023 2:34 PM    [facility]

## 2023-08-06 NOTE — Assessment & Plan Note (Signed)
Appears orthostatic in description. Significant postural lightheadedness. Discontinue hydrochlorothiazide. Advised her to bring list of her medications to the follow-up visit which we are going to request to be done in the next couple weeks.

## 2023-08-06 NOTE — Patient Instructions (Signed)
Medication Instructions:  Your physician recommends that you continue on your current medications as directed. Please refer to the Current Medication list given to you today.  *If you need a refill on your cardiac medications before your next appointment, please call your pharmacy*   Lab Work: None ordered If you have labs (blood work) drawn today and your tests are completely normal, you will receive your results only by: MyChart Message (if you have MyChart) OR A paper copy in the mail If you have any lab test that is abnormal or we need to change your treatment, we will call you to review the results.   Testing/Procedures: Your physician has requested that you have an echocardiogram. Echocardiography is a painless test that uses sound waves to create images of your heart. It provides your doctor with information about the size and shape of your heart and how well your heart's chambers and valves are working. This procedure takes approximately one hour. There are no restrictions for this procedure. Please do NOT wear cologne, perfume, aftershave, or lotions (deodorant is allowed). Please arrive 15 minutes prior to your appointment time.  A zio monitor was ordered today. It will remain on for 7 days. Remove 08/13/23. You will then return monitor and event diary in provided box. It takes 1-2 weeks for report to be downloaded and returned to [place]. We will call you with the results. If monitor falls off or has orange flashing light, please call Zio for further instructions.    Follow-Up: At [facility], you and your health needs are our priority.  As part of our continuing mission to provide you with exceptional heart care, we have created designated Provider Care Teams.  These Care Teams include your primary Cardiologist (physician) and Advanced Practice Providers (APPs -  Physician Assistants and Nurse Practitioners) who all work together to provide you with the care you need, when  you need it.  We recommend signing up for the patient portal called "MyChart".  Sign up information is provided on this After Visit Summary.  MyChart is used to connect with patients for Virtual Visits (Telemedicine).  Patients are able to view lab/test results, encounter notes, upcoming appointments, etc.  Non-urgent messages can be sent to your provider as well.   To learn more about what you can do with MyChart, go to [email or web address].    Your next appointment:   10-14 day(s)  The format for your next appointment:   In Person  Provider:   Huntley Dec, MD    Other Instructions none  Important Information About Sugar

## 2023-08-18 ENCOUNTER — Ambulatory Visit: Payer: Medicare Other

## 2023-08-18 VITALS — BP 118/58 | HR 64 | Ht 67.0 in | Wt 146.8 lb

## 2023-08-18 DIAGNOSIS — R42 Dizziness and giddiness: Secondary | ICD-10-CM

## 2023-08-18 DIAGNOSIS — R0602 Shortness of breath: Secondary | ICD-10-CM

## 2023-08-18 DIAGNOSIS — R002 Palpitations: Secondary | ICD-10-CM

## 2023-08-18 HISTORY — DX: Dizziness and giddiness: R42

## 2023-08-18 NOTE — Assessment & Plan Note (Signed)
Appears predominantly pulmonary component as previously reviewed on initial consult in September. Does have abnormal CT coronary imaging with mildly abnormal CT FFR. Continue therapy for coronary artery disease with aspirin 81 mg once daily and rosuvastatin 20 mg once daily  Previously presumed diagnosis of diastolic CHF based on transthoracic echocardiogram from July 2023,noted impaired diastolic dysfunction. Recommended to continue on low salt diet. However at this time not on any further diuretic regimen. Will monitor results from repeat transthoracic echocardiogram, currently pending from November 13th

## 2023-08-18 NOTE — Assessment & Plan Note (Signed)
Resolved with discontinuation of hydrochlorothiazide and improvement in heart rates with beta-blockers. Encouraged her to keep herself well-hydrated. Encouraged caution changing position.

## 2023-08-18 NOTE — Patient Instructions (Signed)
Medication Instructions:  Your physician recommends that you continue on your current medications as directed. Please refer to the Current Medication list given to you today.  *If you need a refill on your cardiac medications before your next appointment, please call your pharmacy*   Lab Work: None ordered If you have labs (blood work) drawn today and your tests are completely normal, you will receive your results only by: MyChart Message (if you have MyChart) OR A paper copy in the mail If you have any lab test that is abnormal or we need to change your treatment, we will call you to review the results.   Testing/Procedures: None ordered   Follow-Up: At [facility], you and your health needs are our priority.  As part of our continuing mission to provide you with exceptional heart care, we have created designated Provider Care Teams.  These Care Teams include your primary Cardiologist (physician) and Advanced Practice Providers (APPs -  Physician Assistants and Nurse Practitioners) who all work together to provide you with the care you need, when you need it.  We recommend signing up for the patient portal called "MyChart".  Sign up information is provided on this After Visit Summary.  MyChart is used to connect with patients for Virtual Visits (Telemedicine).  Patients are able to view lab/test results, encounter notes, upcoming appointments, etc.  Non-urgent messages can be sent to your provider as well.   To learn more about what you can do with MyChart, go to [email or web address].    Your next appointment:   2 month(s)  The format for your next appointment:   In Person  Provider:   Huntley Dec, MD    Other Instructions none  Important Information About Sugar

## 2023-08-18 NOTE — Progress Notes (Signed)
Cardiology Consultation:    Date:  08/18/2023   ID:  Tina George, DOB 05-13-1946, MRN [id number]  PCP:  Simone Curia, MD  Cardiologist:  Marlyn Corporal Amarisa Wilinski, MD   Referring MD: Simone Curia, MD   No chief complaint on file.    ASSESSMENT AND PLAN:   Tina George 77/F with history of moderate coronary atherosclerosis by CT coronary angiogram in August 2024 with mildly abnormal CT FFR in very distal LAD and OM territories likely due to tapering effect and medical therapy was recommended, hypertension, hyperlipidemia, CKD stage III, heavy smoker and currently in the process of quitting, peripheral neuropathy, benign essential tremor, peripheral vascular disease with abnormal ABI being medically managed and following with vascular surgeon to assess with CT aorta iliac angiogram, chronic venous insufficiency, asthma/COPD, presumed diastolic CHF [normal EF on echocardiogram July 2023 with impaired relaxation], palpitations and postural lightheadedness symptoms and poor insight into her underlying issues.    Problem List Items Addressed This Visit     SOB (shortness of breath)    Appears predominantly pulmonary component as previously reviewed on initial consult in September. Does have abnormal CT coronary imaging with mildly abnormal CT FFR. Continue therapy for coronary artery disease with aspirin 81 mg once daily and rosuvastatin 20 mg once daily  Previously presumed diagnosis of diastolic CHF based on transthoracic echocardiogram from July 2023,noted impaired diastolic dysfunction. Recommended to continue on low salt diet. However at this time not on any further diuretic regimen. Will monitor results from repeat transthoracic echocardiogram, currently pending from November 13th       Palpitations - Primary    Improved symptoms. Consistently taking metoprolol XL 100 mg once daily. Advised to keep herself hydrated but avoid salt in her diet.  Will review Zio patch results once  available.       Postural lightheadedness    Resolved with discontinuation of hydrochlorothiazide and improvement in heart rates with beta-blockers. Encouraged her to keep herself well-hydrated. Encouraged caution changing position.        Return to office for full follow-up in 2 months. History of Present Illness:    Tina George is a 77 y.o. female who is being seen today for follow-up, initially seen for consult on 06-23-2023, her last visit at our office was 07-07-2023, requested for an early follow-up given her poor insight into the disease and poor medication reconciliation advised her to bring to the medicines for next visit. Her PCP is Simone Curia, MD.  With history of moderate coronary atherosclerosis by CT coronary angiogram and mildly abnormal CT FFR being medically managed, hypertension, hyperlipidemia, CKD stage III, smoker, peripheral vascular disease, benign essential tremor, chronic venous insufficiency, asthma, COPD, presumed diastolic CHF, palpitations and postural lightheadedness symptoms resulting in a fall couple weeks ago at home.  Last echocardiogram July 2023 EF 60 to 65% with impaired relaxation. Repeat echocardiogram ordered currently pending for November 13.  She has completed the monitoring and returned the Zio patch will be ordered at last office visit, results currently pending.  She mentions her symptoms have improved since she has discontinued hydrochlorothiazide and started metoprolol succinate 100 mg once daily.  She still occasionally feels lightheadedness on positional changes from supine to standing or sitting to standing.  She has been using a cane to ambulate and does better.  For the last 2 to 3 days she has had flareup of gout in her right ankle and using ibuprofen as needed for this.  Mentions she has been  cutting back on her smoking and is down to 2 to 3 cigarettes a day.   Past Medical History:  Diagnosis Date   Anemia 09/20/2014   Asthma  05/06/2014   Bilateral leg edema 09/15/2020   COPD (chronic obstructive pulmonary disease) (HCC)    Daytime somnolence 09/15/2020   Dyslipidemia    GERD (gastroesophageal reflux disease)    GERD (gastroesophageal reflux disease)    High blood pressure    Hypercholesterolemia 09/20/2014   Hypokalemia    Medication management 09/15/2020   Murmur 09/15/2020   Muscle cramps    Osteoarthritis 09/20/2014   Other chest pain 09/15/2020   Pain in both lower extremities 09/15/2020   Palpitations 08/06/2023   Precordial pain 09/15/2020   Psoriasis    Snoring 09/15/2020   SOB (shortness of breath) 09/15/2020   Syncope and collapse 08/06/2023   Vitamin D deficiency 09/20/2014    Past Surgical History:  Procedure Laterality Date   ABDOMINAL HYSTERECTOMY     APPENDECTOMY     arthroscopic shoulder surgery     BACK SURGERY     CATARACT EXTRACTION, BILATERAL     CHOLECYSTECTOMY     MASTECTOMY     MASTOIDECTOMY     PARTIAL COLECTOMY      Current Medications: Current Meds  Medication Sig   albuterol (PROVENTIL HFA;VENTOLIN HFA) 108 (90 Base) MCG/ACT inhaler Inhale 1-2 puffs into the lungs as needed for wheezing or shortness of breath.   ALPRAZolam (XANAX) 0.25 MG tablet Take 0.25 mg by mouth every 6 (six) hours as needed for anxiety.   Budeson-Glycopyrrol-Formoterol (BREZTRI AEROSPHERE) 160-9-4.8 MCG/ACT AERO Inhale 2 puffs into the lungs in the morning and at bedtime.   gabapentin (NEURONTIN) 300 MG capsule Take 300 mg by mouth at bedtime.   ibuprofen (ADVIL,MOTRIN) 200 MG tablet Take 200 mg by mouth as needed for pain.   ipratropium-albuterol (DUONEB) 0.5-2.5 (3) MG/3ML SOLN Take 3 mLs by nebulization every 4 (four) hours as needed for wheezing or shortness of breath.   metoprolol succinate (TOPROL-XL) 100 MG 24 hr tablet Take 100 mg by mouth daily.   nitroGLYCERIN (NITROSTAT) 0.4 MG SL tablet Place 1 tablet (0.4 mg total) under the tongue every 5 (five) minutes as needed for chest  pain.   PARoxetine (PAXIL) 40 MG tablet Take 40 mg by mouth every morning.   rosuvastatin (CRESTOR) 20 MG tablet Take 1 tablet (20 mg total) by mouth daily.   traMADol (ULTRAM) 50 MG tablet Take 50 mg by mouth 3 (three) times daily as needed for moderate pain (pain score 4-6) or severe pain (pain score 7-10).     Allergies:   Aspirin, Codeine, and Levaquin [levofloxacin in d5w]   Social History   Socioeconomic History   Marital status: Widowed    Spouse name: Not on file   Number of children: 2   Years of education: Not on file   Highest education level: Not on file  Occupational History   Not on file  Tobacco Use   Smoking status: Every Day    Current packs/day: 0.50    Types: Cigarettes   Smokeless tobacco: Never   Tobacco comments:    1-2 cigs daily  Substance and Sexual Activity   Alcohol use: Never   Drug use: Never   Sexual activity: Not on file  Other Topics Concern   Not on file  Social History Narrative   Not on file   Social Determinants of Health   Financial Resource Strain: Not on  file  Food Insecurity: Not on file  Transportation Needs: Not on file  Physical Activity: Not on file  Stress: Not on file  Social Connections: Not on file     Family History: The patient's family history includes Heart disease in her father; Hypertension in her father; Pancreatic cancer in her brother, mother, and sister. ROS:   Please see the history of present illness.    All 14 point review of systems negative except as described per history of present illness.  EKGs/Labs/Other Studies Reviewed:    The following studies were reviewed today:   EKG:       Recent Labs: 04/22/2023: ALT 9; Hemoglobin 13.2; Platelets 224 05/26/2023: BUN 18; Creatinine, Ser 1.31; NT-Pro BNP 847; Potassium 4.3; Sodium 143  Recent Lipid Panel No results found for: "CHOL", "TRIG", "HDL", "CHOLHDL", "VLDL", "LDLCALC", "LDLDIRECT"  Physical Exam:    VS:  BP (!) 118/58   Pulse 64   Ht 5\' 7"   (1.702 m)   Wt 146 lb 12.8 oz (66.6 kg)   SpO2 95%   BMI 22.99 kg/m     Wt Readings from Last 3 Encounters:  08/18/23 146 lb 12.8 oz (66.6 kg)  08/06/23 138 lb 12.8 oz (63 kg)  07/22/23 144 lb 12.8 oz (65.7 kg)     GENERAL:  Well nourished, well developed in no acute distress CARDIAC: RRR, S1 and S2 present, no murmurs, no rubs, no gallops CHEST:  Clear to auscultation without rales, wheezing or rhonchi  Extremities: Mild redness and swelling of the right ankle joint tender to touch on the medial aspect.  No pedal edema bilaterally.  NEUROLOGIC:  Alert and oriented x 3  Medication Adjustments/Labs and Tests Ordered: Current medicines are reviewed at length with the patient today.  Concerns regarding medicines are outlined above.  No orders of the defined types were placed in this encounter.  No orders of the defined types were placed in this encounter.   Signed, Indiah Heyden reddy Dorlene Footman, MD, MPH, [facility]. 08/18/2023 2:25 PM    [facility]

## 2023-08-18 NOTE — Assessment & Plan Note (Signed)
Improved symptoms. Consistently taking metoprolol XL 100 mg once daily. Advised to keep herself hydrated but avoid salt in her diet.  Will review Zio patch results once available.

## 2023-08-19 ENCOUNTER — Ambulatory Visit (INDEPENDENT_AMBULATORY_CARE_PROVIDER_SITE_OTHER): Payer: Self-pay | Admitting: Vascular Surgery

## 2023-08-19 DIAGNOSIS — I70213 Atherosclerosis of native arteries of extremities with intermittent claudication, bilateral legs: Secondary | ICD-10-CM

## 2023-08-19 NOTE — Progress Notes (Signed)
CT angiogram reviewed.  I do not see an explanation for absent femoral pulses.  I do see significant atherosclerosis in the superficial femoral arteries bilaterally.  The patient reports her right lower extremity is worse.  She is tried a appropriate trial of medical therapy and continues to have disabling symptoms.  Will plan for right lower extremity angiogram with possible intervention as a schedule allows.  Rande Brunt. Lenell Antu, MD [facility] Phone Number: [phone] 08/19/2023 4:50 PM

## 2023-08-19 NOTE — H&P (View-Only) (Signed)
CT angiogram reviewed.  I do not see an explanation for absent femoral pulses.  I do see significant atherosclerosis in the superficial femoral arteries bilaterally.  The patient reports her right lower extremity is worse.  She is tried a appropriate trial of medical therapy and continues to have disabling symptoms.  Will plan for right lower extremity angiogram with possible intervention as a schedule allows.  Rande Brunt. Lenell Antu, MD [facility] Phone Number: [phone] 08/19/2023 4:50 PM

## 2023-08-22 ENCOUNTER — Other Ambulatory Visit: Payer: Self-pay

## 2023-08-22 DIAGNOSIS — I70213 Atherosclerosis of native arteries of extremities with intermittent claudication, bilateral legs: Secondary | ICD-10-CM

## 2023-08-26 ENCOUNTER — Ambulatory Visit: Payer: Medicare Other | Admitting: Cardiology

## 2023-08-27 ENCOUNTER — Ambulatory Visit: Payer: Medicare Other

## 2023-08-27 DIAGNOSIS — R002 Palpitations: Secondary | ICD-10-CM | POA: Diagnosis not present

## 2023-08-27 DIAGNOSIS — R55 Syncope and collapse: Secondary | ICD-10-CM | POA: Insufficient documentation

## 2023-08-27 LAB — ECHOCARDIOGRAM COMPLETE
Area-P 1/2: 3.77 cm2
S' Lateral: 2.5 cm

## 2023-09-05 ENCOUNTER — Other Ambulatory Visit (HOSPITAL_COMMUNITY): Payer: Self-pay

## 2023-09-05 ENCOUNTER — Other Ambulatory Visit: Payer: Self-pay

## 2023-09-05 ENCOUNTER — Ambulatory Visit (HOSPITAL_COMMUNITY)
Admission: RE | Admit: 2023-09-05 | Discharge: 2023-09-05 | Disposition: A | Discharge status: Home / Self Care | Payer: Medicare Other | Attending: Vascular Surgery | Admitting: Vascular Surgery

## 2023-09-05 ENCOUNTER — Encounter (HOSPITAL_COMMUNITY): Payer: Self-pay | Admitting: Vascular Surgery

## 2023-09-05 ENCOUNTER — Encounter (HOSPITAL_COMMUNITY)
Admission: RE | Disposition: A | Discharge status: Home / Self Care | Payer: Self-pay | Source: Home / Self Care | Attending: Vascular Surgery

## 2023-09-05 DIAGNOSIS — F17210 Nicotine dependence, cigarettes, uncomplicated: Secondary | ICD-10-CM | POA: Insufficient documentation

## 2023-09-05 DIAGNOSIS — R0602 Shortness of breath: Secondary | ICD-10-CM | POA: Diagnosis not present

## 2023-09-05 DIAGNOSIS — I70213 Atherosclerosis of native arteries of extremities with intermittent claudication, bilateral legs: Secondary | ICD-10-CM | POA: Diagnosis present

## 2023-09-05 DIAGNOSIS — Z8249 Family history of ischemic heart disease and other diseases of the circulatory system: Secondary | ICD-10-CM | POA: Diagnosis not present

## 2023-09-05 DIAGNOSIS — Z79899 Other long term (current) drug therapy: Secondary | ICD-10-CM | POA: Insufficient documentation

## 2023-09-05 HISTORY — PX: ABDOMINAL AORTOGRAM W/LOWER EXTREMITY: CATH118223

## 2023-09-05 HISTORY — PX: PERIPHERAL VASCULAR INTERVENTION: CATH118257

## 2023-09-05 LAB — POCT I-STAT, CHEM 8
BUN: 10 mg/dL (ref 8–23)
Calcium, Ion: 1.25 mmol/L (ref 1.15–1.40)
Chloride: 102 mmol/L (ref 98–111)
Creatinine, Ser: 1.3 mg/dL — ABNORMAL HIGH (ref 0.44–1.00)
Glucose, Bld: 93 mg/dL (ref 70–99)
HCT: 34 % — ABNORMAL LOW (ref 36.0–46.0)
Hemoglobin: 11.6 g/dL — ABNORMAL LOW (ref 12.0–15.0)
Potassium: 3 mmol/L — ABNORMAL LOW (ref 3.5–5.1)
Sodium: 143 mmol/L (ref 135–145)
TCO2: 24 mmol/L (ref 22–32)

## 2023-09-05 SURGERY — ABDOMINAL AORTOGRAM W/LOWER EXTREMITY
Anesthesia: LOCAL | Laterality: Right

## 2023-09-05 MED ORDER — CLOPIDOGREL BISULFATE 75 MG PO TABS: 75.0000 mg | ORAL_TABLET | Freq: Every day | ORAL | Status: DC

## 2023-09-05 MED ORDER — HYDRALAZINE HCL 20 MG/ML IJ SOLN
INTRAMUSCULAR | Status: DC | PRN
  Administered 2023-09-05: 20 mg via INTRAVENOUS

## 2023-09-05 MED ORDER — ACETAMINOPHEN 325 MG PO TABS
650.0000 mg | ORAL_TABLET | ORAL | Status: DC | PRN
Start: 2023-09-05 — End: 2023-09-05

## 2023-09-05 MED ORDER — LABETALOL HCL 5 MG/ML IV SOLN
10.0000 mg | INTRAVENOUS | Status: DC | PRN
Start: 2023-09-05 — End: 2023-09-05

## 2023-09-05 MED ORDER — CLOPIDOGREL BISULFATE 300 MG PO TABS
ORAL_TABLET | ORAL | Status: AC
  Filled 2023-09-05: qty 1

## 2023-09-05 MED ORDER — CLOPIDOGREL BISULFATE 75 MG PO TABS
75.0000 mg | ORAL_TABLET | Freq: Every day | ORAL | 11 refills | Status: DC
  Filled 2023-09-05: qty 30, 30d supply, fill #0

## 2023-09-05 MED ORDER — FENTANYL CITRATE (PF) 100 MCG/2ML IJ SOLN
INTRAMUSCULAR | Status: DC | PRN
  Administered 2023-09-05: 50 ug via INTRAVENOUS

## 2023-09-05 MED ORDER — MIDAZOLAM HCL 2 MG/2ML IJ SOLN
INTRAMUSCULAR | Status: AC
Start: 2023-09-05 — End: ?
  Filled 2023-09-05: qty 2

## 2023-09-05 MED ORDER — SODIUM CHLORIDE 0.9 % IV SOLN: INTRAVENOUS | Status: DC

## 2023-09-05 MED ORDER — HEPARIN SODIUM (PORCINE) 1000 UNIT/ML IJ SOLN
INTRAMUSCULAR | Status: AC
  Filled 2023-09-05: qty 10

## 2023-09-05 MED ORDER — LIDOCAINE HCL (PF) 1 % IJ SOLN
INTRAMUSCULAR | Status: AC
  Filled 2023-09-05: qty 30

## 2023-09-05 MED ORDER — SODIUM CHLORIDE 0.9 % WEIGHT BASED INFUSION: 1.0000 mL/kg/h | INTRAVENOUS | Status: DC

## 2023-09-05 MED ORDER — CLOPIDOGREL BISULFATE 75 MG PO TABS
300.0000 mg | ORAL_TABLET | Freq: Once | ORAL | Status: AC
  Administered 2023-09-05: 300 mg via ORAL
  Filled 2023-09-05: qty 4

## 2023-09-05 MED ORDER — SODIUM CHLORIDE 0.9 % IV SOLN: 250.0000 mL | INTRAVENOUS | Status: DC | PRN

## 2023-09-05 MED ORDER — HEPARIN (PORCINE) IN NACL 1000-0.9 UT/500ML-% IV SOLN
INTRAVENOUS | Status: DC | PRN
  Administered 2023-09-05: 500 mL

## 2023-09-05 MED ORDER — FENTANYL CITRATE (PF) 100 MCG/2ML IJ SOLN
INTRAMUSCULAR | Status: AC
  Filled 2023-09-05: qty 2

## 2023-09-05 MED ORDER — LIDOCAINE HCL (PF) 1 % IJ SOLN
INTRAMUSCULAR | Status: DC | PRN
  Administered 2023-09-05: 15 mL
  Administered 2023-09-05: 5 mL

## 2023-09-05 MED ORDER — HYDRALAZINE HCL 20 MG/ML IJ SOLN
INTRAMUSCULAR | Status: AC
  Filled 2023-09-05: qty 1

## 2023-09-05 MED ORDER — SODIUM CHLORIDE 0.9% FLUSH: 3.0000 mL | Freq: Two times a day (BID) | INTRAVENOUS | Status: DC

## 2023-09-05 MED ORDER — HYDRALAZINE HCL 20 MG/ML IJ SOLN
5.0000 mg | INTRAMUSCULAR | Status: DC | PRN
Start: 2023-09-05 — End: 2023-09-05

## 2023-09-05 MED ORDER — IODIXANOL 320 MG/ML IV SOLN
INTRAVENOUS | Status: DC | PRN
  Administered 2023-09-05: 110 mL

## 2023-09-05 MED ORDER — HEPARIN SODIUM (PORCINE) 1000 UNIT/ML IJ SOLN
INTRAMUSCULAR | Status: DC | PRN
  Administered 2023-09-05: 7000 [IU] via INTRAVENOUS

## 2023-09-05 MED ORDER — MIDAZOLAM HCL 2 MG/2ML IJ SOLN
INTRAMUSCULAR | Status: DC | PRN
  Administered 2023-09-05: 1 mg via INTRAVENOUS

## 2023-09-05 MED ORDER — SODIUM CHLORIDE 0.9% FLUSH: 3.0000 mL | INTRAVENOUS | Status: DC | PRN

## 2023-09-05 MED ORDER — CLOPIDOGREL BISULFATE 300 MG PO TABS
ORAL_TABLET | ORAL | Status: DC | PRN
  Administered 2023-09-05: 300 mg via ORAL

## 2023-09-05 MED ORDER — ONDANSETRON HCL 4 MG/2ML IJ SOLN
4.0000 mg | Freq: Four times a day (QID) | INTRAMUSCULAR | Status: DC | PRN
  Administered 2023-09-05: 4 mg via INTRAVENOUS
  Filled 2023-09-05: qty 2

## 2023-09-05 MED ORDER — FAMOTIDINE IN NACL 20-0.9 MG/50ML-% IV SOLN
20.0000 mg | Freq: Once | INTRAVENOUS | Status: AC
  Administered 2023-09-05: 20 mg via INTRAVENOUS
  Filled 2023-09-05: qty 50

## 2023-09-05 SURGICAL SUPPLY — 19 items
BALLN MUSTANG 5X150X135 (BALLOONS) ×1
BALLOON MUSTANG 5X150X135 (BALLOONS) IMPLANT
CATH OMNI FLUSH 5F 65CM (CATHETERS) IMPLANT
CATH QUICKCROSS SUPP .035X90CM (MICROCATHETER) IMPLANT
CLOSURE PERCLOSE PROSTYLE (VASCULAR PRODUCTS) IMPLANT
GLIDEWIRE ADV .035X260CM (WIRE) IMPLANT
GUIDEWIRE ANGLED .035X260CM (WIRE) IMPLANT
KIT ENCORE 26 ADVANTAGE (KITS) IMPLANT
KIT MICROPUNCTURE NIT STIFF (SHEATH) IMPLANT
KIT PV (KITS) ×1 IMPLANT
SHEATH CATAPULT 6F 45 RDC (SHEATH) IMPLANT
SHEATH PINNACLE 5F 10CM (SHEATH) IMPLANT
STENT ELUVIA 6X150X130 (Permanent Stent) IMPLANT
STENT ELUVIA 6X80X130 (Permanent Stent) IMPLANT
SYR MEDRAD MARK 7 150ML (SYRINGE) ×1 IMPLANT
TRANSDUCER W/STOPCOCK (MISCELLANEOUS) ×1 IMPLANT
TRAY PV CATH (CUSTOM PROCEDURE TRAY) ×1 IMPLANT
TUBING CIL FLEX 10 FLL-RA (TUBING) IMPLANT
WIRE BENTSON .035X145CM (WIRE) IMPLANT

## 2023-09-05 NOTE — Op Note (Signed)
DATE OF SERVICE: 09/05/2023  PATIENT:  Tina George  77 y.o. 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 Lenell Antu, MD  ASSISTANT: none  ANESTHESIA:   local and IV sedation  ESTIMATED BLOOD LOSS: minimal  LOCAL MEDICATIONS USED:  LIDOCAINE   COUNTS: confirmed correct.  PATIENT DISPOSITION:  PACU - hemodynamically stable.   Delay start of Pharmacological VTE agent (>24hrs) due to surgical blood loss or risk of bleeding: no  INDICATION FOR PROCEDURE: SILVERIA LAKO is a 77 y.o. female with bilateral leg claudication which has not responded to a trial of medical therapy. After careful discussion of risks, benefits, and alternatives the patient was offered angiography with intervention. The patient understood and wished to proceed.  OPERATIVE FINDINGS:   Left renal artery: patent Right renal artery: patent  Infrarenal aorta: patent  Left common iliac artery: patent Right common iliac artery: patent  Left internal iliac artery: patent Right internal iliac artery: patent  Left external iliac artery: patent Right external iliac artery: patent  Left common femoral artery: not studied Right common femoral artery: patent  Left profunda femoris artery: not studied Right profunda femoris artery: patent  Left superficial femoral artery: not studied Right superficial femoral artery: diffuse disease; heavily calcified. 99% focal stenosis in mid section. Proximal to this, several areas of severe stenosis, greatest 75%.   Left popliteal artery: not studied Right popliteal artery: patent  Left  anterior tibial artery: not studied Right anterior tibial artery: patent proximally; tapers distally. Disadvantaged about the ankle  Left tibioperoneal trunk: not studied Right tibioperoneal trunk: patent  Left peroneal artery: not studied Right peroneal artery: patent proximally; tapers distally. Disadvantaged about the ankle  Left posterior tibial artery: not studied Right posterior tibial artery: patent proximally; tapers distally. Disadvantaged about the ankle  Left pedal circulation: not studied Right pedal circulation: severely disadvantaged   GLASS score. FP 3. IP 0. P 1. Stage II.   WIfI score. Not applicable.  DESCRIPTION OF PROCEDURE: After identification of the patient in the pre-operative holding area, the patient was transferred to the operating room. The patient was positioned supine on the operating room table. Anesthesia was induced. The groins was prepped and draped in standard fashion. A surgical pause was performed confirming correct patient, procedure, and operative location.  The left groin was anesthetized with subcutaneous injection of 1% lidocaine. Using ultrasound guidance, the left common femoral artery was accessed with micropuncture technique. Fluoroscopy was used to confirm cannulation over the femoral head. The 69F micropuncture sheath was upsized to 21F.   A Benson wire was advanced into the distal aorta. Over the wire an omni flush catheter was advanced to the level of L2. Aortogram was performed - see above for details.   The right common iliac artery was selected with an omniflush catheter and glidewire guidewire. The wire was advanced into the common femoral artery. Over the wire the omni flush catheter was advanced into the external iliac artery. Selective angiography was performed - see above for details.   The decision was made to intervene. The patient was heparinized with 7,000 units of heparin. The 21F sheath was exchanged for a 63F x 45cm sheath.  Selective  angiography of the left lower extremity was performed prior to intervention.   The lesions were treated with: Right femoropopliteal angioplasty and stenting (6x134mm, 6x146mm, 6x2mm Eluvia)  Completion angiography revealed:  Resolution of R SFA disease  A perclose device was used to close the arteriotomy. Hemostasis was excellent upon completion.   Conscious sedation was administered with the use of IV fentanyl and midazolam under continuous physician and nurse monitoring.  Heart rate, blood pressure, and oxygen saturation were continuously monitored.  Total sedation time was 45 minutes  Upon completion of the case instrument and sharps counts were confirmed correct. The patient was transferred to the PACU in good condition. I was present for all portions of the procedure.  PLAN: Patient allergic to ASA. Load with plavix and start 75mg  PO every day tomorrow. Statin therapy. Plan LLE angiogram next 2-3 weeks to treat contralateral disease.  Rande Brunt. Lenell Antu, MD [facility] Phone Number: [phone] 09/05/2023 10:54 AM

## 2023-09-05 NOTE — Interval H&P Note (Signed)
History and Physical Interval Note:  09/05/2023 10:53 AM  Tina George  has presented today for surgery, with the diagnosis of pad w/ claudication.  The various methods of treatment have been discussed with the patient and family. After consideration of risks, benefits and other options for treatment, the patient has consented to  Procedure(s) with comments: ABDOMINAL AORTOGRAM W/LOWER EXTREMITY (Right) PERIPHERAL VASCULAR INTERVENTION (Right) - SFA as a surgical intervention.  The patient's history has been reviewed, patient examined, no change in status, stable for surgery.  I have reviewed the patient's chart and labs.  Questions were answered to the patient's satisfaction.     [name]

## 2023-09-05 NOTE — Discharge Instructions (Signed)
Femoral Site Care The following information offers guidance on how to care for yourself after your procedure. Your health care provider may also give you more specific instructions. If you have problems or questions, contact your health care provider. What can I expect after the procedure? After the procedure, it is common to have bruising and tenderness at the incision site. This usually fades within 1-2 weeks. Follow these instructions at home: Incision site care  Follow instructions from your health care provider about how to take care of your incision site. Make sure you: Wash your hands with soap and water for at least 20 seconds before and after you change your bandage (dressing). If soap and water are not available, use hand sanitizer. Remove your dressing in 24 hours. Leave stitches (sutures), skin glue, or adhesive strips in place. These skin closures may need to stay in place for 2 weeks or longer. If adhesive strip edges start to loosen and curl up, you may trim the loose edges. Do not remove adhesive strips completely unless your health care provider tells you to do that. Do not take baths, swim, or use a hot tub for at least 1 week. You may shower 24 hours after the procedure or as told by your health care provider. Gently wash the incision site with plain soap and water. Pat the area dry with a clean towel. Do not rub the site. This may cause bleeding. Do not apply powder or lotion to the site. Keep the site clean and dry. Check your femoral site every day for signs of infection. Check for: Redness, swelling, or pain. Fluid or blood. Warmth. Pus or a bad smell. Activity If you were given a sedative during the procedure, it can affect you for several hours. Do not drive or operate machinery until your health care provider says that it is safe. Rest as told by your health care provider. Avoid sitting for a long time without moving. Get up to take short walks every 1-2 hours. This  is important to improve blood flow and breathing. Ask for help if you feel weak or unsteady. Return to your normal activities as told by your health care provider. Ask your health care provider what activities are safe for you and when you can return to work. Avoid activities that take a lot of effort for the first 2-3 days after your procedure, or as long as directed. Do not lift anything that is heavier than 10 lb (4.5 kg), or the limit that you are told, until your health care provider says that it is safe. General instructions Take over-the-counter and prescription medicines only as told by your health care provider. If you will be going home right after the procedure, plan to have a responsible adult care for you for the time you are told. This is important. Keep all follow-up visits. This is important. Contact a health care provider if: You have a fever or chills. You have any of these signs of infection at your incision site: Redness, swelling, or pain. Fluid or blood. Warmth. Pus or a bad smell. Get help right away if: The incision area swells very fast. The incision area is bleeding, and the bleeding does not stop when you hold steady pressure on the area. Your leg or foot becomes pale, cool, tingly, or numb. These symptoms may represent a serious problem that is an emergency. Do not wait to see if the symptoms will go away. Get medical help right away. Call your local emergency  services (911 in the [place] Do not drive yourself to the hospital. Summary After the procedure, it is common to have bruising and tenderness that fade within 1-2 weeks. Check your femoral site every day for signs of infection. Do not lift anything that is heavier than 10 lb (4.5 kg), or the limit that you are told, until your health care provider says that it is safe. Get help right away if the incision area swells very fast, you have bleeding at the incision area that does not stop, or your leg or foot  becomes pale, cool, or numb. This information is not intended to replace advice given to you by your health care provider. Make sure you discuss any questions you have with your health care provider. Document Revised: 06/20/2021 Document Reviewed: 11/19/2020 Elsevier Patient Education  2024 [employer].

## 2023-09-09 ENCOUNTER — Other Ambulatory Visit: Payer: Self-pay

## 2023-09-09 DIAGNOSIS — I70213 Atherosclerosis of native arteries of extremities with intermittent claudication, bilateral legs: Secondary | ICD-10-CM

## 2023-09-10 ENCOUNTER — Other Ambulatory Visit (HOSPITAL_COMMUNITY): Payer: Self-pay

## 2023-09-17 ENCOUNTER — Telehealth: Payer: Self-pay

## 2023-09-17 NOTE — Telephone Encounter (Signed)
Pt called to r/s her AGM to the following week due to having a virus with fever. She has been r/s to 09/26/23 and is aware of this updated date/time. No further questions/concerns at this time.

## 2023-09-22 ENCOUNTER — Telehealth: Payer: Self-pay

## 2023-09-22 NOTE — Telephone Encounter (Signed)
Spoke with Tresa Endo and advised that the pt needs to go to the ED for evaluation as her sx are concerning for her heart. Pt has diarrhea but no nausea. Pt has used one NTG but unsure if it made a difference. Tresa Endo states that her mom does not like ED's because her husband died in one. Advised that with her recent cardiac CT findings and sx and not getting an evaluation can lead to death or damage to her heart if she is having a MI. Tresa Endo verbalized understanding and will try to get her to the ED or Urgent Care.

## 2023-09-22 NOTE — Telephone Encounter (Signed)
Received a call from patient's daughter Mliss Fritz, who reports patient has not been feeling well and does not think she can proceed with aortogram procedure on this Friday. She will contact our office back when ready to reschedule.

## 2023-09-22 NOTE — Telephone Encounter (Signed)
Pt daughter called in stating pt feels like "an elephant is sitting on her chest". She gave her a nitroglycerin but she denied any pain or any other symptoms. Please advise.

## 2023-09-25 ENCOUNTER — Telehealth: Payer: Self-pay

## 2023-09-25 NOTE — Telephone Encounter (Signed)
-----   Message from [place] Mad[place]dy sent at 09/25/2023  4:57 PM EST ----- Please inform her that the echocardiogram results show normal pumping and relaxation function of the heart. No significant valve abnormalities.  Also inform her that the heart monitor results showed no major abnormalities. She does have extra beats occurring from top chambers of the heart [supraventricular ectopy] occasionally, up to 2.7% of the time.  Sometimes these occur back-to-back and the longest such episode lasted about 12 seconds.   There were no symptoms reported throughout the monitoring..  Please find out if she has any ongoing active symptoms while she had the monitor on.

## 2023-09-25 NOTE — Telephone Encounter (Signed)
No voice mail.

## 2023-09-25 NOTE — Progress Notes (Signed)
Please inform her that the echocardiogram results show normal pumping and relaxation function of the heart. No significant valve abnormalities.  Also inform her that the heart monitor results showed no major abnormalities. She does have extra beats occurring from top chambers of the heart [supraventricular ectopy] occasionally, up to 2.7% of the time.  Sometimes these occur back-to-back and the longest such episode lasted about 12 seconds.   There were no symptoms reported throughout the monitoring..  Please find out if she has any ongoing active symptoms while she had the monitor on.

## 2023-09-26 ENCOUNTER — Ambulatory Visit (HOSPITAL_COMMUNITY): Admission: RE | Admit: 2023-09-26 | Payer: Medicare Other | Source: Home / Self Care | Admitting: Vascular Surgery

## 2023-09-26 ENCOUNTER — Encounter (HOSPITAL_COMMUNITY): Admission: RE | Payer: Self-pay | Source: Home / Self Care

## 2023-09-26 ENCOUNTER — Telehealth: Payer: Self-pay

## 2023-09-26 SURGERY — ABDOMINAL AORTOGRAM W/LOWER EXTREMITY
Anesthesia: LOCAL

## 2023-09-26 NOTE — Telephone Encounter (Signed)
Called patient and she reported that she had been having multiple episodes of chest tightness which lasted from seconds to minutes over the past few weeks. Last Friday evening she had an episode of chest pressure which lasted the entire evening. She has also had more episodes of chest pressure throughout this past week. On 12/12 she went to her PCP and her heart rate was 34 and she states that an EKG was performed. She did not know the results of the EKG. Her PCP recommended that she contact her cardiologist. Spoke with Dr. Vincent Gros regarding the patient's symptoms and he recommended that the patient go to the ER to be evaluated. I relayed this information to the patient and she stated that she would go to the ER. Patient had no further questions at this time.

## 2023-09-26 NOTE — Telephone Encounter (Signed)
  Pt said, she saw her pcp yesterday because of the episode she had last Saturday. It felt like there was an elephant sitting on her chase pretty much all day last Saturday. She said, her HR went as low as 32 and also her BP is low. She wanted to check in if she can see Dr. Vincent Gros. Also, to discuss her result for echo and heart monitor. She said, she is slow and may not reach the phone on time, if she didn't answer to call her back right away to give her enough time to answer the phone

## 2023-09-27 ENCOUNTER — Encounter (HOSPITAL_BASED_OUTPATIENT_CLINIC_OR_DEPARTMENT_OTHER): Payer: Self-pay

## 2023-09-27 ENCOUNTER — Emergency Department (HOSPITAL_BASED_OUTPATIENT_CLINIC_OR_DEPARTMENT_OTHER): Payer: Medicare Other

## 2023-09-27 ENCOUNTER — Other Ambulatory Visit: Payer: Self-pay

## 2023-09-27 ENCOUNTER — Inpatient Hospital Stay (HOSPITAL_BASED_OUTPATIENT_CLINIC_OR_DEPARTMENT_OTHER)
Admission: EM | Admit: 2023-09-27 | Discharge: 2023-09-30 | DRG: 243 | Disposition: A | Discharge status: Home / Self Care | Payer: Medicare Other | Attending: Internal Medicine | Admitting: Internal Medicine

## 2023-09-27 DIAGNOSIS — Z90710 Acquired absence of both cervix and uterus: Secondary | ICD-10-CM

## 2023-09-27 DIAGNOSIS — Z886 Allergy status to analgesic agent status: Secondary | ICD-10-CM

## 2023-09-27 DIAGNOSIS — I10 Essential (primary) hypertension: Secondary | ICD-10-CM | POA: Diagnosis present

## 2023-09-27 DIAGNOSIS — J45909 Unspecified asthma, uncomplicated: Secondary | ICD-10-CM | POA: Diagnosis present

## 2023-09-27 DIAGNOSIS — I495 Sick sinus syndrome: Secondary | ICD-10-CM | POA: Diagnosis not present

## 2023-09-27 DIAGNOSIS — E785 Hyperlipidemia, unspecified: Secondary | ICD-10-CM | POA: Diagnosis present

## 2023-09-27 DIAGNOSIS — M79604 Pain in right leg: Secondary | ICD-10-CM | POA: Diagnosis present

## 2023-09-27 DIAGNOSIS — Z716 Tobacco abuse counseling: Secondary | ICD-10-CM

## 2023-09-27 DIAGNOSIS — R001 Bradycardia, unspecified: Secondary | ICD-10-CM | POA: Diagnosis not present

## 2023-09-27 DIAGNOSIS — R252 Cramp and spasm: Secondary | ICD-10-CM | POA: Diagnosis present

## 2023-09-27 DIAGNOSIS — Z7951 Long term (current) use of inhaled steroids: Secondary | ICD-10-CM

## 2023-09-27 DIAGNOSIS — F17210 Nicotine dependence, cigarettes, uncomplicated: Secondary | ICD-10-CM | POA: Diagnosis present

## 2023-09-27 DIAGNOSIS — J4489 Other specified chronic obstructive pulmonary disease: Secondary | ICD-10-CM | POA: Diagnosis present

## 2023-09-27 DIAGNOSIS — J42 Unspecified chronic bronchitis: Secondary | ICD-10-CM

## 2023-09-27 DIAGNOSIS — Z885 Allergy status to narcotic agent status: Secondary | ICD-10-CM

## 2023-09-27 DIAGNOSIS — I739 Peripheral vascular disease, unspecified: Secondary | ICD-10-CM | POA: Diagnosis present

## 2023-09-27 DIAGNOSIS — R600 Localized edema: Secondary | ICD-10-CM | POA: Diagnosis not present

## 2023-09-27 DIAGNOSIS — D649 Anemia, unspecified: Secondary | ICD-10-CM | POA: Diagnosis present

## 2023-09-27 DIAGNOSIS — I878 Other specified disorders of veins: Secondary | ICD-10-CM | POA: Diagnosis present

## 2023-09-27 DIAGNOSIS — Z1152 Encounter for screening for COVID-19: Secondary | ICD-10-CM

## 2023-09-27 DIAGNOSIS — E876 Hypokalemia: Secondary | ICD-10-CM | POA: Diagnosis present

## 2023-09-27 DIAGNOSIS — F32A Depression, unspecified: Secondary | ICD-10-CM | POA: Diagnosis present

## 2023-09-27 DIAGNOSIS — Z23 Encounter for immunization: Secondary | ICD-10-CM

## 2023-09-27 DIAGNOSIS — K219 Gastro-esophageal reflux disease without esophagitis: Secondary | ICD-10-CM | POA: Diagnosis present

## 2023-09-27 DIAGNOSIS — Z79899 Other long term (current) drug therapy: Secondary | ICD-10-CM

## 2023-09-27 DIAGNOSIS — E7800 Pure hypercholesterolemia, unspecified: Secondary | ICD-10-CM | POA: Diagnosis present

## 2023-09-27 DIAGNOSIS — M79605 Pain in left leg: Secondary | ICD-10-CM

## 2023-09-27 DIAGNOSIS — Y831 Surgical operation with implant of artificial internal device as the cause of abnormal reaction of the patient, or of later complication, without mention of misadventure at the time of the procedure: Secondary | ICD-10-CM | POA: Diagnosis present

## 2023-09-27 DIAGNOSIS — R519 Headache, unspecified: Secondary | ICD-10-CM | POA: Diagnosis not present

## 2023-09-27 DIAGNOSIS — T82856A Stenosis of peripheral vascular stent, initial encounter: Secondary | ICD-10-CM | POA: Diagnosis present

## 2023-09-27 DIAGNOSIS — Z9049 Acquired absence of other specified parts of digestive tract: Secondary | ICD-10-CM

## 2023-09-27 DIAGNOSIS — R509 Fever, unspecified: Secondary | ICD-10-CM | POA: Diagnosis not present

## 2023-09-27 DIAGNOSIS — Z8249 Family history of ischemic heart disease and other diseases of the circulatory system: Secondary | ICD-10-CM

## 2023-09-27 DIAGNOSIS — F419 Anxiety disorder, unspecified: Secondary | ICD-10-CM | POA: Diagnosis present

## 2023-09-27 DIAGNOSIS — R079 Chest pain, unspecified: Secondary | ICD-10-CM

## 2023-09-27 DIAGNOSIS — J449 Chronic obstructive pulmonary disease, unspecified: Secondary | ICD-10-CM | POA: Diagnosis present

## 2023-09-27 DIAGNOSIS — Z7902 Long term (current) use of antithrombotics/antiplatelets: Secondary | ICD-10-CM

## 2023-09-27 DIAGNOSIS — Z881 Allergy status to other antibiotic agents status: Secondary | ICD-10-CM

## 2023-09-27 HISTORY — DX: Bradycardia, unspecified: R00.1

## 2023-09-27 LAB — CBC
HCT: 33.5 % — ABNORMAL LOW (ref 36.0–46.0)
HCT: 34 % — ABNORMAL LOW (ref 36.0–46.0)
Hemoglobin: 10.5 g/dL — ABNORMAL LOW (ref 12.0–15.0)
Hemoglobin: 10.9 g/dL — ABNORMAL LOW (ref 12.0–15.0)
MCH: 29.5 pg (ref 26.0–34.0)
MCH: 29.6 pg (ref 26.0–34.0)
MCHC: 31.3 g/dL (ref 30.0–36.0)
MCHC: 32.1 g/dL (ref 30.0–36.0)
MCV: 94.1 fL (ref 80.0–100.0)
MCV: 92.4 fL (ref 80.0–100.0)
Platelets: 188 10*3/uL (ref 150–400)
Platelets: 192 10*3/uL (ref 150–400)
RBC: 3.56 MIL/uL — ABNORMAL LOW (ref 3.87–5.11)
RBC: 3.68 MIL/uL — ABNORMAL LOW (ref 3.87–5.11)
RDW: 14.2 % (ref 11.5–15.5)
RDW: 14.1 % (ref 11.5–15.5)
WBC: 10.4 10*3/uL (ref 4.0–10.5)
WBC: 10.7 10*3/uL — ABNORMAL HIGH (ref 4.0–10.5)
nRBC: 0 % (ref 0.0–0.2)
nRBC: 0 % (ref 0.0–0.2)

## 2023-09-27 LAB — RETICULOCYTES
Immature Retic Fract: 15.1 % (ref 2.3–15.9)
RBC.: 3.61 MIL/uL — ABNORMAL LOW (ref 3.87–5.11)
Retic Count, Absolute: 51.3 10*3/uL (ref 19.0–186.0)
Retic Ct Pct: 1.4 % (ref 0.4–3.1)

## 2023-09-27 LAB — BASIC METABOLIC PANEL
Anion gap: 7 (ref 5–15)
BUN: 17 mg/dL (ref 8–23)
CO2: 24 mmol/L (ref 22–32)
Calcium: 8.5 mg/dL — ABNORMAL LOW (ref 8.9–10.3)
Chloride: 106 mmol/L (ref 98–111)
Creatinine, Ser: 1.17 mg/dL — ABNORMAL HIGH (ref 0.44–1.00)
GFR, Estimated: 48 mL/min — ABNORMAL LOW (ref >60)
Glucose, Bld: 94 mg/dL (ref 70–99)
Potassium: 3.3 mmol/L — ABNORMAL LOW (ref 3.5–5.1)
Sodium: 137 mmol/L (ref 135–145)

## 2023-09-27 LAB — IRON AND TIBC
Iron: 35 ug/dL (ref 28–170)
Saturation Ratios: 11 % (ref 10.4–31.8)
TIBC: 319 ug/dL (ref 250–450)
UIBC: 284 ug/dL

## 2023-09-27 LAB — TROPONIN I (HIGH SENSITIVITY)
Troponin I (High Sensitivity): 14 ng/L (ref <18)
Troponin I (High Sensitivity): 12 ng/L (ref <18)

## 2023-09-27 LAB — CREATININE, SERUM
Creatinine, Ser: 1.3 mg/dL — ABNORMAL HIGH (ref 0.44–1.00)
GFR, Estimated: 43 mL/min — ABNORMAL LOW (ref >60)

## 2023-09-27 LAB — MAGNESIUM: Magnesium: 2 mg/dL (ref 1.7–2.4)

## 2023-09-27 LAB — FERRITIN: Ferritin: 209 ng/mL (ref 11–307)

## 2023-09-27 LAB — TSH: TSH: 1.584 u[IU]/mL (ref 0.350–4.500)

## 2023-09-27 LAB — VITAMIN B12: Vitamin B-12: 234 pg/mL (ref 180–914)

## 2023-09-27 LAB — FOLATE: Folate: 6.4 ng/mL (ref >5.9)

## 2023-09-27 MED ORDER — ACETAMINOPHEN 325 MG PO TABS
650.0000 mg | ORAL_TABLET | Freq: Once | ORAL | Status: AC
  Administered 2023-09-27: 650 mg via ORAL
  Filled 2023-09-27: qty 2

## 2023-09-27 MED ORDER — HYDRALAZINE HCL 25 MG PO TABS: 25.0000 mg | ORAL_TABLET | Freq: Four times a day (QID) | ORAL | Status: DC | PRN

## 2023-09-27 MED ORDER — ACETAMINOPHEN 325 MG PO TABS
650.0000 mg | ORAL_TABLET | Freq: Four times a day (QID) | ORAL | Status: DC | PRN
  Administered 2023-09-28 – 2023-09-29 (×3): 650 mg via ORAL
  Filled 2023-09-27 (×3): qty 2

## 2023-09-27 MED ORDER — ONDANSETRON HCL 4 MG/2ML IJ SOLN: 4.0000 mg | Freq: Four times a day (QID) | INTRAMUSCULAR | Status: DC | PRN

## 2023-09-27 MED ORDER — POTASSIUM CHLORIDE CRYS ER 20 MEQ PO TBCR
40.0000 meq | EXTENDED_RELEASE_TABLET | Freq: Once | ORAL | Status: AC
Start: 2023-09-27 — End: 2023-09-27
  Administered 2023-09-27: 40 meq via ORAL
  Filled 2023-09-27: qty 2

## 2023-09-27 MED ORDER — IBUPROFEN 200 MG PO TABS: 200.0000 mg | ORAL_TABLET | Freq: Three times a day (TID) | ORAL | Status: DC | PRN

## 2023-09-27 MED ORDER — ROSUVASTATIN CALCIUM 20 MG PO TABS
20.0000 mg | ORAL_TABLET | Freq: Every day | ORAL | Status: DC
  Administered 2023-09-28 – 2023-09-30 (×3): 20 mg via ORAL
  Filled 2023-09-27 (×3): qty 1

## 2023-09-27 MED ORDER — ALBUTEROL SULFATE (2.5 MG/3ML) 0.083% IN NEBU: 3.0000 mL | INHALATION_SOLUTION | RESPIRATORY_TRACT | Status: DC | PRN

## 2023-09-27 MED ORDER — GABAPENTIN 300 MG PO CAPS
300.0000 mg | ORAL_CAPSULE | Freq: Every day | ORAL | Status: DC
  Administered 2023-09-27 – 2023-09-29 (×3): 300 mg via ORAL
  Filled 2023-09-27 (×3): qty 1

## 2023-09-27 MED ORDER — CLOPIDOGREL BISULFATE 75 MG PO TABS
75.0000 mg | ORAL_TABLET | Freq: Every day | ORAL | Status: DC
  Administered 2023-09-28 – 2023-09-29 (×2): 75 mg via ORAL
  Filled 2023-09-27 (×2): qty 1

## 2023-09-27 MED ORDER — HYDROCHLOROTHIAZIDE 12.5 MG PO TABS: 12.5000 mg | ORAL_TABLET | Freq: Every day | ORAL | Status: DC

## 2023-09-27 MED ORDER — PAROXETINE HCL 20 MG PO TABS
40.0000 mg | ORAL_TABLET | Freq: Every morning | ORAL | Status: DC
  Administered 2023-09-28 – 2023-09-30 (×3): 40 mg via ORAL
  Filled 2023-09-27 (×3): qty 2

## 2023-09-27 MED ORDER — ADULT MULTIVITAMIN W/MINERALS CH
1.0000 | ORAL_TABLET | Freq: Every morning | ORAL | Status: DC
  Administered 2023-09-28 – 2023-09-30 (×3): 1 via ORAL
  Filled 2023-09-27 (×4): qty 1

## 2023-09-27 MED ORDER — NITROGLYCERIN 0.4 MG SL SUBL: 0.4000 mg | SUBLINGUAL_TABLET | SUBLINGUAL | Status: DC | PRN

## 2023-09-27 MED ORDER — ENOXAPARIN SODIUM 40 MG/0.4ML IJ SOSY
40.0000 mg | PREFILLED_SYRINGE | INTRAMUSCULAR | Status: DC
  Administered 2023-09-27 – 2023-09-28 (×2): 40 mg via SUBCUTANEOUS
  Filled 2023-09-27 (×2): qty 0.4

## 2023-09-27 MED ORDER — ONDANSETRON HCL 4 MG PO TABS: 4.0000 mg | ORAL_TABLET | Freq: Four times a day (QID) | ORAL | Status: DC | PRN

## 2023-09-27 MED ORDER — ALPRAZOLAM 0.25 MG PO TABS
0.2500 mg | ORAL_TABLET | Freq: Four times a day (QID) | ORAL | Status: DC | PRN
  Administered 2023-09-28 – 2023-09-29 (×2): 0.25 mg via ORAL
  Filled 2023-09-27 (×2): qty 1

## 2023-09-27 MED ORDER — ACETAMINOPHEN 650 MG RE SUPP: 650.0000 mg | Freq: Four times a day (QID) | RECTAL | Status: DC | PRN

## 2023-09-27 NOTE — Consult Note (Incomplete)
Cardiology Consultation   Patient ID: Tina George MRN: [id number]; DOB: Dec 09, 1945  Admit date: 09/27/2023 Date of Consult: 09/27/2023  PCP:  Simone Curia, MD   [facility] Providers Cardiologist:  None   { Click here to update MD or APP on Care Team, Refresh:1}     Patient Profile:   Tina George is a 77 y.o. female with a hx of *** who is being seen 09/27/2023 for the evaluation of *** at the request of ***.  History of Present Illness:   Ms. Foltyn ***   Past Medical History:  Diagnosis Date   Anemia 09/20/2014   Asthma 05/06/2014   Bilateral leg edema 09/15/2020   COPD (chronic obstructive pulmonary disease) (HCC)    Daytime somnolence 09/15/2020   Dyslipidemia    GERD (gastroesophageal reflux disease)    GERD (gastroesophageal reflux disease)    High blood pressure    Hypercholesterolemia 09/20/2014   Hypokalemia    Medication management 09/15/2020   Murmur 09/15/2020   Muscle cramps    Osteoarthritis 09/20/2014   Other chest pain 09/15/2020   Pain in both lower extremities 09/15/2020   Palpitations 08/06/2023   Precordial pain 09/15/2020   Psoriasis    Snoring 09/15/2020   SOB (shortness of breath) 09/15/2020   Syncope and collapse 08/06/2023   Vitamin D deficiency 09/20/2014    Past Surgical History:  Procedure Laterality Date   ABDOMINAL AORTOGRAM W/LOWER EXTREMITY Right 09/05/2023   Procedure: ABDOMINAL AORTOGRAM W/LOWER EXTREMITY;  Surgeon: [name], MD;  Location: [facility];  Service: Cardiovascular;  Laterality: Right;   ABDOMINAL HYSTERECTOMY     APPENDECTOMY     arthroscopic shoulder surgery     BACK SURGERY     CATARACT EXTRACTION, BILATERAL     CHOLECYSTECTOMY     MASTECTOMY     MASTOIDECTOMY     PARTIAL COLECTOMY     PERIPHERAL VASCULAR INTERVENTION Right 09/05/2023   Procedure: PERIPHERAL VASCULAR INTERVENTION;  Surgeon: [name], MD;  Location: [facility];  Service: Cardiovascular;   Laterality: Right;  SFA     {Home Medications (Optional):21181}  Inpatient Medications: Scheduled Meds:  Continuous Infusions:  PRN Meds:   Allergies:    Allergies  Allergen Reactions   Aspirin Itching, Swelling and Other (See Comments)    Tongue swelling   Levaquin [Levofloxacin In D5w] Other (See Comments)    Severe yeast reaction.   Codeine Rash    Social History:   Social History   Socioeconomic History   Marital status: Widowed    Spouse name: Not on file   Number of children: 2   Years of education: Not on file   Highest education level: Not on file  Occupational History   Not on file  Tobacco Use   Smoking status: Every Day    Current packs/day: 0.50    Types: Cigarettes   Smokeless tobacco: Never   Tobacco comments:    1-2 cigs daily  Substance and Sexual Activity   Alcohol use: Never   Drug use: Never   Sexual activity: Not on file  Other Topics Concern   Not on file  Social History Narrative   Not on file   Social Drivers of Health   Financial Resource Strain: Not on file  Food Insecurity: Not on file  Transportation Needs: Not on file  Physical Activity: Not on file  Stress: Not on file  Social Connections: Not on file  Intimate Partner  Violence: Not on file    Family History:   *** Family History  Problem Relation Age of Onset   Pancreatic cancer Mother    Hypertension Father    Heart disease Father    Pancreatic cancer Sister    Pancreatic cancer Brother      ROS:  Please see the history of present illness.  *** All other ROS reviewed and negative.     Physical Exam/Data:   Vitals:   09/27/23 1120 09/27/23 1121 09/27/23 1121 09/27/23 1230  BP: (!) 181/74   (!) 179/62  Pulse: (!) 43 (!) 44  (!) 44  Resp: 16 16  20   Temp:   98.2 F (36.8 C) 98.2 F (36.8 C)  TempSrc:   Oral   SpO2: 100% 100%  96%  Weight:      Height:       No intake or output data in the 24 hours ending 09/27/23 1332    09/27/2023   11:17 AM  09/05/2023    8:43 AM 08/18/2023    1:59 PM  Last 3 Weights  Weight (lbs) 142 lb 139 lb 146 lb 12.8 oz  Weight (kg) 64.411 kg 63.05 kg 66.588 kg     Body mass index is 22.24 kg/m.  General:  Well nourished, well developed, in no acute distress*** HEENT: normal Neck: no JVD Vascular: No carotid bruits; Distal pulses 2+ bilaterally Cardiac:  normal S1, S2; RRR; no murmur *** Lungs:  clear to auscultation bilaterally, no wheezing, rhonchi or rales  Abd: soft, nontender, no hepatomegaly  Ext: no edema Musculoskeletal:  No deformities, BUE and BLE strength normal and equal Skin: warm and dry  Neuro:  CNs 2-12 intact, no focal abnormalities noted Psych:  Normal affect   EKG:  The EKG was personally reviewed and demonstrates:  *** Telemetry:  Telemetry was personally reviewed and demonstrates:  ***  Relevant CV Studies: ***  Laboratory Data:  High Sensitivity Troponin:   Recent Labs  Lab 09/27/23 1123  TROPONINIHS 14     Chemistry Recent Labs  Lab 09/27/23 1123  NA 137  K 3.3*  CL 106  CO2 24  GLUCOSE 94  BUN 17  CREATININE 1.17*  CALCIUM 8.5*  GFRNONAA 48*  ANIONGAP 7    No results for input(s): "PROT", "ALBUMIN", "AST", "ALT", "ALKPHOS", "BILITOT" in the last 168 hours. Lipids No results for input(s): "CHOL", "TRIG", "HDL", "LABVLDL", "LDLCALC", "CHOLHDL" in the last 168 hours.  Hematology Recent Labs  Lab 09/27/23 1123  WBC 10.4  RBC 3.56*  HGB 10.5*  HCT 33.5*  MCV 94.1  MCH 29.5  MCHC 31.3  RDW 14.2  PLT 188   Thyroid No results for input(s): "TSH", "FREET4" in the last 168 hours.  BNPNo results for input(s): "BNP", "PROBNP" in the last 168 hours.  DDimer No results for input(s): "DDIMER" in the last 168 hours.   Radiology/Studies:  DG Chest Port 1 View Result Date: 09/27/2023 CLINICAL DATA:  Chest pain EXAM: PORTABLE CHEST 1 VIEW COMPARISON:  04/28/2022 chest radiograph. FINDINGS: Bilateral breast prostheses noted. Stable cardiomediastinal  silhouette with top-normal heart size. No pneumothorax. Slight blunting of both costophrenic angles. No overt pulmonary edema. No consolidative airspace disease. IMPRESSION: Slight blunting of both costophrenic angles, cannot exclude trace bilateral pleural effusions. Otherwise no active cardiopulmonary disease. Electronically Signed   By: Delbert Phenix M.D.   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  Press F2         :[id number]}   {Is the patient being seen for unstable angina, ACS, NSTEMI or STEMI?:[phone]} {Does this patient have CHF or CHF symptoms?      :[id number]} {Does this patient have ATRIAL FIBRILLATION?:[phone]}  {Are we signing off today?:[id number]}  For questions or updates, please contact [facility] Please consult [email or web address] for contact info under    Signed, Marcelino Duster, PA  09/27/2023 1:32 PM

## 2023-09-27 NOTE — ED Notes (Signed)
RT Note: No wheezes noted. Patient stated she is breathing ok right now and did  not feel like she wanted a breathing treatment. Patient encouraged to call out if she changes her mind and needs a breathing treatments.

## 2023-09-27 NOTE — Plan of Care (Signed)

## 2023-09-27 NOTE — ED Provider Notes (Signed)
[place] [facility] AT [facility] Provider Note   CSN: [id number] Arrival date & time: 09/27/23  1108     History  Chief Complaint  Patient presents with   Shortness of Breath    Tina George is a 77 y.o. female.  Pt is a 77 yo female with pmhx significant for htn, copd, hld, gerd, and pvd.  Pt had an aortogram and RLE angiogram with right femoropopliteal angioplasty and stenting on 11/22 by Dr. Lenell Antu.  She has not felt well since then.  She has been having episodes of cp that are becoming more frequent.  She did see her pcp last week and was told that her HR was in the 30s.  She was told to  call her cardiologist.  She did call her cardiologist (Dr. Vincent Gros) yesterday and was told to go to the ER.  Pt was reluctant to go to the ED, but came today because she had another cp that felt like an elephant was sitting on her chest.  She had been on metoprolol, but that was stopped about 3 weeks ago.  No cp now.         Home Medications Prior to Admission medications   Medication Sig Start Date End Date Taking? Authorizing Provider  albuterol (PROVENTIL HFA;VENTOLIN HFA) 108 (90 Base) MCG/ACT inhaler Inhale 1-2 puffs into the lungs as needed for wheezing or shortness of breath.    [provider]  ALPRAZolam Prudy Feeler) 0.25 MG tablet Take 0.25 mg by mouth every 6 (six) hours as needed for anxiety. 07/04/20   [provider]  Budeson-Glycopyrrol-Formoterol (BREZTRI AEROSPHERE) 160-9-4.8 MCG/ACT AERO Inhale 2 puffs into the lungs in the morning and at bedtime. Patient taking differently: Inhale 2 puffs into the lungs 2 (two) times daily as needed (respiratory issues.). 12/29/20   Martina Sinner, MD  clopidogrel (PLAVIX) 75 MG tablet Take 1 tablet (75 mg total) by mouth daily. 09/05/23 09/04/24  [name], MD  gabapentin (NEURONTIN) 300 MG capsule Take 300 mg by mouth at bedtime. 05/08/23   [provider]  ibuprofen (ADVIL,MOTRIN)  200 MG tablet Take 200 mg by mouth every 8 (eight) hours as needed for pain.    [provider]  ipratropium-albuterol (DUONEB) 0.5-2.5 (3) MG/3ML SOLN Take 3 mLs by nebulization every 4 (four) hours as needed for wheezing or shortness of breath. 08/25/20   [provider]  metoprolol succinate (TOPROL-XL) 100 MG 24 hr tablet Take 100 mg by mouth in the morning. 07/25/23   [provider]  Multiple Vitamin (MULTIVITAMIN WITH MINERALS) TABS tablet Take 1 tablet by mouth in the morning. Multivitamin for Women    [provider]  nitroGLYCERIN (NITROSTAT) 0.4 MG SL tablet Place 1 tablet (0.4 mg total) under the tongue every 5 (five) minutes as needed for chest pain. 05/30/23   Flossie Dibble, NP  PARoxetine (PAXIL) 40 MG tablet Take 40 mg by mouth every morning. 11/17/20   [provider]  rosuvastatin (CRESTOR) 20 MG tablet Take 1 tablet (20 mg total) by mouth daily. 05/30/23 08/31/25  Flossie Dibble, NP  traMADol (ULTRAM) 50 MG tablet Take 50 mg by mouth 3 (three) times daily as needed for moderate pain (pain score 4-6) or severe pain (pain score 7-10). 07/25/23   [provider]      Allergies    Aspirin, Levaquin [levofloxacin in d5w], and Codeine    Review of Systems   Review of Systems  Respiratory:  Positive for shortness of breath.   Cardiovascular:  Positive for chest pain.  All other systems reviewed and are negative.   Physical Exam Updated Vital Signs BP (!) 179/62   Pulse (!) 44   Temp 98.2 F (36.8 C)   Resp 20   Ht 5\' 7"  (1.702 m)   Wt 64.4 kg   SpO2 96%   BMI 22.24 kg/m  Physical Exam Vitals and nursing note reviewed.  Constitutional:      Appearance: She is well-developed.  HENT:     Head: Normocephalic and atraumatic.     Mouth/Throat:     Mouth: Mucous membranes are moist.     Pharynx: Oropharynx is clear.  Eyes:     Extraocular Movements: Extraocular movements intact.     Pupils: Pupils are equal,  round, and reactive to light.  Cardiovascular:     Rate and Rhythm: Regular rhythm. Bradycardia present.  Pulmonary:     Effort: Pulmonary effort is normal.     Breath sounds: Normal breath sounds.  Abdominal:     General: Bowel sounds are normal.     Palpations: Abdomen is soft.  Musculoskeletal:        General: Normal range of motion.     Cervical back: Normal range of motion and neck supple.  Skin:    General: Skin is warm.     Capillary Refill: Capillary refill takes less than 2 seconds.  Neurological:     General: No focal deficit present.     Mental Status: She is alert and oriented to person, place, and time.  Psychiatric:        Mood and Affect: Mood normal.        Behavior: Behavior normal.     ED Results / Procedures / Treatments   Labs (all labs ordered are listed, but only abnormal results are displayed) Labs Reviewed  BASIC METABOLIC PANEL - Abnormal; Notable for the following components:      Result Value   Potassium 3.3 (*)    Creatinine, Ser 1.17 (*)    Calcium 8.5 (*)    GFR, Estimated 48 (*)    All other components within normal limits  CBC - Abnormal; Notable for the following components:   RBC 3.56 (*)    Hemoglobin 10.5 (*)    HCT 33.5 (*)    All other components within normal limits  VITAMIN B12  FOLATE  IRON AND TIBC  FERRITIN  RETICULOCYTES  MAGNESIUM  TROPONIN I (HIGH SENSITIVITY)  TROPONIN I (HIGH SENSITIVITY)    EKG EKG Interpretation Date/Time:  Saturday September 27 2023 11:20:04 EST Ventricular Rate:  43 PR Interval:  148 QRS Duration:  85 QT Interval:  481 QTC Calculation: 407 R Axis:   66  Text Interpretation: Sinus bradycardia Probable anteroseptal infarct, old Since last tracing rate slower Confirmed by Jacalyn Lefevre [phone]) on 09/27/2023 11:52:25 AM  Radiology DG Chest Port 1 View Result Date: 09/27/2023 CLINICAL DATA:  Chest pain EXAM: PORTABLE CHEST 1 VIEW COMPARISON:  04/28/2022 chest radiograph. FINDINGS:  Bilateral breast prostheses noted. Stable cardiomediastinal silhouette with top-normal heart size. No pneumothorax. Slight blunting of both costophrenic angles. No overt pulmonary edema. No consolidative airspace disease. IMPRESSION: Slight blunting of both costophrenic angles, cannot exclude trace bilateral pleural effusions. Otherwise no active cardiopulmonary disease. Electronically Signed   By: Delbert Phenix M.D.   On: 09/27/2023 12:09    Procedures Procedures    Medications Ordered in ED Medications  potassium chloride SA (KLOR-CON M)  CR tablet 40 mEq (has no administration in time range)  acetaminophen (TYLENOL) tablet 650 mg (650 mg Oral Given 09/27/23 1248)    ED Course/ Medical Decision Making/ A&P                                 Medical Decision Making Amount and/or Complexity of Data Reviewed Labs: ordered. Radiology: ordered.  Risk OTC drugs. Decision regarding hospitalization.   This patient presents to the ED for concern of cp, this involves an extensive number of treatment options, and is a complaint that carries with it a high risk of complications and morbidity.  The differential diagnosis includes cardiac, bradycardia   Co morbidities that complicate the patient evaluation  htn, copd, hld, gerd, and pvd   Additional history obtained:  Additional history obtained from epic chart review External records from outside source obtained and reviewed including daughter   Lab Tests:  I Ordered, and personally interpreted labs.  The pertinent results include:  cbc with hgb 10.5 (11.6 3 weeks ago and 13.2 5 months ago), trop nl   Imaging Studies ordered:  I ordered imaging studies including cxr  I independently visualized and interpreted imaging which showed  Slight blunting of both costophrenic angles, cannot exclude trace  bilateral pleural effusions. Otherwise no active cardiopulmonary  disease.   I agree with the radiologist interpretation   Cardiac  Monitoring:  The patient was maintained on a cardiac monitor.  I personally viewed and interpreted the cardiac monitored which showed an underlying rhythm of: sb   Medicines ordered and prescription drug management:  I ordered medication including tylenol  for headache  Reevaluation of the patient after these medicines showed that the patient improved I have reviewed the patients home medicines and have made adjustments as needed  Critical Interventions:  telemetry   Consultations Obtained:  I requested consultation with the cardiologist (Dr. Anne Fu),  and discussed lab and imaging findings as well as pertinent plan -he recommends admission to the hospitalist service.  He will have EP see her in the morning for possible pacemaker placement. Pt d/w Dr. Lajuana Ripple (triad) for admission  Problem List / ED Course:  Symptomatic bradycardia:  bp is stable, but energy level has significantly decreased.  Pt is not on anything that will decrease hr.  She's been off metoprolol for 3 weeks.  Anemia:  worsening anemia for a few months.  Anemia panel ordered. Hypokalemia:  mild.  Kdur given.  Mg level ordered.   Reevaluation:  After the interventions noted above, I reevaluated the patient and found that they have :improved   Social Determinants of Health:  Lives at home   Dispostion:  After consideration of the diagnostic results and the patients response to treatment, I feel that the patent would benefit from admission.          Final Clinical Impression(s) / ED Diagnoses Final diagnoses:  Bradycardia  Chest pain, unspecified type    Rx / DC Orders ED Discharge Orders     None         Jacalyn Lefevre, MD 09/27/23 1352

## 2023-09-27 NOTE — ED Notes (Signed)
Report given to Shannon RN with [facility] 

## 2023-09-27 NOTE — H&P (Addendum)
History and Physical    DOA: 09/27/2023  PCP: Simone Curia, MD  Patient coming from: home  Chief Complaint: Weakness, told to come to ER for slow HR  HPI: Tina George is a 77 y.o. female with history h/o HTN, HLP, GERD, smoking, COPD-not on home O2, chronic leg edema, PAD who recently underwent right lower extremity PTCA presented to the ED upon advice of cardiology for further evaluation of symptomatic bradycardia.  Patient states she was feeling weak and tired the entire week.  She had a previously scheduled appointment with her PCP, Dr. Nedra Hai, on Thursday-during that visit she was noted to be significantly hypotensive and bradycardic with heart rate in the 30s which was felt to be causing her symptoms.  Patient was advised by her PCP to hold metoprolol 100 mg that she was taking and to follow-up with her cardiologist.  Burgess Estelle (Friday), patient called cardiology office who apparently reviewed her Holter monitor readings and called her back advising that it was largely unremarkable with some supraventricular ectopy episodes-longest lasting 12 seconds.  Patient was advised to come to the ED for further evaluation if her symptoms presented.  She denies any dizziness or chest pain (she did have an episode of chest heaviness past Saturday for which she took nitro but none since then) or palpitations (other than a feeling of heart pounding when she was going to the ED).  She reports ongoing right lower extremity pain and spite of intervention recently, claims compliance with medications.  She apparently is scheduled to undergo left lower extremity angiogram/intervention next week but this has been put on hold due to her current issues. ED course: Patient initially presented to [facility] where she was noted to be afebrile, heart rate 43-45, respiratory rate 15-21, BP 170/60-182/67, O2 sat 94 to 100% on room air.  Labs show WBC 10.4, hemoglobin 10.5, HCT 33.5, platelet 188, sodium 137, potassium  3.3, chloride 106, bicarb 24, BUN 17, creatinine 1.17, calcium 8.5, troponin I 12-14, BG 94.  EKG shows sinus bradycardia, normal QTc.  Case was discussed by ED physician with cardiology Dr. Clifton James who recommended admission to hospitalist service and they will consult in AM.   Review of Systems: As per HPI, otherwise review of systems negative.    Past Medical History:  Diagnosis Date   Anemia 09/20/2014   Asthma 05/06/2014   Bilateral leg edema 09/15/2020   COPD (chronic obstructive pulmonary disease) (HCC)    Daytime somnolence 09/15/2020   Dyslipidemia    GERD (gastroesophageal reflux disease)    GERD (gastroesophageal reflux disease)    High blood pressure    Hypercholesterolemia 09/20/2014   Hypokalemia    Medication management 09/15/2020   Murmur 09/15/2020   Muscle cramps    Osteoarthritis 09/20/2014   Other chest pain 09/15/2020   Pain in both lower extremities 09/15/2020   Palpitations 08/06/2023   Precordial pain 09/15/2020   Psoriasis    Snoring 09/15/2020   SOB (shortness of breath) 09/15/2020   Syncope and collapse 08/06/2023   Vitamin D deficiency 09/20/2014    Past Surgical History:  Procedure Laterality Date   ABDOMINAL AORTOGRAM W/LOWER EXTREMITY Right 09/05/2023   Procedure: ABDOMINAL AORTOGRAM W/LOWER EXTREMITY;  Surgeon: [name], MD;  Location: [facility];  Service: Cardiovascular;  Laterality: Right;   ABDOMINAL HYSTERECTOMY     APPENDECTOMY     arthroscopic shoulder surgery     BACK SURGERY     CATARACT EXTRACTION, BILATERAL  CHOLECYSTECTOMY     MASTECTOMY     MASTOIDECTOMY     PARTIAL COLECTOMY     PERIPHERAL VASCULAR INTERVENTION Right 09/05/2023   Procedure: PERIPHERAL VASCULAR INTERVENTION;  Surgeon: [name], MD;  Location: [facility];  Service: Cardiovascular;  Laterality: Right;  SFA    Social history:  reports that she has been smoking cigarettes. She has never used smokeless tobacco. She reports  that she does not drink alcohol and does not use drugs. Smokes 1/2 PPD x 20 yrs, been slowing down to quit  Allergies  Allergen Reactions   Aspirin Itching, Swelling and Other (See Comments)    Tongue swelling   Levaquin [Levofloxacin In D5w] Other (See Comments)    Severe yeast reaction.   Codeine Rash    Family History  Problem Relation Age of Onset   Pancreatic cancer Mother    Hypertension Father    Heart disease Father    Pancreatic cancer Sister    Pancreatic cancer Brother       Prior to Admission medications   Medication Sig Start Date End Date Taking? Authorizing Provider  albuterol (PROVENTIL HFA;VENTOLIN HFA) 108 (90 Base) MCG/ACT inhaler Inhale 1-2 puffs into the lungs as needed for wheezing or shortness of breath.    [provider]  ALPRAZolam Prudy Feeler) 0.25 MG tablet Take 0.25 mg by mouth every 6 (six) hours as needed for anxiety. 07/04/20   [provider]  Budeson-Glycopyrrol-Formoterol (BREZTRI AEROSPHERE) 160-9-4.8 MCG/ACT AERO Inhale 2 puffs into the lungs in the morning and at bedtime. Patient taking differently: Inhale 2 puffs into the lungs 2 (two) times daily as needed (respiratory issues.). 12/29/20   Martina Sinner, MD  clopidogrel (PLAVIX) 75 MG tablet Take 1 tablet (75 mg total) by mouth daily. 09/05/23 09/04/24  [name], MD  gabapentin (NEURONTIN) 300 MG capsule Take 300 mg by mouth at bedtime. 05/08/23   [provider]  ibuprofen (ADVIL,MOTRIN) 200 MG tablet Take 200 mg by mouth every 8 (eight) hours as needed for pain.    [provider]  ipratropium-albuterol (DUONEB) 0.5-2.5 (3) MG/3ML SOLN Take 3 mLs by nebulization every 4 (four) hours as needed for wheezing or shortness of breath. 08/25/20   [provider]  metoprolol succinate (TOPROL-XL) 100 MG 24 hr tablet Take 100 mg by mouth in the morning. 07/25/23   [provider]  Multiple Vitamin (MULTIVITAMIN WITH MINERALS) TABS tablet Take  1 tablet by mouth in the morning. Multivitamin for Women    [provider]  nitroGLYCERIN (NITROSTAT) 0.4 MG SL tablet Place 1 tablet (0.4 mg total) under the tongue every 5 (five) minutes as needed for chest pain. 05/30/23   Flossie Dibble, NP  PARoxetine (PAXIL) 40 MG tablet Take 40 mg by mouth every morning. 11/17/20   [provider]  rosuvastatin (CRESTOR) 20 MG tablet Take 1 tablet (20 mg total) by mouth daily. 05/30/23 08/31/25  Flossie Dibble, NP  traMADol (ULTRAM) 50 MG tablet Take 50 mg by mouth 3 (three) times daily as needed for moderate pain (pain score 4-6) or severe pain (pain score 7-10). 07/25/23   [provider]    Physical Exam: Vitals:   09/27/23 1345 09/27/23 1400 09/27/23 1522 09/27/23 1532  BP: (!) 182/67   (!) 170/60  Pulse: (!) 45 (!) 44    Resp: (!) 21 15    Temp:    98 F (36.7 C)  TempSrc:   Oral Oral  SpO2: 94% 100%    Weight:      Height:        Constitutional: NAD, calm, comfortable Eyes: PERRL, lids and conjunctivae normal ENMT: Mucous membranes are moist. Posterior pharynx clear of any exudate or lesions.Normal dentition.  Neck: normal, supple, no masses, no thyromegaly Respiratory: clear to auscultation bilaterally, no wheezing, no crackles. Normal respiratory effort. No accessory muscle use.  Cardiovascular: Regular rate and rhythm, no murmurs / rubs / gallops. No extremity edema. 2+ pedal pulses. No carotid bruits.  Abdomen: no tenderness, no masses palpated. No hepatosplenomegaly. Bowel sounds positive.  Musculoskeletal: no clubbing / cyanosis. No joint deformity upper and lower extremities. Good ROM, no contractures. Normal muscle tone.  Neurologic: CN 2-12 grossly intact. Sensation intact, DTR normal. Strength 5/5 in all 4.  Psychiatric: Normal judgment and insight. Alert and oriented x 3. Normal mood.  SKIN/catheters: no rashes, lesions, ulcers. No induration  Labs on Admission: I have personally reviewed  following labs and imaging studies  CBC: Recent Labs  Lab 09/27/23 1123 09/27/23 1540  WBC 10.4 10.7*  HGB 10.5* 10.9*  HCT 33.5* 34.0*  MCV 94.1 92.4  PLT 188 192   Basic Metabolic Panel: Recent Labs  Lab 09/27/23 1123 09/27/23 1540  NA 137  --   K 3.3*  --   CL 106  --   CO2 24  --   GLUCOSE 94  --   BUN 17  --   CREATININE 1.17* 1.30*  CALCIUM 8.5*  --    GFR: Estimated Creatinine Clearance: 35.8 mL/min (A) (by C-G formula based on SCr of 1.3 mg/dL (H)). Recent Labs  Lab 09/27/23 1123 09/27/23 1540  WBC 10.4 10.7*   Liver Function Tests: No results for input(s): "AST", "ALT", "ALKPHOS", "BILITOT", "PROT", "ALBUMIN" in the last 168 hours. No results for input(s): "LIPASE", "AMYLASE" in the last 168 hours. No results for input(s): "AMMONIA" in the last 168 hours. Coagulation Profile: No results for input(s): "INR", "PROTIME" in the last 168 hours. Cardiac Enzymes: No results for input(s): "CKTOTAL", "CKMB", "CKMBINDEX", "TROPONINI" in the last 168 hours. BNP (last 3 results) Recent Labs    04/22/23 1445 05/26/23 1307  PROBNP 1,074* 847*   HbA1C: No results for input(s): "HGBA1C" in the last 72 hours. CBG: No results for input(s): "GLUCAP" in the last 168 hours. Lipid Profile: No results for input(s): "CHOL", "HDL", "LDLCALC", "TRIG", "CHOLHDL", "LDLDIRECT" in the last 72 hours. Thyroid Function Tests: No results for input(s): "TSH", "T4TOTAL", "FREET4", "T3FREE", "THYROIDAB" in the last 72 hours. Anemia Panel: Recent Labs    09/27/23 1123  RETICCTPCT 1.4   Urine analysis: No results found for: "COLORURINE", "APPEARANCEUR", "LABSPEC", "PHURINE", "GLUCOSEU", "HGBUR", "BILIRUBINUR", "KETONESUR", "PROTEINUR", "UROBILINOGEN", "NITRITE", "LEUKOCYTESUR"  Radiological Exams on Admission: Personally reviewed  DG Chest Port 1 View Result Date: 09/27/2023 CLINICAL DATA:  Chest pain EXAM: PORTABLE CHEST 1 VIEW COMPARISON:  04/28/2022 chest radiograph.  FINDINGS: Bilateral breast prostheses noted. Stable cardiomediastinal silhouette with top-normal heart size. No pneumothorax. Slight blunting of both costophrenic angles. No overt pulmonary edema. No consolidative airspace disease. IMPRESSION: Slight blunting of both costophrenic angles, cannot exclude trace bilateral pleural effusions. Otherwise no active cardiopulmonary disease. Electronically Signed   By: Delbert Phenix M.D.   On: 09/27/2023 12:09    EKG: Independently reviewed. Sinus bradycardia -with HR 43, QTC 407 msec     Assessment and Plan:   Principal Problem:   Symptomatic bradycardia Active Problems:   COPD (chronic obstructive pulmonary disease) (HCC)  Dyslipidemia   GERD (gastroesophageal reflux disease)   Muscle cramps   Bilateral leg edema   Pain in both lower extremities   Asthma    1.Symptomatic bradycardia: Patient with HR 32 and hypotension in recent clinic visit (past Thursday), metoprolol held at that time. Patient with better hemodynamics now with heart rate in 40s but still symptomatic-mainly fatigue and weakness. Per Cardiology, plan for EP consult with Dr Lalla Brothers in am. Meanwhile will check orthostatics as alternate cause for her symptoms as pt reports being significantly hypotensive on Thursday and pt also on multiple psych meds.  Will hold tramadol and other home meds that could possibly contribute to bradycardia.  Will check TSH.  No evidence of heart blocks on EKG or telemetry so far.  2.  Hypertension: Patient previously on metoprolol and this has been held.  Currently hypertensive at rest.  Will check orthostatics.  Will avoid AV blockers.  Patient previously on HCTZ can add the same to help with her leg swellings as well.  She apparently takes Lasix as needed when her leg swellings worsen.  Hydralazine as needed ordered.    3.  PAD status post recent PTCA: Patient complaining of persistent right leg pain/cramps in spite of recent intervention.  Patient  advised to quit smoking completely and encourage compliance with medications.  She is already on Neurontin at bedtime.  Can consider informing vascular of her admission and possibly evaluation while here for persistent right lower extremity pain/scheduling left lower extremity intervention.  4.  Chronic venous stasis: Could be contributing to leg swellings and orthostasis possibly.  Problem list patient has had syncope and postural lightheadedness in the past.  HCTZ as described above.  Hold off on support stockings given concern for problem #3 and claudication pain  5.  Anxiety/depression: Resume home meds-Paxil and Xanax as needed.  Also on Neurontin.  Can consider reducing dosage if felt to be contributing to bradycardia.  6.  Asthma/COPD: Resume MDI as needed.    7.  Hyperlipidemia, GERD: Resume home medications  8. Tobacco use: Pt admits to smoking -a pack lasts 2 weeks.  She states she has been trying to quit and has cut down to couple of cigarettes a day, is on a nicotine patch on currently.  Counseled to quit smoking and advised of stent thrombosis and other cardiac risks with tobacco use.  Patient verbalized understanding and stated she is motivated and working on stopping altogether.  DVT prophylaxis: Lovenox   Code Status: Full code    .Health care proxy would be her son and daughter  Patient/Family Communication: Discussed with patient and all questions answered to satisfaction.  Consults called: Cardiology Dr. Theadore Nan Dr. Lalla Brothers Admission status :Patient will be admitted under OBSERVATION status.The patient's presenting symptoms, physical exam findings, and initial radiographic and laboratory data in the context of their medical condition is felt to place them at low risk for further clinical deterioration. Furthermore, it is anticipated that the patient will be medically stable for discharge from the hospital within 2 midnights of hospital stay.       Alessandra Bevels  MD [facility] Pager in [place]  If 7PM-7AM, please contact night-coverage [email or web address]   09/27/2023, 4:48 PM

## 2023-09-27 NOTE — ED Notes (Signed)
Called [facility] for transport 

## 2023-09-27 NOTE — ED Triage Notes (Signed)
Pt sent by her cardiologist for shortness of breath. Pt has also had chest tightness and pain. Last Saturday she took 2 Nitroglycerin because she felt like "an elephant was sitting on her chest." Pt having a little bit SOB currently.  Robina Ade, RN

## 2023-09-28 DIAGNOSIS — Z886 Allergy status to analgesic agent status: Secondary | ICD-10-CM | POA: Diagnosis not present

## 2023-09-28 DIAGNOSIS — Z885 Allergy status to narcotic agent status: Secondary | ICD-10-CM | POA: Diagnosis not present

## 2023-09-28 DIAGNOSIS — K219 Gastro-esophageal reflux disease without esophagitis: Secondary | ICD-10-CM | POA: Diagnosis present

## 2023-09-28 DIAGNOSIS — T82856A Stenosis of peripheral vascular stent, initial encounter: Secondary | ICD-10-CM | POA: Diagnosis present

## 2023-09-28 DIAGNOSIS — Z90710 Acquired absence of both cervix and uterus: Secondary | ICD-10-CM | POA: Diagnosis not present

## 2023-09-28 DIAGNOSIS — E7800 Pure hypercholesterolemia, unspecified: Secondary | ICD-10-CM | POA: Diagnosis present

## 2023-09-28 DIAGNOSIS — Z7951 Long term (current) use of inhaled steroids: Secondary | ICD-10-CM | POA: Diagnosis not present

## 2023-09-28 DIAGNOSIS — M79604 Pain in right leg: Secondary | ICD-10-CM | POA: Diagnosis not present

## 2023-09-28 DIAGNOSIS — R001 Bradycardia, unspecified: Secondary | ICD-10-CM

## 2023-09-28 DIAGNOSIS — F32A Depression, unspecified: Secondary | ICD-10-CM | POA: Diagnosis present

## 2023-09-28 DIAGNOSIS — Z881 Allergy status to other antibiotic agents status: Secondary | ICD-10-CM | POA: Diagnosis not present

## 2023-09-28 DIAGNOSIS — Z79899 Other long term (current) drug therapy: Secondary | ICD-10-CM | POA: Diagnosis not present

## 2023-09-28 DIAGNOSIS — D649 Anemia, unspecified: Secondary | ICD-10-CM | POA: Diagnosis present

## 2023-09-28 DIAGNOSIS — F17210 Nicotine dependence, cigarettes, uncomplicated: Secondary | ICD-10-CM | POA: Diagnosis present

## 2023-09-28 DIAGNOSIS — I495 Sick sinus syndrome: Secondary | ICD-10-CM | POA: Diagnosis present

## 2023-09-28 DIAGNOSIS — I10 Essential (primary) hypertension: Secondary | ICD-10-CM | POA: Diagnosis present

## 2023-09-28 DIAGNOSIS — J4489 Other specified chronic obstructive pulmonary disease: Secondary | ICD-10-CM | POA: Diagnosis present

## 2023-09-28 DIAGNOSIS — F419 Anxiety disorder, unspecified: Secondary | ICD-10-CM | POA: Diagnosis present

## 2023-09-28 DIAGNOSIS — Z23 Encounter for immunization: Secondary | ICD-10-CM | POA: Diagnosis not present

## 2023-09-28 DIAGNOSIS — Y831 Surgical operation with implant of artificial internal device as the cause of abnormal reaction of the patient, or of later complication, without mention of misadventure at the time of the procedure: Secondary | ICD-10-CM | POA: Diagnosis present

## 2023-09-28 DIAGNOSIS — I878 Other specified disorders of veins: Secondary | ICD-10-CM | POA: Diagnosis present

## 2023-09-28 DIAGNOSIS — R509 Fever, unspecified: Secondary | ICD-10-CM | POA: Diagnosis not present

## 2023-09-28 DIAGNOSIS — Z7902 Long term (current) use of antithrombotics/antiplatelets: Secondary | ICD-10-CM | POA: Diagnosis not present

## 2023-09-28 DIAGNOSIS — R519 Headache, unspecified: Secondary | ICD-10-CM | POA: Diagnosis not present

## 2023-09-28 DIAGNOSIS — Z1152 Encounter for screening for COVID-19: Secondary | ICD-10-CM | POA: Diagnosis not present

## 2023-09-28 DIAGNOSIS — I739 Peripheral vascular disease, unspecified: Secondary | ICD-10-CM | POA: Diagnosis present

## 2023-09-28 DIAGNOSIS — E876 Hypokalemia: Secondary | ICD-10-CM | POA: Diagnosis present

## 2023-09-28 HISTORY — DX: Bradycardia, unspecified: R00.1

## 2023-09-28 LAB — URINALYSIS, W/ REFLEX TO CULTURE (INFECTION SUSPECTED)
Bilirubin Urine: NEGATIVE
Glucose, UA: NEGATIVE mg/dL
Ketones, ur: NEGATIVE mg/dL
Nitrite: NEGATIVE
Protein, ur: NEGATIVE mg/dL
Specific Gravity, Urine: 1.011 (ref 1.005–1.030)
pH: 5 (ref 5.0–8.0)

## 2023-09-28 LAB — RESP PANEL BY RT-PCR (RSV, FLU A&B, COVID)  RVPGX2
Influenza A by PCR: NEGATIVE
Influenza B by PCR: NEGATIVE
Resp Syncytial Virus by PCR: NEGATIVE
SARS Coronavirus 2 by RT PCR: NEGATIVE

## 2023-09-28 MED ORDER — INFLUENZA VAC A&B SURF ANT ADJ 0.5 ML IM SUSY
0.5000 mL | PREFILLED_SYRINGE | INTRAMUSCULAR | Status: DC
  Filled 2023-09-28: qty 0.5

## 2023-09-28 NOTE — Plan of Care (Signed)

## 2023-09-28 NOTE — Progress Notes (Signed)
PROGRESS NOTE    Tina George  BMW:[id number] DOB: 25-May-1946 DOA: 09/27/2023 PCP: Simone Curia, MD  Chief Complaint  Patient presents with   Shortness of Breath    Brief Narrative:   Tina George is Tina George 77 y.o. female with history h/o HTN, HLP, GERD, smoking, COPD-not on home O2, chronic leg edema, PAD who recently underwent right lower extremity PTCA presented to the ED upon advice of cardiology for further evaluation of symptomatic bradycardia.    Assessment & Plan:   Principal Problem:   Symptomatic bradycardia Active Problems:   COPD (chronic obstructive pulmonary disease) (HCC)   Dyslipidemia   GERD (gastroesophageal reflux disease)   Muscle cramps   Bilateral leg edema   Pain in both lower extremities   Asthma  1.Symptomatic bradycardia: Patient with HR 32 and hypotension in recent clinic visit (past Thursday), metoprolol held at that time. Patient with better hemodynamics now with heart rate in 40s but still symptomatic-mainly fatigue and weakness. Per Cardiology, plan for EP consult with Dr Lalla Brothers in 12/15.  Awaiting orthostatics  TSH wnl  No evidence of heart block on EKG or telemetry so far.   # Fever No localizing symptoms, will check RVP  CXR with blunted costophrenic angles UA with reflex culture pending  2.  Hypertension: Patient previously on metoprolol and this has been held due to bradycardia.   Currently hypertensive at rest.   Will check orthostatics.   Will avoid AV nodal blockers.  Consider restarting hydrochlorothiazide (past BP medicine) if BP persistently elevated   3.  PAD status post recent PTCA:  S/p R femoropopoliteal angioplasty and stenting on 11/22 Has plans for LLE angiogram this week? I don't see on schedule, apparently currently on hold? Patient complaining of persistent right leg pain/cramps in spite of recent intervention.   Patient advised to quit smoking completely and encourage compliance with medications.   Continue neurontin.   Consider discussion with vascular if persistent symptoms   4.  Chronic venous stasis:  noted   5.  Anxiety/depression: Resume home meds-Paxil and Xanax as needed.  Also on Neurontin.     6.  Asthma/COPD: Resume MDI as needed.     7.  Hyperlipidemia, GERD: Resume home medications   8. Tobacco use: encourage cessation    DVT prophylaxis: lovenox Code Status: full Family Communication: none Disposition:   Status is: Observation The patient remains OBS appropriate and will d/c before 2 midnights.   Consultants:  cardiology  Procedures:  none  Antimicrobials:  Anti-infectives (From admission, onward)    None       Subjective: No complaints Notes issues for months with "dizziness" and no energy when she's up and about - feels like someone/something is squeezing her heart - not everyday.  2-3x/week.  Not always positional, but usually with standing/activity.   Objective: Vitals:   09/27/23 1954 09/27/23 2319 09/28/23 0420 09/28/23 0807  BP: 136/65 (!) 153/57 (!) 157/63 (!) 157/60  Pulse: (!) 46 (!) 50 (!) 51 (!) 43  Resp: 20 18 19 18   Temp: 98.1 F (36.7 C) 99 F (37.2 C) (!) 100.6 F (38.1 C) 97.7 F (36.5 C)  TempSrc: Oral Tympanic Oral Oral  SpO2: 100% 97% 93% 93%  Weight:      Height:       No intake or output data in the 24 hours ending 09/28/23 1023 Filed Weights   09/27/23 1117  Weight: 64.4 kg    Examination:  General exam: Appears calm and comfortable  Respiratory system: unlabored Cardiovascular system: sinus brady, regular Gastrointestinal system: Abdomen is nondistended, soft and nontender.  Central nervous system: Alert and oriented. No focal neurological deficits. Extremities: no LEE  Data Reviewed: I have personally reviewed following labs and imaging studies  CBC: Recent Labs  Lab 09/27/23 1123 09/27/23 1540  WBC 10.4 10.7*  HGB 10.5* 10.9*  HCT 33.5* 34.0*  MCV 94.1 92.4  PLT 188 192    Basic Metabolic Panel: Recent  Labs  Lab 09/27/23 1123 09/27/23 1540  NA 137  --   K 3.3*  --   CL 106  --   CO2 24  --   GLUCOSE 94  --   BUN 17  --   CREATININE 1.17* 1.30*  CALCIUM 8.5*  --     GFR: Estimated Creatinine Clearance: 35.8 mL/min ([facility]) (by C-G formula based on SCr of 1.3 mg/dL (H)).  Liver Function Tests: No results for input(s): "AST", "ALT", "ALKPHOS", "BILITOT", "PROT", "ALBUMIN" in the last 168 hours.  CBG: No results for input(s): "GLUCAP" in the last 168 hours.   No results found for this or any previous visit (from the past 240 hours).       Radiology Studies: DG Chest Port 1 View Result Date: 09/27/2023 CLINICAL DATA:  Chest pain EXAM: PORTABLE CHEST 1 VIEW COMPARISON:  04/28/2022 chest radiograph. FINDINGS: Bilateral breast prostheses noted. Stable cardiomediastinal silhouette with top-normal heart size. No pneumothorax. Slight blunting of both costophrenic angles. No overt pulmonary edema. No consolidative airspace disease. IMPRESSION: Slight blunting of both costophrenic angles, cannot exclude trace bilateral pleural effusions. Otherwise no active cardiopulmonary disease. Electronically Signed   By: Delbert Phenix M.D.   On: 09/27/2023 12:09        Scheduled Meds:  clopidogrel  75 mg Oral Daily   enoxaparin (LOVENOX) injection  40 mg Subcutaneous Q24H   gabapentin  300 mg Oral QHS   hydrochlorothiazide  12.5 mg Oral Daily   multivitamin with minerals  1 tablet Oral q AM   PARoxetine  40 mg Oral q morning   rosuvastatin  20 mg Oral Daily   Continuous Infusions:   LOS: 0 days    Time spent: over 30 min    Lacretia Nicks, MD [facility]   To contact the attending provider between 7A-7P or the covering provider during after hours 7P-7A, please log into the web site [email or web address] and access using universal [place] password for that web site. If you do not have the password, please call the hospital operator.  09/28/2023, 10:23 AM

## 2023-09-28 NOTE — Progress Notes (Signed)
   09/28/23 1800  Mobility  Activity Ambulated with assistance in hallway  Level of Assistance Standby assist, set-up cues, supervision of patient - no hands on  Assistive Device None  Distance Ambulated (ft) 280 ft   Starting vitals: BP 152/70, HR 47 Post ambulation: BP 158/67, HR 53  Pt complained of SOB during ambulation. O2 sats 90-97% on RA. HR remained between 46-55.

## 2023-09-28 NOTE — Care Management Obs Status (Signed)
MEDICARE OBSERVATION STATUS NOTIFICATION   Patient Details  Name: Tina George MRN: [id number] Date of Birth: December 05, 1945   Medicare Observation Status Notification Given:  Yes    Ronny Bacon, RN 09/28/2023, 1:21 PM

## 2023-09-28 NOTE — Consult Note (Signed)
   Electrophysiology Consultation   Patient ID: MARYBETH ZUMBRUNNEN MRN: [id number]; DOB: 1946/10/01  Admit date: 09/27/2023 Date of Consult: 09/28/2023  PCP:  Simone Curia, MD   History of Present Illness:   Ms. Seaton is a 77 yo woman who I am seeing today for an evaluation of bradycardia at the request of Dr Lowell Guitar. The patietn has a history of HTN, HLD, COPD, PAD s/p recent RLE intervention. She went to the ER yesterday with 1 week of fatigue. She saw her primary care physician Thursday. At that appt, her HR was in the 30s and she was instructed to hold her metoprolol.   Past medical, surgical, social and family history reviewed.  ROS:  Please see the history of present illness.  All other ROS reviewed and negative.     Physical Exam/Data:   Vitals:   09/27/23 1954 09/27/23 2319 09/28/23 0420 09/28/23 0807  BP: 136/65 (!) 153/57 (!) 157/63 (!) 157/60  Pulse: (!) 46 (!) 50 (!) 51 (!) 43  Resp: 20 18 19 18   Temp: 98.1 F (36.7 C) 99 F (37.2 C) (!) 100.6 F (38.1 C) 97.7 F (36.5 C)  TempSrc: Oral Tympanic Oral Oral  SpO2: 100% 97% 93% 93%  Weight:      Height:        General:  Well nourished, well developed, in no acute distress Cardiac:  normal S1, S2; RRR; no murmur  Lungs:  clear to auscultation bilaterally, no wheezing, rhonchi or rales  Psych:  Normal affect   EKG:  The EKG was personally reviewed and demonstrates:  sinus bradycardia with HR 43  08/06/2023 ECG shows sinus tach with PVC 04/22/2023 ECG shows sinus rhythm     Telemetry:  Telemetry was personally reviewed and demonstrates:  sinus rhythm. Average rate mid 40s. Some movement into the mid/upper 50s. No av block  08/2023 Echo - EF 60, RV normal, no significant valve disease    Assessment and Plan:   Ms Byerly is a 77yo woman who presented to the hospital with 1 week of fatigue and was found to be bradycardic. No syncope. Her more significant bradycardia was in the setting of metoprolol which was  stopped 3-4 days ago.  #Symptomatic bradycardia #Sinus node dysfunction Improving off metoprolol. No evidence of conduction system disease. This AM during interview rates in the mid 50s. I have asked her to walk the halls today to assess her chronotropic response. If she has intact chronotropy, plan to discharge with outpatient follow up. If poor chronotropic response to exertion, will need PPM. We discussed both possibilities this AM.  Sheria Lang T. Lalla Brothers, MD, [facility], [facility] Cardiac Electrophysiology

## 2023-09-29 ENCOUNTER — Encounter (HOSPITAL_COMMUNITY)
Admission: EM | Disposition: A | Discharge status: Home / Self Care | Payer: Self-pay | Source: Home / Self Care | Attending: Internal Medicine

## 2023-09-29 ENCOUNTER — Inpatient Hospital Stay (HOSPITAL_COMMUNITY): Payer: Medicare Other

## 2023-09-29 ENCOUNTER — Other Ambulatory Visit: Payer: Self-pay

## 2023-09-29 DIAGNOSIS — M79604 Pain in right leg: Secondary | ICD-10-CM

## 2023-09-29 DIAGNOSIS — R001 Bradycardia, unspecified: Secondary | ICD-10-CM | POA: Diagnosis not present

## 2023-09-29 DIAGNOSIS — I495 Sick sinus syndrome: Secondary | ICD-10-CM

## 2023-09-29 HISTORY — PX: PACEMAKER IMPLANT: EP1218

## 2023-09-29 LAB — CBC WITH DIFFERENTIAL/PLATELET
Abs Immature Granulocytes: 0.03 10*3/uL (ref 0.00–0.07)
Basophils Absolute: 0 10*3/uL (ref 0.0–0.1)
Basophils Relative: 0 %
Eosinophils Absolute: 0.2 10*3/uL (ref 0.0–0.5)
Eosinophils Relative: 3 %
HCT: 29.3 % — ABNORMAL LOW (ref 36.0–46.0)
Hemoglobin: 9.3 g/dL — ABNORMAL LOW (ref 12.0–15.0)
Immature Granulocytes: 0 %
Lymphocytes Relative: 24 %
Lymphs Abs: 1.9 10*3/uL (ref 0.7–4.0)
MCH: 29.4 pg (ref 26.0–34.0)
MCHC: 31.7 g/dL (ref 30.0–36.0)
MCV: 92.7 fL (ref 80.0–100.0)
Monocytes Absolute: 1 10*3/uL (ref 0.1–1.0)
Monocytes Relative: 13 %
Neutro Abs: 4.6 10*3/uL (ref 1.7–7.7)
Neutrophils Relative %: 60 %
Platelets: 161 10*3/uL (ref 150–400)
RBC: 3.16 MIL/uL — ABNORMAL LOW (ref 3.87–5.11)
RDW: 14.4 % (ref 11.5–15.5)
WBC: 7.7 10*3/uL (ref 4.0–10.5)
nRBC: 0 % (ref 0.0–0.2)

## 2023-09-29 LAB — SURGICAL PCR SCREEN
MRSA, PCR: NEGATIVE
Staphylococcus aureus: NEGATIVE

## 2023-09-29 LAB — COMPREHENSIVE METABOLIC PANEL
ALT: 9 U/L (ref 0–44)
AST: 11 U/L — ABNORMAL LOW (ref 15–41)
Albumin: 2.5 g/dL — ABNORMAL LOW (ref 3.5–5.0)
Alkaline Phosphatase: 63 U/L (ref 38–126)
Anion gap: 8 (ref 5–15)
BUN: 15 mg/dL (ref 8–23)
CO2: 25 mmol/L (ref 22–32)
Calcium: 8.7 mg/dL — ABNORMAL LOW (ref 8.9–10.3)
Chloride: 108 mmol/L (ref 98–111)
Creatinine, Ser: 1.38 mg/dL — ABNORMAL HIGH (ref 0.44–1.00)
GFR, Estimated: 40 mL/min — ABNORMAL LOW (ref >60)
Glucose, Bld: 100 mg/dL — ABNORMAL HIGH (ref 70–99)
Potassium: 3.5 mmol/L (ref 3.5–5.1)
Sodium: 141 mmol/L (ref 135–145)
Total Bilirubin: 0.7 mg/dL (ref <1.2)
Total Protein: 5.4 g/dL — ABNORMAL LOW (ref 6.5–8.1)

## 2023-09-29 LAB — MAGNESIUM: Magnesium: 2 mg/dL (ref 1.7–2.4)

## 2023-09-29 LAB — PHOSPHORUS: Phosphorus: 4 mg/dL (ref 2.5–4.6)

## 2023-09-29 LAB — VAS US ABI WITH/WO TBI
Left ABI: 0.58
Right ABI: 0.38

## 2023-09-29 SURGERY — PACEMAKER IMPLANT

## 2023-09-29 MED ORDER — ACETAMINOPHEN 325 MG PO TABS
325.0000 mg | ORAL_TABLET | ORAL | Status: DC | PRN
  Administered 2023-09-29 – 2023-09-30 (×3): 650 mg via ORAL
  Filled 2023-09-29 (×3): qty 2

## 2023-09-29 MED ORDER — LIDOCAINE HCL (PF) 1 % IJ SOLN
INTRAMUSCULAR | Status: DC | PRN
  Administered 2023-09-29: 50 mL
  Administered 2023-09-29: 60 mL

## 2023-09-29 MED ORDER — MIDAZOLAM HCL 5 MG/5ML IJ SOLN
INTRAMUSCULAR | Status: DC | PRN
  Administered 2023-09-29 (×4): 1 mg via INTRAVENOUS

## 2023-09-29 MED ORDER — SODIUM CHLORIDE 0.9 % IV SOLN
INTRAVENOUS | Status: AC
  Administered 2023-09-29: 80 mg
  Filled 2023-09-29: qty 2

## 2023-09-29 MED ORDER — SODIUM CHLORIDE 0.9 % IV SOLN: 250.0000 mL | INTRAVENOUS | Status: DC

## 2023-09-29 MED ORDER — SODIUM CHLORIDE 0.9% FLUSH: 3.0000 mL | INTRAVENOUS | Status: DC | PRN

## 2023-09-29 MED ORDER — CEFAZOLIN SODIUM-DEXTROSE 1-4 GM/50ML-% IV SOLN
1.0000 g | Freq: Four times a day (QID) | INTRAVENOUS | Status: DC
  Filled 2023-09-29 (×2): qty 50

## 2023-09-29 MED ORDER — CHLORHEXIDINE GLUCONATE 4 % EX SOLN: 60.0000 mL | Freq: Once | CUTANEOUS | Status: DC

## 2023-09-29 MED ORDER — SODIUM CHLORIDE 0.9 % IV SOLN
INTRAVENOUS | Status: DC
Start: 2023-09-29 — End: 2023-09-29

## 2023-09-29 MED ORDER — CHLORHEXIDINE GLUCONATE 4 % EX SOLN
60.0000 mL | Freq: Once | CUTANEOUS | Status: AC
  Administered 2023-09-29: 4 via TOPICAL

## 2023-09-29 MED ORDER — SODIUM CHLORIDE 0.9% FLUSH
3.0000 mL | Freq: Two times a day (BID) | INTRAVENOUS | Status: DC
  Administered 2023-09-29 – 2023-09-30 (×2): 3 mL via INTRAVENOUS

## 2023-09-29 MED ORDER — CEFAZOLIN SODIUM-DEXTROSE 2-4 GM/100ML-% IV SOLN
2.0000 g | INTRAVENOUS | Status: AC
  Filled 2023-09-29: qty 100

## 2023-09-29 MED ORDER — FENTANYL CITRATE (PF) 100 MCG/2ML IJ SOLN
INTRAMUSCULAR | Status: AC
  Filled 2023-09-29: qty 2

## 2023-09-29 MED ORDER — CEFAZOLIN SODIUM-DEXTROSE 1-4 GM/50ML-% IV SOLN
1.0000 g | Freq: Two times a day (BID) | INTRAVENOUS | Status: DC
  Administered 2023-09-29: 1 g via INTRAVENOUS
  Filled 2023-09-29 (×3): qty 50

## 2023-09-29 MED ORDER — LIDOCAINE HCL 1 % IJ SOLN
INTRAMUSCULAR | Status: AC
  Filled 2023-09-29: qty 60

## 2023-09-29 MED ORDER — SODIUM CHLORIDE 0.9 % IV SOLN
80.0000 mg | INTRAVENOUS | Status: AC
  Filled 2023-09-29: qty 2

## 2023-09-29 MED ORDER — SODIUM CHLORIDE 0.9 % IV SOLN: INTRAVENOUS | Status: DC

## 2023-09-29 MED ORDER — MIDAZOLAM HCL 5 MG/5ML IJ SOLN
INTRAMUSCULAR | Status: AC
  Filled 2023-09-29: qty 5

## 2023-09-29 MED ORDER — FENTANYL CITRATE (PF) 100 MCG/2ML IJ SOLN
INTRAMUSCULAR | Status: DC | PRN
  Administered 2023-09-29 (×4): 12.5 ug via INTRAVENOUS

## 2023-09-29 MED ORDER — HEPARIN (PORCINE) IN NACL 1000-0.9 UT/500ML-% IV SOLN
INTRAVENOUS | Status: DC | PRN
  Administered 2023-09-29: 500 mL

## 2023-09-29 MED ORDER — CEFAZOLIN SODIUM-DEXTROSE 2-4 GM/100ML-% IV SOLN
INTRAVENOUS | Status: AC
  Administered 2023-09-29: 2 g via INTRAVENOUS
  Filled 2023-09-29: qty 100

## 2023-09-29 SURGICAL SUPPLY — 8 items
CABLE SURGICAL S-101-97-12 (CABLE) ×1 IMPLANT
LEAD INGEVITY 7840 45 (Lead) IMPLANT
LEAD INGEVITY 7841 52 (Lead) IMPLANT
PACEMAKER ACCOLADE DR-EL (Pacemaker) IMPLANT
PAD DEFIB RADIO PHYSIO CONN (PAD) ×1 IMPLANT
SHEATH 7FR PRELUDE SNAP 13 (SHEATH) IMPLANT
TRAY PACEMAKER INSERTION (PACKS) ×1 IMPLANT
WIRE MICRO SET SILHO 5FR 7 (SHEATH) IMPLANT

## 2023-09-29 NOTE — Consult Note (Addendum)
[facility]   ASSESSMENT / PLAN: Tina George is a 77 y.o. female status post right femoropopliteal angioplasty and stenting 09/05/23 for claudication.  Recommend:  Abstinence from all tobacco products. Blood glucose control with goal A1c < 7%. Blood pressure control with goal blood pressure < 140/90 mmHg. Lipid reduction therapy with goal LDL-C <100 mg/dL Aspirin 81mg  PO QD.  Clopidogrel 75mg  PO QD. Atorvastatin 40-80mg  PO QD (or other "high intensity" statin therapy).  Patient's stenting occluded by duplex / ABI. No limb threatening symptoms at the moment. No role for intervention at this time with her symptomatic bradycardia. Will allow her to recover from a cardiac standpoint and follow up with me as an outpatient.  CHIEF COMPLAINT: right groin pain after stenting  HISTORY OF PRESENT ILLNESS: Tina George is a 77 y.o. female admitted to the hospital for evaluation of symptomatic bradycardia.  The patient reports poor exercise tolerance and low energy levels.  Her heart rate has been as low as the 30s.  Her blood pressure has been depressed as well.  Workup has been initiated and she is told me she is likely going to get a pacemaker.  The patient has noticed persistent right proximal thigh discomfort since stenting procedure 09/05/2023.  This is not severe, but noticeable.  The patient reports no problems with walking with the right leg.  She does not have any kind of rest pain symptoms in the right leg.  She has no ulcers on the right foot.   Past Medical History:  Diagnosis Date   Anemia 09/20/2014   Asthma 05/06/2014   Bilateral leg edema 09/15/2020   COPD (chronic obstructive pulmonary disease) (HCC)    Daytime somnolence 09/15/2020   Dyslipidemia    GERD (gastroesophageal reflux disease)    GERD (gastroesophageal reflux disease)    High blood pressure    Hypercholesterolemia 09/20/2014   Hypokalemia    Medication management 09/15/2020    Murmur 09/15/2020   Muscle cramps    Osteoarthritis 09/20/2014   Other chest pain 09/15/2020   Pain in both lower extremities 09/15/2020   Palpitations 08/06/2023   Precordial pain 09/15/2020   Psoriasis    Snoring 09/15/2020   SOB (shortness of breath) 09/15/2020   Syncope and collapse 08/06/2023   Vitamin D deficiency 09/20/2014    Past Surgical History:  Procedure Laterality Date   ABDOMINAL AORTOGRAM W/LOWER EXTREMITY Right 09/05/2023   Procedure: ABDOMINAL AORTOGRAM W/LOWER EXTREMITY;  Surgeon: [name], MD;  Location: [facility];  Service: Cardiovascular;  Laterality: Right;   ABDOMINAL HYSTERECTOMY     APPENDECTOMY     arthroscopic shoulder surgery     BACK SURGERY     CATARACT EXTRACTION, BILATERAL     CHOLECYSTECTOMY     MASTECTOMY     MASTOIDECTOMY     PARTIAL COLECTOMY     PERIPHERAL VASCULAR INTERVENTION Right 09/05/2023   Procedure: PERIPHERAL VASCULAR INTERVENTION;  Surgeon: [name], MD;  Location: [facility];  Service: Cardiovascular;  Laterality: Right;  SFA    Family History  Problem Relation Age of Onset   Pancreatic cancer Mother    Hypertension Father    Heart disease Father    Pancreatic cancer Sister    Pancreatic cancer Brother     Social History   Socioeconomic History   Marital status: Widowed    Spouse name: Not on file   Number of children: 2   Years of education: Not on  file   Highest education level: Not on file  Occupational History   Not on file  Tobacco Use   Smoking status: Every Day    Current packs/day: 0.50    Types: Cigarettes   Smokeless tobacco: Never   Tobacco comments:    1-2 cigs daily  Substance and Sexual Activity   Alcohol use: Never   Drug use: Never   Sexual activity: Not on file  Other Topics Concern   Not on file  Social History Narrative   Not on file   Social Drivers of Health   Financial Resource Strain: Not on file  Food Insecurity: No Food Insecurity (09/28/2023)    Hunger Vital Sign    Worried About Running Out of Food in the Last Year: Never true    Ran Out of Food in the Last Year: Never true  Transportation Needs: No Transportation Needs (09/27/2023)   PRAPARE - Administrator, Civil Service (Medical): No    Lack of Transportation (Non-Medical): No  Physical Activity: Not on file  Stress: Not on file  Social Connections: Not on file  Intimate Partner Violence: Not At Risk (09/27/2023)   Humiliation, Afraid, Rape, and Kick questionnaire    Fear of Current or Ex-Partner: No    Emotionally Abused: No    Physically Abused: No    Sexually Abused: No    Allergies  Allergen Reactions   Aspirin Itching, Swelling and Other (See Comments)    Tongue swelling   Levaquin [Levofloxacin In D5w] Other (See Comments)    Severe yeast reaction.   Codeine Rash    Current Facility-Administered Medications  Medication Dose Route Frequency Provider Last Rate Last Admin   acetaminophen (TYLENOL) tablet 650 mg  650 mg Oral Q6H PRN Alessandra Bevels, MD   650 mg at 09/28/23 1824   Or   acetaminophen (TYLENOL) suppository 650 mg  650 mg Rectal Q6H PRN Alessandra Bevels, MD       albuterol (PROVENTIL) (2.5 MG/3ML) 0.083% nebulizer solution 3 mL  3 mL Nebulization PRN Alessandra Bevels, MD       ALPRAZolam Prudy Feeler) tablet 0.25 mg  0.25 mg Oral Q6H PRN Alessandra Bevels, MD   0.25 mg at 09/28/23 2148   clopidogrel (PLAVIX) tablet 75 mg  75 mg Oral Daily Alessandra Bevels, MD   75 mg at 09/29/23 0919   gabapentin (NEURONTIN) capsule 300 mg  300 mg Oral QHS Alessandra Bevels, MD   300 mg at 09/28/23 2138   ibuprofen (ADVIL) tablet 200 mg  200 mg Oral Q8H PRN Alessandra Bevels, MD       influenza vaccine adjuvanted (FLUAD) injection 0.5 mL  0.5 mL Intramuscular Tomorrow-1000 Zigmund Daniel., MD       multivitamin with minerals tablet 1 tablet  1 tablet Oral q AM Alessandra Bevels, MD   1 tablet at 09/29/23 0920   nitroGLYCERIN (NITROSTAT) SL tablet  0.4 mg  0.4 mg Sublingual Q5 min PRN Alessandra Bevels, MD       ondansetron (ZOFRAN) tablet 4 mg  4 mg Oral Q6H PRN Alessandra Bevels, MD       Or   ondansetron (ZOFRAN) injection 4 mg  4 mg Intravenous Q6H PRN Alessandra Bevels, MD       PARoxetine (PAXIL) tablet 40 mg  40 mg Oral q morning Alessandra Bevels, MD   40 mg at 09/29/23 0919   rosuvastatin (CRESTOR) tablet 20 mg  20 mg Oral Daily Alessandra Bevels, MD  20 mg at 09/29/23 0919    PHYSICAL EXAM Vitals:   09/28/23 2006 09/29/23 0247 09/29/23 0829 09/29/23 0921  BP: (!) 134/54 (!) 159/65 (!) 186/59 (!) 160/49  Pulse: (!) 45 (!) 48 (!) 52   Resp: 16 16 16    Temp: 98.5 F (36.9 C) 98.5 F (36.9 C) 99 F (37.2 C)   TempSrc: Oral Oral Oral   SpO2: 93% 93% 98%   Weight:      Height:       Elderly woman in no distress Sinus bradycardia Unlabored breathing Brisk Doppler flow in the anterior tibial and posterior tibial arteries in the right No evidence of hematoma or other abnormality in the right groin by exam  PERTINENT LABORATORY AND RADIOLOGIC DATA  Most recent CBC    Latest Ref Rng & Units 09/29/2023    4:29 AM 09/27/2023    3:40 PM 09/27/2023   11:23 AM  CBC  WBC 4.0 - 10.5 K/uL 7.7  10.7  10.4   Hemoglobin 12.0 - 15.0 g/dL 9.3  16.1  09.6   Hematocrit 36.0 - 46.0 % 29.3  34.0  33.5   Platelets 150 - 400 K/uL 161  192  188      Most recent CMP    Latest Ref Rng & Units 09/29/2023    4:29 AM 09/27/2023    3:40 PM 09/27/2023   11:23 AM  CMP  Glucose 70 - 99 mg/dL 045   94   BUN 8 - 23 mg/dL 15   17   Creatinine 4.09 - 1.00 mg/dL 8.11  9.14  7.82   Sodium 135 - 145 mmol/L 141   137   Potassium 3.5 - 5.1 mmol/L 3.5   3.3   Chloride 98 - 111 mmol/L 108   106   CO2 22 - 32 mmol/L 25   24   Calcium 8.9 - 10.3 mg/dL 8.7   8.5   Total Protein 6.5 - 8.1 g/dL 5.4     Total Bilirubin <1.2 mg/dL 0.7     Alkaline Phos 38 - 126 U/L 63     AST 15 - 41 U/L 11     ALT 0 - 44 U/L 9       Renal  function Estimated Creatinine Clearance: 33.7 mL/min (A) (by C-G formula based on SCr of 1.38 mg/dL (H)).  Rande Brunt. Lenell Antu, MD FACS [facility] Phone Number: [phone] 09/29/2023 10:06 AM   Total time spent on preparing this encounter including chart review, data review, collecting history, examining the patient, coordinating care for this established patient, 30 minutes.  Portions of this report may have been transcribed using voice recognition software.  Every effort has been made to ensure accuracy; however, inadvertent computerized transcription errors may still be present.

## 2023-09-29 NOTE — Plan of Care (Signed)
  Problem: Education: Goal: Knowledge of General Education information will improve Description: Including pain rating scale, medication(s)/side effects and non-pharmacologic comfort measures Outcome: Progressing   Problem: Health Behavior/Discharge Planning: Goal: Ability to manage health-related needs will improve Outcome: Progressing   Problem: Clinical Measurements: Goal: Ability to maintain clinical measurements within normal limits will improve Outcome: Progressing Goal: Will remain free from infection Outcome: Progressing Goal: Diagnostic test results will improve Outcome: Progressing Goal: Respiratory complications will improve Outcome: Progressing Goal: Cardiovascular complication will be avoided Outcome: Progressing   Problem: Activity: Goal: Risk for activity intolerance will decrease Outcome: Progressing   Problem: Nutrition: Goal: Adequate nutrition will be maintained Outcome: Progressing   Problem: Coping: Goal: Level of anxiety will decrease Outcome: Progressing   Problem: Elimination: Goal: Will not experience complications related to bowel motility Outcome: Progressing Goal: Will not experience complications related to urinary retention Outcome: Progressing   Problem: Pain Management: Goal: General experience of comfort will improve Outcome: Progressing   Problem: Safety: Goal: Ability to remain free from injury will improve Outcome: Progressing   Problem: Skin Integrity: Goal: Risk for impaired skin integrity will decrease Outcome: Progressing   Problem: Education: Goal: Knowledge of disease or condition will improve Outcome: Progressing Goal: Understanding of medication regimen will improve Outcome: Progressing Goal: Individualized Educational Video(s) Outcome: Progressing   Problem: Activity: Goal: Ability to tolerate increased activity will improve Outcome: Progressing   Problem: Cardiac: Goal: Ability to achieve and maintain  adequate cardiopulmonary perfusion will improve Outcome: Progressing   Problem: Health Behavior/Discharge Planning: Goal: Ability to safely manage health-related needs after discharge will improve Outcome: Progressing Goal: Ability to safely manage health related needs after discharge will improve Outcome: Progressing Goal: Individualized Educational Video(s) Outcome: Progressing   Problem: Cardiac: Goal: Ability to achieve and maintain adequate cardiopulmonary perfusion will improve Outcome: Progressing   Problem: Education: Goal: Knowledge of cardiac device and self-care will improve Outcome: Progressing Goal: Ability to safely manage health related needs after discharge will improve Outcome: Progressing Goal: Individualized Educational Video(s) Outcome: Progressing

## 2023-09-29 NOTE — Progress Notes (Addendum)
  Patient Name: Tina George Date of Encounter: 09/29/2023  Primary Cardiologist: None Electrophysiologist: None  Interval Summary   Pt reports feeling very fatigued for 6-8 months, feels like she is SOB with very minimal exertion.  States she is tired of feeling poorly.     At this time, the patient denies chest pain, shortness of breath, or any new concerns.  Vital Signs    Vitals:   09/28/23 1805 09/28/23 1821 09/28/23 2006 09/29/23 0247  BP: (!) 152/70 (!) 158/67 (!) 134/54 (!) 159/65  Pulse:   (!) 45 (!) 48  Resp:   16 16  Temp:   98.5 F (36.9 C) 98.5 F (36.9 C)  TempSrc:   Oral Oral  SpO2:   93% 93%  Weight:      Height:        Intake/Output Summary (Last 24 hours) at 09/29/2023 0717 Last data filed at 09/29/2023 0251 Gross per 24 hour  Intake 600 ml  Output 1 ml  Net 599 ml   Filed Weights   09/27/23 1117  Weight: 64.4 kg    Physical Exam    GEN- The patient is well appearing, alert and oriented x 3 today.   Lungs- Clear to ausculation bilaterally, normal work of breathing Cardiac- Regular rate and rhythm, no murmurs, rubs or gallops GI- soft, NT, ND, + BS Extremities- no clubbing or cyanosis. No edema  Telemetry    SB 45-55 bpm, no AVB,  minimal rate increase with exertion (personally reviewed)  Studies: ECHO 08/2023 > LVEF 60%, RV normal, no valvular disease  Hospital Course    Tina George is a 77 y.o. female with hx of HTN, HLD, COPD, PAD s/p recent RLE intervention admitted 09/27/23 for 1 week of increasing fatigue.  Seen by PCP 12/12 and HR was in the 30's, instructed to hold Metoprolol and referred to ER.   Assessment & Plan    Symptomatic Bradycardia  Sinus Node Dysfunction  -last dose metoprolol 12/12  -tele review with no AVB, rates 45-60 -no evidence of conduction system disease  -ambulated 12/15 with HR at start 47, during 46-55 bpm  -continues to feel symptomatic  -NPO for planned PPM 12/16 pm  -hold DVT dosing lovenox,  last dose 12/15      For questions or updates, please contact [facility] Please consult [email or web address] for contact info under Cardiology/STEMI.  Signed, Canary Brim, MSN, APRN, NP-C, AGACNP-BC [facility] - Electrophysiology  09/29/2023, 7:17 AM   EP Attending  Patient seen and examined. Agree with above. Discussed with Dr. Lalla Brothers and Lenell Antu. The patient has severe peripheral vascular disease and probable occluded femoral stents. She also has symptomatic sinus node dysfunction with HR's in the 30's to 50's. I have discussed the indications for PPM insertion and the risks/benefits/goals/expectations of PPM insertion were reviewed with the patient and her family and they wish to proceed.  Tina George Tina [name],MD

## 2023-09-29 NOTE — Discharge Instructions (Addendum)
After Your Pacemaker   You have a Environmental education officer  ACTIVITY Do not lift your arm above shoulder height for 1 week after your procedure. After 7 days, you may progress as below.  You should remove your sling 24 hours after your procedure, unless otherwise instructed by your provider.     Monday October 06, 2023  Tuesday October 07, 2023 Wednesday October 08, 2023 Thursday October 09, 2023   Do not lift, push, pull, or carry anything over 10 pounds with the affected arm until 6 weeks (Monday November 10, 2023 ) after your procedure.   You may drive AFTER your wound check, unless you have been told otherwise by your provider.   Ask your healthcare provider when you can go back to work   INCISION/Dressing You can resume your plavix on 10/07/23  If large square, outer bandage is left in place, this can be removed after 24 hours from your procedure. Do not remove steri-strips or glue as below.   If a PRESSURE DRESSING (a bulky dressing that usually goes up over your shoulder) was applied or left in place, please follow instructions given by your provider on when to return to have this removed.   Monitor your Pacemaker site for redness, swelling, and drainage. Call the device clinic at [phone] if you experience these symptoms or fever/chills.  If your incision is sealed with Steri-strips or staples, you may shower 7 days after your procedure or when told by your provider. Do not remove the steri-strips or let the shower hit directly on your site. You may wash around your site with soap and water.    If you were discharged in a sling, please do not wear this during the day more than 48 hours after your surgery unless otherwise instructed. This may increase the risk of stiffness and soreness in your shoulder.   Avoid lotions, ointments, or perfumes over your incision until it is well-healed.  You may use a hot tub or a pool AFTER your wound check appointment if the  incision is completely closed.  Pacemaker Alerts:  Some alerts are vibratory and others beep. These are NOT emergencies. Please call our office to let us know. If this occurs at night or on weekends, it can wait until the next business day. Send a remote transmission.  If your device is capable of reading fluid status (for heart failure), you will be offered monthly monitoring to review this with you.   DEVICE MANAGEMENT Remote monitoring is used to monitor your pacemaker from home. This monitoring is scheduled every 91 days by our office. It allows [place] to keep an eye on the functioning of your device to ensure it is working properly. You will routinely see your Electrophysiologist annually (more often if necessary).   You should receive your ID card for your new device in 4-8 weeks. Keep this card with you at all times once received. Consider wearing a medical alert bracelet or necklace.  Your Pacemaker may be MRI compatible. This will be discussed at your next office visit/wound check.  You should avoid contact with strong electric or magnetic fields.   Do not use amateur (ham) radio equipment or electric (arc) welding torches. MP3 player headphones with magnets should not be used. Some devices are safe to use if held at least 12 inches (30 cm) from your Pacemaker. These include power tools, lawn mowers, and speakers. If you are unsure if something is safe to use, ask your health care provider.  When using your cell phone, hold it to the ear that is on the opposite side from the Pacemaker. Do not leave your cell phone in a pocket over the Pacemaker.  You may safely use electric blankets, heating pads, computers, and microwave ovens.  Call the office right away if: You have chest pain. You feel more short of breath than you have felt before. You feel more light-headed than you have felt before. Your incision starts to open up.  This information is not intended to replace advice given to you  by your health care provider. Make sure you discuss any questions you have with your health care provider.

## 2023-09-29 NOTE — Progress Notes (Signed)
PROGRESS NOTE    Tina George  BMW:[id number] DOB: 12-31-1945 DOA: 09/27/2023 PCP: Simone Curia, MD    Brief Narrative:  77 year old with history of hypertension, hyperlipidemia, GERD, COPD, chronic leg edema, peripheral artery disease with recent stenting presented to the cardiology office with fatigue, heart rate of 32.  Recently stopped metoprolol.  Followed by EP.  Subjective: Patient seen and examined.  Denies any complaints except she has persistent weakness and fatigue after walking and exercise intolerance.  Heart rate usually remains more than 40 on the telemetry monitor. Cardiology decided to place pacemaker for symptomatic bradycardia. Assessment & Plan:   Symptomatic bradycardia: Presented with heart rate 32 and hypotension and recent clinic visit.  Continue to complain of fatigue and weakness.  Orthostatic negative.  TSH normal. For pacemaker today.  Essential hypertension: Blood pressure is stable without medications today.  Will monitor.  Peripheral artery disease status post recent PTCA: Recent right femoropopliteal angioplasty and stenting.  Followed by vascular.  Patient statin and Plavix.  Vascular order for ABI and right lower extremity duplexes.  Tobacco use: Encourage cessation.  Anxiety depression: Chronic.  On Paxil and Xanax.     DVT prophylaxis: SCDs   Code Status: Full code Family Communication: None at the bedside Disposition Plan: Status is: Inpatient Remains inpatient appropriate because: Inpatient procedures planned     Consultants:  Cardiology  Procedures:  Pacemaker planned  Antimicrobials:  None     Objective: Vitals:   09/28/23 2006 09/29/23 0247 09/29/23 0829 09/29/23 0921  BP: (!) 134/54 (!) 159/65 (!) 186/59 (!) 160/49  Pulse: (!) 45 (!) 48 (!) 52   Resp: 16 16 16    Temp: 98.5 F (36.9 C) 98.5 F (36.9 C) 99 F (37.2 C)   TempSrc: Oral Oral Oral   SpO2: 93% 93% 98%   Weight:      Height:        Intake/Output  Summary (Last 24 hours) at 09/29/2023 1125 Last data filed at 09/29/2023 0944 Gross per 24 hour  Intake 840 ml  Output 1 ml  Net 839 ml   Filed Weights   09/27/23 1117  Weight: 64.4 kg    Examination:  General exam: Appears calm and comfortable at rest. Respiratory system: Clear to auscultation. Respiratory effort normal. Cardiovascular system: S1 & S2 heard, RRR.  1+ bilateral pedal edema. GI: Soft.  Nontender.  Bowel sound present.       Data Reviewed: I have personally reviewed following labs and imaging studies  CBC: Recent Labs  Lab 09/27/23 1123 09/27/23 1540 09/29/23 0429  WBC 10.4 10.7* 7.7  NEUTROABS  --   --  4.6  HGB 10.5* 10.9* 9.3*  HCT 33.5* 34.0* 29.3*  MCV 94.1 92.4 92.7  PLT 188 192 161   Basic Metabolic Panel: Recent Labs  Lab 09/27/23 1123 09/27/23 1540 09/29/23 0429  NA 137  --  141  K 3.3*  --  3.5  CL 106  --  108  CO2 24  --  25  GLUCOSE 94  --  100*  BUN 17  --  15  CREATININE 1.17* 1.30* 1.38*  CALCIUM 8.5*  --  8.7*  MG  --   --  2.0  PHOS  --   --  4.0   GFR: Estimated Creatinine Clearance: 33.7 mL/min (A) (by C-G formula based on SCr of 1.38 mg/dL (H)). Liver Function Tests: Recent Labs  Lab 09/29/23 0429  AST 11*  ALT 9  ALKPHOS 63  BILITOT  0.7  PROT 5.4*  ALBUMIN 2.5*   No results for input(s): "LIPASE", "AMYLASE" in the last 168 hours. No results for input(s): "AMMONIA" in the last 168 hours. Coagulation Profile: No results for input(s): "INR", "PROTIME" in the last 168 hours. Cardiac Enzymes: No results for input(s): "CKTOTAL", "CKMB", "CKMBINDEX", "TROPONINI" in the last 168 hours. BNP (last 3 results) Recent Labs    04/22/23 1445 05/26/23 1307  PROBNP 1,074* 847*   HbA1C: No results for input(s): "HGBA1C" in the last 72 hours. CBG: No results for input(s): "GLUCAP" in the last 168 hours. Lipid Profile: No results for input(s): "CHOL", "HDL", "LDLCALC", "TRIG", "CHOLHDL", "LDLDIRECT" in the last 72  hours. Thyroid Function Tests: Recent Labs    09/27/23 1840  TSH 1.584   Anemia Panel: Recent Labs    09/27/23 1123  VITAMINB12 234  FOLATE 6.4  FERRITIN 209  TIBC 319  IRON 35  RETICCTPCT 1.4   Sepsis Labs: No results for input(s): "PROCALCITON", "LATICACIDVEN" in the last 168 hours.  Recent Results (from the past 240 hours)  Resp panel by RT-PCR (RSV, Flu A&B, Covid) Anterior Nasal Swab     Status: None   Collection Time: 09/28/23  7:10 AM   Specimen: Anterior Nasal Swab  Result Value Ref Range Status   SARS Coronavirus 2 by RT PCR NEGATIVE NEGATIVE Final   Influenza A by PCR NEGATIVE NEGATIVE Final   Influenza B by PCR NEGATIVE NEGATIVE Final    Comment: (NOTE) The Xpert Xpress SARS-CoV-2/FLU/RSV plus assay is intended as an aid in the diagnosis of influenza from Nasopharyngeal swab specimens and should not be used as a sole basis for treatment. Nasal washings and aspirates are unacceptable for Xpert Xpress SARS-CoV-2/FLU/RSV testing.  Fact Sheet for Patients: BloggerCourse.com  Fact Sheet for Healthcare Providers: SeriousBroker.it  This test is not yet approved or cleared by the [place] FDA and has been authorized for detection and/or diagnosis of SARS-CoV-2 by FDA under an Emergency Use Authorization (EUA). This EUA will remain in effect (meaning this test can be used) for the duration of the COVID-19 declaration under Section 564(b)(1) of the Act, 21 U.S.C. section 360bbb-3(b)(1), unless the authorization is terminated or revoked.     Resp Syncytial Virus by PCR NEGATIVE NEGATIVE Final    Comment: (NOTE) Fact Sheet for Patients: BloggerCourse.com  Fact Sheet for Healthcare Providers: SeriousBroker.it  This test is not yet approved or cleared by the [place] FDA and has been authorized for detection and/or diagnosis of SARS-CoV-2 by FDA under  an Emergency Use Authorization (EUA). This EUA will remain in effect (meaning this test can be used) for the duration of the COVID-19 declaration under Section 564(b)(1) of the Act, 21 U.S.C. section 360bbb-3(b)(1), unless the authorization is terminated or revoked.  Performed at [facility], [address], [place], [place] [address]          Radiology Studies: DG Chest Port 1 View Result Date: 09/27/2023 CLINICAL DATA:  Chest pain EXAM: PORTABLE CHEST 1 VIEW COMPARISON:  04/28/2022 chest radiograph. FINDINGS: Bilateral breast prostheses noted. Stable cardiomediastinal silhouette with top-normal heart size. No pneumothorax. Slight blunting of both costophrenic angles. No overt pulmonary edema. No consolidative airspace disease. IMPRESSION: Slight blunting of both costophrenic angles, cannot exclude trace bilateral pleural effusions. Otherwise no active cardiopulmonary disease. Electronically Signed   By: Delbert Phenix M.D.   On: 09/27/2023 12:09        Scheduled Meds:  clopidogrel  75 mg Oral Daily  gabapentin  300 mg Oral QHS   influenza vaccine adjuvanted  0.5 mL Intramuscular Tomorrow-1000   multivitamin with minerals  1 tablet Oral q AM   PARoxetine  40 mg Oral q morning   rosuvastatin  20 mg Oral Daily   sodium chloride flush  3 mL Intravenous Q12H   Continuous Infusions:   LOS: 1 day    Time spent: 35 minutes    Dorcas Carrow, MD [facility]

## 2023-09-30 ENCOUNTER — Encounter (HOSPITAL_COMMUNITY): Payer: Self-pay | Admitting: Internal Medicine

## 2023-09-30 ENCOUNTER — Inpatient Hospital Stay (HOSPITAL_COMMUNITY): Payer: Medicare Other

## 2023-09-30 DIAGNOSIS — R001 Bradycardia, unspecified: Secondary | ICD-10-CM | POA: Diagnosis not present

## 2023-09-30 MED ORDER — OXYCODONE HCL 5 MG PO TABS
5.0000 mg | ORAL_TABLET | Freq: Once | ORAL | Status: AC
  Administered 2023-09-30: 5 mg via ORAL
  Filled 2023-09-30: qty 1

## 2023-09-30 NOTE — Discharge Summary (Signed)
Physician Discharge Summary  Tina George WUJ:[id number] DOB: [birth year] DOA: 09/27/2023  PCP: Simone Curia, MD  Admit date: 09/27/2023 Discharge date: 09/30/2023  Admitted From: Home Disposition: Home  Recommendations for Outpatient Follow-up:  Follow up with PCP in 1-2 weeks Cardiology to schedule follow-up  Home Health: N/A Equipment/Devices: N/A  Discharge Condition: Stable CODE STATUS: Full code Diet recommendation: Low-salt diet  Discharge summary: 77 year old with history of hypertension, hyperlipidemia, GERD, COPD, chronic leg edema, peripheral artery disease with recent stenting presented to the cardiology office with fatigue, heart rate of 32. Recently stopped metoprolol. Followed by EP.  Patient had improvement of her heart rate with the stopping metoprolol, however patient remained very fatigued and tired with heart rate 40-50.  She had exercise intolerance.  This was thought to be symptomatic bradycardia.  Patient underwent permanent pacemaker placement on 12/16, postoperatively doing well.  Interrogated and functioning well.  Discharged home with cardiology instructions, follow-up instructions. Left upper extremity precautions as below instructed. Adequate pain medications. Cardiology recommended to hold Plavix until 1 week from procedure.  Discharge Diagnoses:  Principal Problem:   Symptomatic bradycardia Active Problems:   COPD (chronic obstructive pulmonary disease) (HCC)   Dyslipidemia   GERD (gastroesophageal reflux disease)   Muscle cramps   Bilateral leg edema   Pain in both lower extremities   Asthma   Bradycardia    Discharge Instructions  Discharge Instructions     Diet - low sodium heart healthy   Complete by: As directed    Discharge instructions   Complete by: As directed    Left arm precautions as instructed.  Do not take Plavix until 12/23 and start back similar doses   Increase activity slowly   Complete by: As directed        Allergies as of 09/30/2023       Reactions   Aspirin Itching, Swelling, Other (See Comments)   Tongue swelling   Levaquin [levofloxacin In D5w] Other (See Comments)   Severe yeast reaction.   Codeine Rash        Medication List     PAUSE taking these medications    clopidogrel 75 MG tablet Wait to take this until: October 06, 2023 Commonly known as: Plavix Take 1 tablet (75 mg total) by mouth daily.       STOP taking these medications    metoprolol succinate 100 MG 24 hr tablet Commonly known as: TOPROL-XL       TAKE these medications    albuterol 108 (90 Base) MCG/ACT inhaler Commonly known as: VENTOLIN HFA Inhale 1-2 puffs into the lungs as needed for wheezing or shortness of breath.   ALPRAZolam 0.25 MG tablet Commonly known as: XANAX Take 0.25 mg by mouth every 6 (six) hours as needed for anxiety.   Breztri Aerosphere 160-9-4.8 MCG/ACT Aero Generic drug: Budeson-Glycopyrrol-Formoterol Inhale 2 puffs into the lungs in the morning and at bedtime. What changed:  when to take this reasons to take this   furosemide 40 MG tablet Commonly known as: LASIX Take 40 mg by mouth 2 (two) times daily as needed for fluid.   gabapentin 300 MG capsule Commonly known as: NEURONTIN Take 300 mg by mouth at bedtime.   ibuprofen 200 MG tablet Commonly known as: ADVIL Take 200 mg by mouth every 8 (eight) hours as needed for pain.   ipratropium-albuterol 0.5-2.5 (3) MG/3ML Soln Commonly known as: DUONEB Take 3 mLs by nebulization every 4 (four) hours as needed for wheezing or shortness of breath.  multivitamin with minerals Tabs tablet Take 1 tablet by mouth in the morning. Multivitamin for Women   nitroGLYCERIN 0.4 MG SL tablet Commonly known as: NITROSTAT Place 1 tablet (0.4 mg total) under the tongue every 5 (five) minutes as needed for chest pain.   PARoxetine 40 MG tablet Commonly known as: PAXIL Take 40 mg by mouth every morning.   rosuvastatin 20  MG tablet Commonly known as: CRESTOR Take 1 tablet (20 mg total) by mouth daily.   traMADol 50 MG tablet Commonly known as: ULTRAM Take 50 mg by mouth 3 (three) times daily as needed for moderate pain (pain score 4-6) or severe pain (pain score 7-10).        Follow-up Information     [name], MD Follow up in 1 month(s).   Specialties: Vascular Surgery, Interventional Cardiology Why: The office will call you with your appointment Contact information: [address] [place] [address] [phone]                Allergies  Allergen Reactions   Aspirin Itching, Swelling and Other (See Comments)    Tongue swelling   Levaquin [Levofloxacin In D5w] Other (See Comments)    Severe yeast reaction.   Codeine Rash    Consultations: Cardiology Vascular surgery   Procedures/Studies: VAS [place] LOWER EXTREMITY ARTERIAL DUPLEX Result Date: 09/29/2023 LOWER EXTREMITY ARTERIAL DUPLEX STUDY Patient Name:  Tina George  Date of Exam:   09/29/2023 Medical Rec #: [id number]       Accession #:    [id number] Date of Birth: September 06, 1946       Patient Gender: F Patient Age:   56 years Exam Location:  [facility] Procedure:      VAS [place] LOWER EXTREMITY ARTERIAL DUPLEX Referring Phys: Heath Lark --------------------------------------------------------------------------------  Indications: Claudication, and left transmetatarsal amputation and Status post              right femoral artery stenting on 09/05/23. Patient complains of              right leg pain when walking and standing following the procedure. High Risk Factors: Hypertension, hyperlipidemia, current smoker, coronary artery                    disease.  Vascular Interventions: 09/05/23 - Right SFA stenting. History of known left leg                         PVD. Current ABI:            Right -0.38                         Left - 0.58 Performing Technologist: Rosemount Sink Sturdivant RDMS, RVT  Examination Guidelines: A complete  evaluation includes B-mode imaging, spectral Doppler, color Doppler, and power Doppler as needed of all accessible portions of each vessel. Bilateral testing is considered an integral part of a complete examination. Limited examinations for reoccurring indications may be performed as noted.  +-----------+--------+-----+---------------+----------+------------------------+ RIGHT      PSV cm/sRatioStenosis       Waveform  Comments                 +-----------+--------+-----+---------------+----------+------------------------+ CFA Prox   94                          monophasic                         +-----------+--------+-----+---------------+----------+------------------------+  CFA Distal 98                          monophasic                         +-----------+--------+-----+---------------+----------+------------------------+ DFA        211     3.4  50-74% stenosis          62 cm/s dist to stenosis +-----------+--------+-----+---------------+----------+------------------------+ SFA Prox                                         Stent                    +-----------+--------+-----+---------------+----------+------------------------+ SFA Mid                                          Stent                    +-----------+--------+-----+---------------+----------+------------------------+ SFA Distal                                       Stent                    +-----------+--------+-----+---------------+----------+------------------------+ POP Prox   31                          monophasicreconst                  +-----------+--------+-----+---------------+----------+------------------------+ POP Distal 20                          monophasic                         +-----------+--------+-----+---------------+----------+------------------------+ ATA Prox   20                          monophasic                          +-----------+--------+-----+---------------+----------+------------------------+ ATA Distal 17                          monophasic                         +-----------+--------+-----+---------------+----------+------------------------+ PTA Prox   18                          monophasic                         +-----------+--------+-----+---------------+----------+------------------------+ PTA Distal 26                          monophasic                         +-----------+--------+-----+---------------+----------+------------------------+ PERO Distal8  monophasic                         +-----------+--------+-----+---------------+----------+------------------------+ DP         20                          monophasic                         +-----------+--------+-----+---------------+----------+------------------------+  Right Stent(s): +---------------+--+--------+----------++ Prox to Stent  89        monophasic +---------------+--+--------+----------++ Proximal Stent 0 occluded           +---------------+--+--------+----------++ Mid Stent      0 occluded           +---------------+--+--------+----------++ Distal Stent   0 occluded           +---------------+--+--------+----------++ Distal to Stent0 occluded           +---------------+--+--------+----------++    Summary: Right: 50-74% stenosis noted in the deep femoral artery. Total occlusion noted in the superficial femoral artery stent.  See table(s) above for measurements and observations. Electronically signed by Gerarda Fraction on 09/29/2023 at 6:48:49 PM.    Final    VAS [place] ABI WITH/WO TBI Result Date: 09/29/2023  LOWER EXTREMITY DOPPLER STUDY Patient Name:  Tina George  Date of Exam:   09/29/2023 Medical Rec #: [id number]       Accession #:    [id number] Date of Birth: 1946-01-12       Patient Gender: F Patient Age:   49 years Exam Location[facility]  [facility]  Procedure:      VAS [place] ABI WITH/WO TBI Referring Phys: Heath Lark --------------------------------------------------------------------------------  Indications: Claudication, and peripheral artery disease. Status post right              femoral artery stenting on 09/05/23. Patient stated that her leg is              very painful when walking and standing. High Risk Factors: Hypertension, hyperlipidemia, current smoker, coronary artery                    disease.  Vascular Interventions: 09/05/23 - Right SFA stenting. History of known left leg                         PVD. Performing Technologist: Marilynne Halsted RDMS, RVT  Examination Guidelines: A complete evaluation includes at minimum, Doppler waveform signals and systolic blood pressure reading at the level of bilateral brachial, anterior tibial, and posterior tibial arteries, when vessel segments are accessible. Bilateral testing is considered an integral part of a complete examination. Photoelectric Plethysmograph (PPG) waveforms and toe systolic pressure readings are included as required and additional duplex testing as needed. Limited examinations for reoccurring indications may be performed as noted.  ABI Findings: +---------+------------------+-----+----------+--------+ Right    Rt Pressure (mmHg)IndexWaveform  Comment  +---------+------------------+-----+----------+--------+ Brachial 166                    triphasic          +---------+------------------+-----+----------+--------+ PTA      65                0.38 monophasic         +---------+------------------+-----+----------+--------+ DP       64  0.38 monophasic         +---------+------------------+-----+----------+--------+ Great Toe40                0.24 Abnormal           +---------+------------------+-----+----------+--------+ +---------+------------------+-----+----------+-------+ Left     Lt Pressure (mmHg)IndexWaveform  Comment  +---------+------------------+-----+----------+-------+ Brachial 170                    triphasic         +---------+------------------+-----+----------+-------+ PTA      93                0.55 monophasic        +---------+------------------+-----+----------+-------+ DP       99                0.58 biphasic          +---------+------------------+-----+----------+-------+ Great Toe51                0.30 Abnormal          +---------+------------------+-----+----------+-------+ +-------+-----------+-----------+------------+------------+ ABI/TBIToday's ABIToday's TBIPrevious ABIPrevious TBI +-------+-----------+-----------+------------+------------+ Right  0.38       0.24       0.77        0.40         +-------+-----------+-----------+------------+------------+ Left   0.58       0.30       0.63        0.45         +-------+-----------+-----------+------------+------------+ Compared to prior study on 07/22/23.  Summary: Right: Resting right ankle-brachial index indicates severe right lower extremity arterial disease. The right toe-brachial index is abnormal. Left: Resting left ankle-brachial index indicates moderate left lower extremity arterial disease. The left toe-brachial index is abnormal. *See table(s) above for measurements and observations.  Electronically signed by Gerarda Fraction on 09/29/2023 at 6:46:51 PM.    Final    EP PPM/ICD IMPLANT Result Date: 09/29/2023 CONCLUSIONS:  1. Successful implantation of a [place] Sci dual-chamber pacemaker for symptomatic bradycardia due to sinus node dysfunction  2. No early apparent complications.       Lewayne Bunting, MD 09/29/2023   DG Chest Port 1 View Result Date: 09/27/2023 CLINICAL DATA:  Chest pain EXAM: PORTABLE CHEST 1 VIEW COMPARISON:  04/28/2022 chest radiograph. FINDINGS: Bilateral breast prostheses noted. Stable cardiomediastinal silhouette with top-normal heart size. No pneumothorax. Slight blunting of both  costophrenic angles. No overt pulmonary edema. No consolidative airspace disease. IMPRESSION: Slight blunting of both costophrenic angles, cannot exclude trace bilateral pleural effusions. Otherwise no active cardiopulmonary disease. Electronically Signed   By: Delbert Phenix M.D.   On: 09/27/2023 12:09   PERIPHERAL VASCULAR CATHETERIZATION Result Date: 09/05/2023 Table formatting from the original result was not included. DATE OF SERVICE: 09/05/2023  PATIENT:  Tina George  77 y.o. 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 Lenell Antu, MD  ASSISTANT: none  ANESTHESIA:   local and IV sedation  ESTIMATED BLOOD LOSS: minimal  LOCAL MEDICATIONS USED:  LIDOCAINE  COUNTS: confirmed correct.  PATIENT DISPOSITION:  PACU - hemodynamically stable.  Delay start of Pharmacological VTE agent (>24hrs) due to surgical blood loss or risk of bleeding: no  INDICATION FOR PROCEDURE: Tina George is a 77 y.o. female with bilateral leg claudication which has not responded to a trial of medical therapy. After careful discussion of risks, benefits, and alternatives the patient was offered angiography with intervention. The patient understood and wished to proceed.  OPERATIVE FINDINGS:  Left renal artery: patent Right renal artery: patent Infrarenal aorta: patent Left common iliac artery: patent Right common iliac artery: patent Left internal iliac artery: patent Right internal iliac artery: patent Left external iliac artery: patent Right external iliac artery: patent Left common femoral artery: not studied Right common femoral artery: patent Left  profunda femoris artery: not studied Right profunda femoris artery: patent Left superficial femoral artery: not studied Right superficial femoral artery: diffuse disease; heavily calcified. 99% focal stenosis in mid section. Proximal to this, several areas of severe stenosis, greatest 75%.  Left popliteal artery: not studied Right popliteal artery: patent Left anterior tibial artery: not studied Right anterior tibial artery: patent proximally; tapers distally. Disadvantaged about the ankle Left tibioperoneal trunk: not studied Right tibioperoneal trunk: patent Left peroneal artery: not studied Right peroneal artery: patent proximally; tapers distally. Disadvantaged about the ankle Left posterior tibial artery: not studied Right posterior tibial artery: patent proximally; tapers distally. Disadvantaged about the ankle Left pedal circulation: not studied Right pedal circulation: severely disadvantaged  GLASS score. FP 3. IP 0. P 1. Stage II.  WIfI score. Not applicable.  DESCRIPTION OF PROCEDURE: After identification of the patient in the pre-operative holding area, the patient was transferred to the operating room. The patient was positioned supine on the operating room table. Anesthesia was induced. The groins was prepped and draped in standard fashion. A surgical pause was performed confirming correct patient, procedure, and operative location.  The left groin was anesthetized with subcutaneous injection of 1% lidocaine. Using ultrasound guidance, the left common femoral artery was accessed with micropuncture technique. Fluoroscopy was used to confirm cannulation over the femoral head. The 74F micropuncture sheath was upsized to 61F.  A Benson wire was advanced into the distal aorta. Over the wire an omni flush catheter was advanced to the level of L2. Aortogram was performed - see above for details.  The right common iliac artery was selected with an omniflush catheter and glidewire guidewire. The wire was advanced  into the common femoral artery. Over the wire the omni flush catheter was advanced into the external iliac artery. Selective angiography was performed - see above for details.  The decision was made to intervene. The patient was heparinized with 7,000 units of heparin. The 61F sheath was exchanged for a 43F x 45cm sheath. Selective angiography of the left lower extremity was performed prior to intervention.  The lesions were treated with: Right femoropopliteal angioplasty and stenting (6x141mm, 6x173mm, 6x77mm Eluvia)  Completion angiography revealed: Resolution of R SFA disease  A perclose device was used to close the arteriotomy. Hemostasis was excellent upon completion.  Conscious sedation was administered with the use of IV fentanyl and midazolam under continuous physician and nurse monitoring.  Heart rate, blood pressure, and oxygen saturation were continuously monitored.  Total sedation time was 45 minutes  Upon completion of the case instrument and sharps counts were confirmed correct. The patient was transferred to the PACU in good condition. I was present for all portions of the procedure.  PLAN: Patient allergic to ASA. Load with plavix and start 75mg  PO every day tomorrow. Statin therapy. Plan LLE angiogram next 2-3 weeks to treat contralateral disease.  Rande Brunt. Lenell Antu,  MD FACS [facility] Phone Number: [phone] 09/05/2023 10:54 AM   (Echo, Carotid, EGD, Colonoscopy, ERCP)    Subjective: Patient seen and examined.  At the time of my interview, she was complaining of moderate to severe pain on her surgical site and relieved with oxycodone.  She does take tramadol at home.  No other overnight events.   Discharge Exam: Vitals:   09/30/23 0416 09/30/23 0829  BP:  (!) 169/61  Pulse:  60  Resp: 18 16  Temp: 98.2 F (36.8 C) 97.9 F (36.6 C)  SpO2:  96%   Vitals:   09/29/23 1756 09/29/23 1954 09/30/23 0416 09/30/23 0829  BP: (!) 140/55 (!) 158/65   (!) 169/61  Pulse: 66   60  Resp: 20 20 18 16   Temp:  97.7 F (36.5 C) 98.2 F (36.8 C) 97.9 F (36.6 C)  TempSrc:  Oral Oral Oral  SpO2: 100%   96%  Weight:      Height:        General: Pt is alert, awake, not in acute distress Cardiovascular: RRR, S1/S2 +, no rubs, no gallops Left precordial pacemaker, dressing intact.  Looks clean and dry. Respiratory: CTA bilaterally, no wheezing, no rhonchi Abdominal: Soft, NT, ND, bowel sounds + Extremities: no edema, no cyanosis    The results of significant diagnostics from this hospitalization (including imaging, microbiology, ancillary and laboratory) are listed below for reference.     Microbiology: Recent Results (from the past 240 hours)  Resp panel by RT-PCR (RSV, Flu A&B, Covid) Anterior Nasal Swab     Status: None   Collection Time: 09/28/23  7:10 AM   Specimen: Anterior Nasal Swab  Result Value Ref Range Status   SARS Coronavirus 2 by RT PCR NEGATIVE NEGATIVE Final   Influenza A by PCR NEGATIVE NEGATIVE Final   Influenza B by PCR NEGATIVE NEGATIVE Final    Comment: (NOTE) The Xpert Xpress SARS-CoV-2/FLU/RSV plus assay is intended as an aid in the diagnosis of influenza from Nasopharyngeal swab specimens and should not be used as a sole basis for treatment. Nasal washings and aspirates are unacceptable for Xpert Xpress SARS-CoV-2/FLU/RSV testing.  Fact Sheet for Patients: BloggerCourse.com  Fact Sheet for Healthcare Providers: SeriousBroker.it  This test is not yet approved or cleared by the [place] FDA and has been authorized for detection and/or diagnosis of SARS-CoV-2 by FDA under an Emergency Use Authorization (EUA). This EUA will remain in effect (meaning this test can be used) for the duration of the COVID-19 declaration under Section 564(b)(1) of the Act, 21 U.S.C. section 360bbb-3(b)(1), unless the authorization is terminated or revoked.     Resp  Syncytial Virus by PCR NEGATIVE NEGATIVE Final    Comment: (NOTE) Fact Sheet for Patients: BloggerCourse.com  Fact Sheet for Healthcare Providers: SeriousBroker.it  This test is not yet approved or cleared by the [place] FDA and has been authorized for detection and/or diagnosis of SARS-CoV-2 by FDA under an Emergency Use Authorization (EUA). This EUA will remain in effect (meaning this test can be used) for the duration of the COVID-19 declaration under Section 564(b)(1) of the Act, 21 U.S.C. section 360bbb-3(b)(1), unless the authorization is terminated or revoked.  Performed at [facility], [address] [place], [place] [address]   Urine Culture     Status: Abnormal (Preliminary result)   Collection Time: 09/28/23  9:53 PM   Specimen: Urine, Random  Result Value Ref Range Status  Specimen Description URINE, RANDOM  Final   Special Requests   Final    NONE Reflexed from [id number] Performed at [facility], [address] [place], [place] [address]    Culture >=100,000 COLONIES/mL GRAM NEGATIVE RODS (A)  Final   Report Status PENDING  Incomplete  Surgical PCR screen     Status: None   Collection Time: 09/29/23  1:21 PM   Specimen: Nasal Mucosa; Nasal Swab  Result Value Ref Range Status   MRSA, PCR NEGATIVE NEGATIVE Final   Staphylococcus aureus NEGATIVE NEGATIVE Final    Comment: (NOTE) The Xpert SA Assay (FDA approved for NASAL specimens in patients 32 years of age and older), is one component of a comprehensive surveillance program. It is not intended to diagnose infection nor to guide or monitor treatment. Performed at [facility], [address], [place], [place] [address]      Labs: BNP (last 3 results) No results for input(s): "BNP" in the last 8760 hours. Basic Metabolic Panel: Recent Labs  Lab 09/27/23 1123 09/27/23 1540 09/29/23 0429  NA 137  --  141  K 3.3*  --  3.5  CL 106   --  108  CO2 24  --  25  GLUCOSE 94  --  100*  BUN 17  --  15  CREATININE 1.17* 1.30* 1.38*  CALCIUM 8.5*  --  8.7*  MG  --   --  2.0  PHOS  --   --  4.0   Liver Function Tests: Recent Labs  Lab 09/29/23 0429  AST 11*  ALT 9  ALKPHOS 63  BILITOT 0.7  PROT 5.4*  ALBUMIN 2.5*   No results for input(s): "LIPASE", "AMYLASE" in the last 168 hours. No results for input(s): "AMMONIA" in the last 168 hours. CBC: Recent Labs  Lab 09/27/23 1123 09/27/23 1540 09/29/23 0429  WBC 10.4 10.7* 7.7  NEUTROABS  --   --  4.6  HGB 10.5* 10.9* 9.3*  HCT 33.5* 34.0* 29.3*  MCV 94.1 92.4 92.7  PLT 188 192 161   Cardiac Enzymes: No results for input(s): "CKTOTAL", "CKMB", "CKMBINDEX", "TROPONINI" in the last 168 hours. BNP: Invalid input(s): "POCBNP" CBG: No results for input(s): "GLUCAP" in the last 168 hours. D-Dimer No results for input(s): "DDIMER" in the last 72 hours. Hgb A1c No results for input(s): "HGBA1C" in the last 72 hours. Lipid Profile No results for input(s): "CHOL", "HDL", "LDLCALC", "TRIG", "CHOLHDL", "LDLDIRECT" in the last 72 hours. Thyroid function studies Recent Labs    09/27/23 1840  TSH 1.584   Anemia work up No results for input(s): "VITAMINB12", "FOLATE", "FERRITIN", "TIBC", "IRON", "RETICCTPCT" in the last 72 hours. Urinalysis    Component Value Date/Time   COLORURINE YELLOW 09/28/2023 2153   APPEARANCEUR HAZY (A) 09/28/2023 2153   LABSPEC 1.011 09/28/2023 2153   PHURINE 5.0 09/28/2023 2153   GLUCOSEU NEGATIVE 09/28/2023 2153   HGBUR SMALL (A) 09/28/2023 2153   BILIRUBINUR NEGATIVE 09/28/2023 2153   KETONESUR NEGATIVE 09/28/2023 2153   PROTEINUR NEGATIVE 09/28/2023 2153   NITRITE NEGATIVE 09/28/2023 2153   LEUKOCYTESUR SMALL (A) 09/28/2023 2153   Sepsis Labs Recent Labs  Lab 09/27/23 1123 09/27/23 1540 09/29/23 0429  WBC 10.4 10.7* 7.7   Microbiology Recent Results (from the past 240 hours)  Resp panel by RT-PCR (RSV, Flu A&B, Covid)  Anterior Nasal Swab     Status: None   Collection Time: 09/28/23  7:10 AM   Specimen: Anterior Nasal Swab  Result Value Ref Range Status   SARS Coronavirus 2 by RT PCR NEGATIVE NEGATIVE Final   Influenza A by PCR NEGATIVE NEGATIVE Final   Influenza B by PCR NEGATIVE NEGATIVE Final    Comment: (NOTE) The Xpert Xpress SARS-CoV-2/FLU/RSV plus assay is intended as an aid in the diagnosis of influenza from Nasopharyngeal swab specimens and should not be used as a sole basis for treatment. Nasal washings and aspirates are unacceptable for Xpert Xpress SARS-CoV-2/FLU/RSV testing.  Fact Sheet for Patients: BloggerCourse.com  Fact Sheet for Healthcare Providers: SeriousBroker.it  This test is not yet approved or cleared by the [place] FDA and has been authorized for detection and/or diagnosis of SARS-CoV-2 by FDA under an Emergency Use Authorization (EUA). This EUA will remain in effect (meaning this test can be used) for the duration of the COVID-19 declaration under Section 564(b)(1) of the Act, 21 U.S.C. section 360bbb-3(b)(1), unless the authorization is terminated or revoked.     Resp Syncytial Virus by PCR NEGATIVE NEGATIVE Final    Comment: (NOTE) Fact Sheet for Patients: BloggerCourse.com  Fact Sheet for Healthcare Providers: SeriousBroker.it  This test is not yet approved or cleared by the [place] FDA and has been authorized for detection and/or diagnosis of SARS-CoV-2 by FDA under an Emergency Use Authorization (EUA). This EUA will remain in effect (meaning this test can be used) for the duration of the COVID-19 declaration under Section 564(b)(1) of the Act, 21 U.S.C. section 360bbb-3(b)(1), unless the authorization is terminated or revoked.  Performed at [facility], [address] [place], [place] [address]   Urine Culture     Status: Abnormal  (Preliminary result)   Collection Time: 09/28/23  9:53 PM   Specimen: Urine, Random  Result Value Ref Range Status   Specimen Description URINE, RANDOM  Final   Special Requests   Final    NONE Reflexed from [id number] Performed at [facility], [address], [place], [place] [address]    Culture >=100,000 COLONIES/mL GRAM NEGATIVE RODS (A)  Final   Report Status PENDING  Incomplete  Surgical PCR screen     Status: None   Collection Time: 09/29/23  1:21 PM   Specimen: Nasal Mucosa; Nasal Swab  Result Value Ref Range Status   MRSA, PCR NEGATIVE NEGATIVE Final   Staphylococcus aureus NEGATIVE NEGATIVE Final    Comment: (NOTE) The Xpert SA Assay (FDA approved for NASAL specimens in patients 8 years of age and older), is one component of a comprehensive surveillance program. It is not intended to diagnose infection nor to guide or monitor treatment. Performed at [facility], [address], [place], [place] [address]      Time coordinating discharge: 35 minutes  SIGNED:   Dorcas Carrow, MD  [facility] 09/30/2023, 11:26 AM

## 2023-09-30 NOTE — Progress Notes (Addendum)
  Patient Name: Tina George Date of Encounter: 09/30/2023  Primary Cardiologist: None Electrophysiologist: None  Interval Summary   The patient reports she is doing overall well > does have soreness from her left shoulder post implant.  At this time, the patient denies chest pain, shortness of breath, or any new concerns.  Vital Signs    Vitals:   09/29/23 1756 09/29/23 1954 09/30/23 0416 09/30/23 0829  BP: (!) 140/55 (!) 158/65  (!) 169/61  Pulse: 66   60  Resp: 20 20 18 16   Temp:  97.7 F (36.5 C) 98.2 F (36.8 C) 97.9 F (36.6 C)  TempSrc:  Oral Oral Oral  SpO2: 100%   96%  Weight:      Height:        Intake/Output Summary (Last 24 hours) at 09/30/2023 1123 Last data filed at 09/30/2023 0933 Gross per 24 hour  Intake 410 ml  Output --  Net 410 ml   Filed Weights   09/27/23 1117  Weight: 64.4 kg    Physical Exam    GEN- The patient is well appearing, alert and oriented x 3 today.   Lungs- Clear to ausculation bilaterally, normal work of breathing Cardiac- Regular rate and rhythm, no murmurs, rubs or gallops GI- soft, NT, ND, + BS Extremities- no clubbing or cyanosis. No edema  Telemetry    AP 60-80's (personally reviewed)  Hospital Course    Tina George is a 77 y.o. female with HTN, HLD, COPD, PAD s/p recent RLE intervention admitted 09/27/23 for 1 week of increasing fatigue.  Seen by PCP 12/12 and HR was in the 30's, instructed to hold Metoprolol and referred to ER. S/p PPM on 12/16 per Dr. Ladona Ridgel.    Assessment & Plan    Symptomatic Bradycardia  Sinus Node Dysfunction  -s/p PPM on 12/16  -hold plavix for 7 days, ok to resume on 12/24 from EP perspective -wound care, arm restrictions, and follow up reviewed with patient -device will be followed by Dr. Lalla Brothers  -follow up arranged for patient for PPM -CXR final read pending, by personal review, leads in good position with no PTX      For questions or updates, please contact [facility] Please consult [email or web address] for contact info under Cardiology/STEMI.  Signed, Canary Brim, MSN, APRN, NP-C, AGACNP-BC [facility] - Electrophysiology  09/30/2023, 11:24 AM  EP Attending  Patient seen and examined. Agree with the findings as noted above. The patient is stable after PPM insertion. Her PM interrogation under my direction demonstrates normal DDD PM function. Incision is without hematoma. Tele with NSR and atrial pacing. She can be discharged home with followup with Dr. Lenell Antu next week to consider right leg revascularization.  Sharlot Gowda Cheral Cappucci,MD

## 2023-09-30 NOTE — TOC Initial Note (Signed)
Transition of Care [facility]) - Initial/Assessment Note    Patient Details  Name: Tina George MRN: [id number] Date of Birth: 04-28-1946  Transition of [facility]) CM/SW Contact:    Gala Lewandowsky, RN Phone Number: 09/30/2023, 11:15 AM  Clinical Narrative: Patient presented for symptomatic bradycardia and increasing fatigue-post pacemaker implant 09-29-23. PTA patient was independent from home with support of two children.No home needs identified at the time of the visit. Family will transport patient home via private vehicle once stable.   Expected Discharge Plan: Home/Self Care Barriers to Discharge: No Barriers Identified   Patient Goals and CMS Choice Patient states their goals for this hospitalization and ongoing recovery are:: Plan to return home with childrens support once stable.   Choice offered to / list presented to : NA  Expected Discharge Plan and Services In-house Referral: NA Discharge Planning Services: CM Consult Post Acute Care Choice: NA Living arrangements for the past 2 months: Single Family Home    Prior Living Arrangements/Services Living arrangements for the past 2 months: Single Family Home Lives with:: Adult Children Patient language and need for interpreter reviewed:: Yes Do you feel safe going back to the place where you live?: Yes      Need for Family Participation in Patient Care: Yes (Comment) Care giver support system in place?: Yes (comment)   Activities of Daily Living   ADL Screening (condition at time of admission) Independently performs ADLs?: Yes (appropriate for developmental age) Is the patient deaf or have difficulty hearing?: No Does the patient have difficulty seeing, even when wearing glasses/contacts?: No Does the patient have difficulty concentrating, remembering, or making decisions?: No  Permission Sought/Granted Permission sought to share information with : Case Manager, Family Supports     Emotional  Assessment   Attitude/Demeanor/Rapport: Engaged Affect (typically observed): Appropriate Orientation: : Oriented to Self, Oriented to Place, Oriented to  Time, Oriented to Situation Alcohol / Substance Use: Not Applicable Psych Involvement: No (comment)  Admission diagnosis:  Bradycardia [R00.1] Symptomatic bradycardia [R00.1] Chest pain, unspecified type [R07.9] Patient Active Problem List   Diagnosis Date Noted   Bradycardia 09/28/2023   Symptomatic bradycardia 09/27/2023   Postural lightheadedness 08/18/2023   Syncope and collapse 08/06/2023   Palpitations 08/06/2023   Other chest pain 09/15/2020   Bilateral leg edema 09/15/2020   SOB (shortness of breath) 09/15/2020   Daytime somnolence 09/15/2020   Murmur 09/15/2020   Precordial pain 09/15/2020   Medication management 09/15/2020   Pain in both lower extremities 09/15/2020   Snoring 09/15/2020   COPD (chronic obstructive pulmonary disease) (HCC)    Dyslipidemia    GERD (gastroesophageal reflux disease)    High blood pressure    Hypokalemia    Muscle cramps    Psoriasis    Anemia 09/20/2014   Hypercholesterolemia 09/20/2014   Osteoarthritis 09/20/2014   Vitamin D deficiency 09/20/2014   Asthma 05/06/2014   PCP:  Simone Curia, MD Pharmacy:   [facility] [phone] - [place], [place] - [address] [place] [address] Phone: [phone] Fax: [phone]  Social Drivers of Health (SDOH) Social History: SDOH Screenings   Food Insecurity: No Food Insecurity (09/28/2023)  Housing: Low Risk  (09/27/2023)  Transportation Needs: No Transportation Needs (09/27/2023)  Utilities: Not At Risk (09/27/2023)  Tobacco Use: High Risk (09/27/2023)   Readmission Risk Interventions     No data to display

## 2023-10-01 ENCOUNTER — Telehealth: Payer: Self-pay

## 2023-10-01 LAB — URINE CULTURE: Culture: >=100000 — AB

## 2023-10-01 NOTE — Telephone Encounter (Signed)
Follow-up after same day discharge: Implant date: 09/29/2023 MD: Lewayne Bunting, MD Device: [facility] Scientific Location: Left Chest   Wound check visit: 10/17/2023 90 day MD follow-up: 12/31/2023  Remote Transmission received:No I told pt to plug monitor in and send a transmission. Pt agreed.  Dressing/sling removed: Yes  Confirm OAC restart on: 10/06/2023  Please continue to monitor your cardiac device site for redness, swelling, and drainage. Call the device clinic at [phone] if you experience these symptoms, fever/chills, or have questions about your device.   Remote monitoring is used to monitor your cardiac device from home. This monitoring is scheduled every 91 days by our office. It allows [place] to keep an eye on the functioning of your device to ensure it is working properly.

## 2023-10-02 NOTE — Telephone Encounter (Signed)
Transmission received 10/01/2023.

## 2023-10-16 NOTE — Progress Notes (Signed)
  Electrophysiology Office Note:   Date:  10/17/2023  ID:  Tina George, DOB 1946-03-12, MRN [id number]  Primary Cardiologist: None Primary Heart Failure: None Electrophysiologist: OLE ONEIDA HOLTS, MD       History of Present Illness:   Tina George is a 78 y.o. female with h/o SND / symptomatic bradycardia s/p PPM, HTN, HLD, COPD, PAD s/p recent RLE intervention seen today for post implant wound & device check.   Admit 12/14-12/17/24 with symptomatic bradycardia.  She underwent placement of pacemaker on 09/29/23. The patient reports she removed the steri-strips after 24 hours.  Questioned about this and she confirms she thought she was supposed to remove them after 24h.  She denies drainage, warmth, swelling at site. She reports she feels so much better after having her device put in place. She states I thought I was going to die before being admitted.   Review of systems complete and found to be negative unless listed in HPI.   EP Information / Studies Reviewed:       PPM Interrogation-  reviewed in detail today,  See PACEART report.  Device History: Field Seismologist PPM implanted 09/29/23 for Symptomatic bradycardia      Physical Exam:   VS:  BP (!) 162/64   Pulse 60   Ht 5' 7 (1.702 m)   Wt 146 lb 12.8 oz (66.6 kg)   SpO2 94%   BMI 22.99 kg/m    Wt Readings from Last 3 Encounters:  10/17/23 146 lb 12.8 oz (66.6 kg)  09/27/23 142 lb (64.4 kg)  09/05/23 139 lb (63 kg)     Left chest implant site healing well, no edema, erythema or hematoma.  Steri-strips removed with adhesive remover.  Incision well approximated.   ASSESSMENT AND PLAN:    Symptomatic bradycardia s/p Environmental Manager PPM  -Normal PPM function -See Pace Art report -MV changed from 4 to 6 to match patient activity level -wound check within normal limits > no edema, erythema, hematoma or drainage  -reviewed lifting restrictions / post implant care with patient  Disposition:   Follow  up with Dr. Holts in 3 months  Signed, Daphne Barrack, MSN, APRN, NP-C, AGACNP-BC [facility] - Electrophysiology  10/17/2023, 1:18 PM

## 2023-10-17 ENCOUNTER — Ambulatory Visit: Payer: Medicare Other | Attending: Pulmonary Disease | Admitting: Pulmonary Disease

## 2023-10-17 VITALS — BP 162/64 | HR 60 | Ht 67.0 in | Wt 146.8 lb

## 2023-10-17 DIAGNOSIS — Z950 Presence of cardiac pacemaker: Secondary | ICD-10-CM | POA: Insufficient documentation

## 2023-10-17 DIAGNOSIS — R001 Bradycardia, unspecified: Secondary | ICD-10-CM | POA: Diagnosis present

## 2023-10-17 LAB — CUP PACEART INCLINIC DEVICE CHECK
Date Time Interrogation Session: 20250103132159
Implantable Lead Connection Status: 753985
Implantable Lead Connection Status: 753985
Implantable Lead Implant Date: 20241216
Implantable Lead Implant Date: 20241216
Implantable Lead Location: 753859
Implantable Lead Location: 753860
Implantable Lead Model: 7840
Implantable Lead Model: 7841
Implantable Lead Serial Number: 1122086
Implantable Lead Serial Number: 1532872
Implantable Pulse Generator Implant Date: 20241216
Lead Channel Impedance Value: 598 Ohm
Lead Channel Impedance Value: 699 Ohm
Lead Channel Pacing Threshold Amplitude: 0.8 V
Lead Channel Pacing Threshold Amplitude: 0.8 V
Lead Channel Pacing Threshold Pulse Width: 0.4 ms
Lead Channel Pacing Threshold Pulse Width: 0.4 ms
Lead Channel Sensing Intrinsic Amplitude: 25 mV
Lead Channel Sensing Intrinsic Amplitude: 8.6 mV
Lead Channel Setting Pacing Amplitude: 3.5 V
Lead Channel Setting Pacing Amplitude: 3.5 V
Lead Channel Setting Pacing Pulse Width: 0.4 ms
Lead Channel Setting Sensing Sensitivity: 2.5 mV
Pulse Gen Serial Number: 132101
Zone Setting Status: 755011

## 2023-10-17 NOTE — Patient Instructions (Signed)
 Medication Instructions:    Your physician recommends that you continue on your current medications as directed. Please refer to the Current Medication list given to you today.  *If you need a refill on your cardiac medications before your next appointment, please call your pharmacy*   Lab Work:  NONE ORDERED  TODAY   If you have labs (blood work) drawn today and your tests are completely normal, you will receive your results only by: MyChart Message (if you have MyChart) OR A paper copy in the mail If you have any lab test that is abnormal or we need to change your treatment, we will call you to review the results.   Testing/Procedures:  NONE ORDERED  TODAY    Follow-Up: At [facility], you and your health needs are our priority.  As part of our continuing mission to provide you with exceptional heart care, we have created designated Provider Care Teams.  These Care Teams include your primary Cardiologist (physician) and Advanced Practice Providers (APPs -  Physician Assistants and Nurse Practitioners) who all work together to provide you with the care you need, when you need it.  We recommend signing up for the patient portal called MyChart.  Sign up information is provided on this After Visit Summary.  MyChart is used to connect with patients for Virtual Visits (Telemedicine).  Patients are able to view lab/test results, encounter notes, upcoming appointments, etc.  Non-urgent messages can be sent to your provider as well.   To learn more about what you can do with MyChart, go to [email or web address].    Your next appointment:  AS SCHEDULED    Provider:  DAPHNE BARRACK    Other Instructions

## 2023-10-20 ENCOUNTER — Ambulatory Visit: Payer: Medicare Other

## 2023-10-20 VITALS — BP 136/70 | HR 60 | Ht 67.0 in | Wt 146.4 lb

## 2023-10-20 DIAGNOSIS — I495 Sick sinus syndrome: Secondary | ICD-10-CM | POA: Insufficient documentation

## 2023-10-20 DIAGNOSIS — E7800 Pure hypercholesterolemia, unspecified: Secondary | ICD-10-CM | POA: Insufficient documentation

## 2023-10-20 DIAGNOSIS — I10 Essential (primary) hypertension: Secondary | ICD-10-CM | POA: Insufficient documentation

## 2023-10-20 DIAGNOSIS — I739 Peripheral vascular disease, unspecified: Secondary | ICD-10-CM

## 2023-10-20 DIAGNOSIS — I2510 Atherosclerotic heart disease of native coronary artery without angina pectoris: Secondary | ICD-10-CM

## 2023-10-20 HISTORY — DX: Sick sinus syndrome: I49.5

## 2023-10-20 HISTORY — DX: Atherosclerotic heart disease of native coronary artery without angina pectoris: I25.10

## 2023-10-20 HISTORY — DX: Peripheral vascular disease, unspecified: I73.9

## 2023-10-20 MED ORDER — ROSUVASTATIN CALCIUM 40 MG PO TABS: 40.0000 mg | ORAL_TABLET | Freq: Every day | ORAL | 3 refills | Status: AC

## 2023-10-20 MED ORDER — CLOPIDOGREL BISULFATE 75 MG PO TABS: 75.0000 mg | ORAL_TABLET | Freq: Every day | ORAL | 3 refills | Status: DC

## 2023-10-20 NOTE — Assessment & Plan Note (Signed)
 Titrate up rosuvastatin dose to 40 mg once a day. Her last lipid panel available is from November 2024 total cholesterol 193, HDL 64, triglycerides 78.

## 2023-10-20 NOTE — Assessment & Plan Note (Addendum)
 Was symptomatic with slow heart rates at rest and poor heart rate response with ambulation. S/p permanent pacemaker implant while inpatient 09/29/2023. Doing well.  Wound healing well.  Feels significant improvement in her symptoms since the pacemaker implant.  Has established with care with device clinic and recommended to continue to follow-up regularly as recommended.

## 2023-10-20 NOTE — Patient Instructions (Signed)
 Medication Instructions:  Your physician has recommended you make the following change in your medication:   START: Crestor  40 mg daily  *If you need a refill on your cardiac medications before your next appointment, please call your pharmacy*   Lab Work: None If you have labs (blood work) drawn today and your tests are completely normal, you will receive your results only by: MyChart Message (if you have MyChart) OR A paper copy in the mail If you have any lab test that is abnormal or we need to change your treatment, we will call you to review the results.   Testing/Procedures: None   Follow-Up: At [facility], you and your health needs are our priority.  As part of our continuing mission to provide you with exceptional heart care, we have created designated Provider Care Teams.  These Care Teams include your primary Cardiologist (physician) and Advanced Practice Providers (APPs -  Physician Assistants and Nurse Practitioners) who all work together to provide you with the care you need, when you need it.  We recommend signing up for the patient portal called MyChart.  Sign up information is provided on this After Visit Summary.  MyChart is used to connect with patients for Virtual Visits (Telemedicine).  Patients are able to view lab/test results, encounter notes, upcoming appointments, etc.  Non-urgent messages can be sent to your provider as well.   To learn more about what you can do with MyChart, go to [email or web address].    Your next appointment:   6 month(s)  Provider:   Alean Kobus, MD    Other Instructions None

## 2023-10-20 NOTE — Assessment & Plan Note (Signed)
 Appearance reasonably controlled. Target below 130 over 80 mmHg. Your blood consistently above 130, would recommend starting calcium channel blocker such as amlodipine.

## 2023-10-20 NOTE — Assessment & Plan Note (Addendum)
 Remains asymptomatic at this time. Mentions no chest pain or shortness of breath. Symptoms improved since pacemaker implant.  Not on aspirin  due to reported allergy in the past with itching and rash. Continue Plavix  75 mg once daily. Titrate up rosuvastatin  dose to 40 mg once daily.

## 2023-10-20 NOTE — Assessment & Plan Note (Signed)
 Has ongoing bilateral lower extremity symptoms. No acute skin lesions on the feet bilaterally. Advised to closely follow-up with vascular surgeon and give their office a call if the symptoms are acutely worsening with regards to any rest pain or any open wounds on the legs.  From cardiac standpoint she is okay to proceed with any vascular intervention as needed.  She continues on clopidogrel  75 mg once daily. I am titrating up the dose of rosuvastatin  to 40 mg once a day today.

## 2023-10-20 NOTE — Progress Notes (Signed)
 Cardiology Consultation:    Date:  10/20/2023   ID:  Tina George, DOB 18-Oct-1945, MRN [id number]  PCP:  Tina Chow, MD  Cardiologist:  Tina SAUNDERS [name], MD   Referring MD: Tina Chow, MD   No chief complaint on file.    ASSESSMENT AND PLAN:   Tina George a 78 year old woman with history of moderate coronary artery disease on CT coronary angiogram August 2024 with mildly abnormal CT FFR and very distal LAD and OM territories being managed medically, sick sinus syndrome with symptomatic with bradycardia at rest and poor heart rate response s/p permanent pacemaker implant 09/29/2023, peripheral disease for which she follows up with vascular surgery hypertension, hyperlipidemia, CKD stage III, smokes tobacco, peripheral neuropathy, benign essential tremor, asthma/COPD, chronic venous insufficiency Problem List Items Addressed This Visit     High blood pressure   Appearance reasonably controlled. Target below 130 over 80 mmHg. Your blood consistently above 130, would recommend starting calcium  channel blocker such as amlodipine .       Relevant Medications   rosuvastatin  (CRESTOR ) 40 MG tablet   Hypercholesterolemia   Titrate up rosuvastatin  dose to 40 mg once a day. Her last lipid panel available is from November 2024 total cholesterol 193, HDL 64, triglycerides 78.       Relevant Medications   rosuvastatin  (CRESTOR ) 40 MG tablet   CAD (coronary artery disease), moderate nonobstructive disease CT coronaries August 2024, mildly abnormal CT FFR very distal LAD and OM territory, medically managed - Primary   Remains asymptomatic at this time. Mentions no chest pain or shortness of breath. Symptoms improved since pacemaker implant.  Not on aspirin  due to reported allergy in the past with itching and rash. Continue Plavix  75 mg once daily. Titrate up rosuvastatin  dose to 40 mg once daily.       Relevant Medications   rosuvastatin  (CRESTOR ) 40 MG tablet   Sick sinus  syndrome (HCC), symptomatic with bradycardia, s/p permanent pacemaker implant 09/29/2023   Was symptomatic with slow heart rates at rest and poor heart rate response with ambulation. S/p permanent pacemaker implant while inpatient 09/29/2023. Doing well.  Wound healing well.  Feels significant improvement in her symptoms since the pacemaker implant.  Has established with care with device clinic and recommended to continue to follow-up regularly as recommended.       Relevant Medications   rosuvastatin  (CRESTOR ) 40 MG tablet   Peripheral vascular disease, unspecified (HCC)   Has ongoing bilateral lower extremity symptoms. No acute skin lesions on the feet bilaterally. Advised to closely follow-up with vascular surgeon and give their office a call if the symptoms are acutely worsening with regards to any rest pain or any open wounds on the legs.  From cardiac standpoint she is okay to proceed with any vascular intervention as needed.  She continues on clopidogrel  75 mg once daily. I am titrating up the dose of rosuvastatin  to 40 mg once a day today.       Relevant Medications   rosuvastatin  (CRESTOR ) 40 MG tablet   Return to clinic for follow-up with me in 6 months. She was in the process of establishing care with a new PCP, encouraged to find one for appropriate refill on her noncardiac medications.   History of Present Illness:    Tina George is a 78 y.o. female who is being seen today for a follow-up visit.  Last visit with me in the office was 08/18/2023. PCP is Tina Chow, MD.   Tina George  woman with history of moderate coronary artery disease on CT angiogram August 2024 with mildly abnormal CT FFR and very distal LAD and OM territories likely due to tapering effect and medical therapy was recommended, hypertension, hyperlipidemia, CKD stage III, heavy smoker, peripheral neuropathy, benign essential tremor, peripheral vascular disease with abnormal ABI being managed medically  at this time and follows up with vascular surgeon, chronic venous insufficiency, asthma/COPD, presumed diastolic heart failure with normal LVEF on prior echocardiogram July 2023 with impaired relaxation function and symptoms of palpitations and postural lightheadedness with repeat echocardiogram normal 2024 with normal biventricular function and no significant valve abnormalities.  Event monitor 7 days study in October showed predominantly sinus rhythm and supraventricular ectopy burden 2.7% with short runs of SVT up to 12.1 seconds relatively asymptomatic.  December 14 she was admitted at [facility] with symptoms of aphasia in the setting of relatively low heart rates and exercise intolerance with heart rates in 40s to 50s, thought to be symptomatic secondary to his bradycardia and underwent permanent pacemaker implant 09/29/2023 for sinus node dysfunction after being evaluated by Dr. Cathlyn George, electrophysiologist.  Here for follow-up visit today.  Mentions overall she is feeling better. Denies any chest pain, shortness of breath, orthopnea, paroxysmal nocturnal dyspnea. Denies any lightheadedness, dizziness or syncopal episodes. Mentions she feels that she has more energy now when she is trying to be active.  She continues to have persistent bilateral lower extremity pain which is present even at rest extending up to the thigh.  Discomfort slightly worse on right side compared to left.  No skin breakdown or ulcers on the feet. She was seen by Tina George, vascular surgeon while inpatient in December and plans to follow-up early in February. I advised her to keep in close touch with their office and notify them promptly if her symptoms are worsening in the lower extremities notably with any kind of worsening pain at rest or any open wounds in the legs.  She has cut down on her smoking and now down to 2 cigarettes a day and is motivated to quit.  Denies any blood in urine or  stools.    Past Medical History:  Diagnosis Date   Anemia 09/20/2014   Asthma 05/06/2014   Bilateral leg edema 09/15/2020   Bradycardia 09/28/2023   COPD (chronic obstructive pulmonary disease) (HCC)    Daytime somnolence 09/15/2020   Dyslipidemia    GERD (gastroesophageal reflux disease)    GERD (gastroesophageal reflux disease)    High blood pressure    Hypercholesterolemia 09/20/2014   Hypokalemia    Medication management 09/15/2020   Murmur 09/15/2020   Muscle cramps    Osteoarthritis 09/20/2014   Other chest pain 09/15/2020   Pain in both lower extremities 09/15/2020   Palpitations 08/06/2023   Postural lightheadedness 08/18/2023   Precordial pain 09/15/2020   Psoriasis    Snoring 09/15/2020   SOB (shortness of breath) 09/15/2020   Symptomatic bradycardia 09/27/2023   Syncope and collapse 08/06/2023   Vitamin D deficiency 09/20/2014    Past Surgical History:  Procedure Laterality Date   ABDOMINAL AORTOGRAM W/LOWER EXTREMITY Right 09/05/2023   Procedure: ABDOMINAL AORTOGRAM W/LOWER EXTREMITY;  Surgeon: Magda [name], MD;  Location: [facility];  Service: Cardiovascular;  Laterality: Right;   ABDOMINAL HYSTERECTOMY     APPENDECTOMY     arthroscopic shoulder surgery     BACK SURGERY     CATARACT EXTRACTION, BILATERAL     CHOLECYSTECTOMY  MASTECTOMY     MASTOIDECTOMY     PACEMAKER IMPLANT N/A 09/29/2023   Procedure: PACEMAKER IMPLANT - DUAL CHAMBER;  Surgeon: Waddell Danelle ORN, MD;  Location: [facility];  Service: Cardiovascular;  Laterality: N/A;   PARTIAL COLECTOMY     PERIPHERAL VASCULAR INTERVENTION Right 09/05/2023   Procedure: PERIPHERAL VASCULAR INTERVENTION;  Surgeon: Magda [name], MD;  Location: [facility];  Service: Cardiovascular;  Laterality: Right;  SFA    Current Medications: Current Meds  Medication Sig   albuterol  (PROVENTIL  HFA;VENTOLIN  HFA) 108 (90 Base) MCG/ACT inhaler Inhale 1-2 puffs into the lungs as needed  for wheezing or shortness of breath.   ALPRAZolam  (XANAX ) 0.25 MG tablet Take 0.25 mg by mouth every 6 (six) hours as needed for anxiety.   Budeson-Glycopyrrol-Formoterol  (BREZTRI  AEROSPHERE) 160-9-4.8 MCG/ACT AERO Inhale 2 puffs into the lungs in the morning and at bedtime.   furosemide  (LASIX ) 40 MG tablet Take 40 mg by mouth 2 (two) times daily as needed for fluid.   gabapentin  (NEURONTIN ) 300 MG capsule Take 300 mg by mouth at bedtime.   ibuprofen  (ADVIL ,MOTRIN ) 200 MG tablet Take 200 mg by mouth every 8 (eight) hours as needed for pain.   ipratropium-albuterol  (DUONEB) 0.5-2.5 (3) MG/3ML SOLN Take 3 mLs by nebulization every 4 (four) hours as needed for wheezing or shortness of breath.   Multiple Vitamin (MULTIVITAMIN WITH MINERALS) TABS tablet Take 1 tablet by mouth in the morning. Multivitamin for Women   nitroGLYCERIN  (NITROSTAT ) 0.4 MG SL tablet Place 1 tablet (0.4 mg total) under the tongue every 5 (five) minutes as needed for chest pain.   PARoxetine  (PAXIL ) 40 MG tablet Take 40 mg by mouth every morning.   rosuvastatin  (CRESTOR ) 40 MG tablet Take 1 tablet (40 mg total) by mouth daily.   traMADol (ULTRAM) 50 MG tablet Take 50 mg by mouth 3 (three) times daily as needed for moderate pain (pain score 4-6) or severe pain (pain score 7-10).   [DISCONTINUED] clopidogrel  (PLAVIX ) 75 MG tablet Take 1 tablet (75 mg total) by mouth daily.   [DISCONTINUED] rosuvastatin  (CRESTOR ) 20 MG tablet Take 1 tablet (20 mg total) by mouth daily.     Allergies:   Aspirin , Levaquin [levofloxacin in d5w], and Codeine   Social History   Socioeconomic History   Marital status: Widowed    Spouse name: Not on file   Number of children: 2   Years of education: Not on file   Highest education level: Not on file  Occupational History   Not on file  Tobacco Use   Smoking status: Every Day    Current packs/day: 0.50    Types: Cigarettes   Smokeless tobacco: Never   Tobacco comments:    1-2 cigs daily   Substance and Sexual Activity   Alcohol use: Never   Drug use: Never   Sexual activity: Not on file  Other Topics Concern   Not on file  Social History Narrative   Not on file   Social Drivers of Health   Financial Resource Strain: Not on file  Food Insecurity: No Food Insecurity (09/28/2023)   Hunger Vital Sign    Worried About Running Out of Food in the Last Year: Never true    Ran Out of Food in the Last Year: Never true  Transportation Needs: No Transportation Needs (09/27/2023)   PRAPARE - Administrator, Civil Service (Medical): No    Lack of Transportation (Non-Medical): No  Physical Activity:  Not on file  Stress: Not on file  Social Connections: Not on file     Family History: The patient's family history includes Heart disease in her father; Hypertension in her father; Pancreatic cancer in her brother, mother, and sister. ROS:   Please see the history of present illness.    All 14 point review of systems negative except as described per history of present illness.  EKGs/Labs/Other Studies Reviewed:    The following studies were reviewed today:   EKG:       Recent Labs: 05/26/2023: NT-Pro BNP 847 09/27/2023: TSH 1.584 09/29/2023: ALT 9; BUN 15; Creatinine, Ser 1.38; Hemoglobin 9.3; Magnesium  2.0; Platelets 161; Potassium 3.5; Sodium 141  Recent Lipid Panel No results found for: CHOL, TRIG, HDL, CHOLHDL, VLDL, LDLCALC, LDLDIRECT  Physical Exam:    VS:  BP 136/70   Pulse 60   Ht 5' 7 (1.702 m)   Wt 146 lb 6.4 oz (66.4 kg)   SpO2 95%   BMI 22.93 kg/m     Wt Readings from Last 3 Encounters:  10/20/23 146 lb 6.4 oz (66.4 kg)  10/17/23 146 lb 12.8 oz (66.6 kg)  09/27/23 142 lb (64.4 kg)     GENERAL:  Well nourished, well developed in no acute distress NECK: No JVD; No carotid bruits Left infraclavicular pacemaker generator site appears to show appropriate healing of the skin. CARDIAC: RRR, S1 and S2 present, no murmurs, no  rubs, no gallops CHEST:  Clear to auscultation without rales, wheezing or rhonchi  Extremities: No pitting pedal edema.  Radial pulses 2+ bilaterally symmetric.  Distal dorsalis pedis pulses not palpable.  NEUROLOGIC:  Alert and oriented x 3  Medication Adjustments/Labs and Tests Ordered: Current medicines are reviewed at length with the patient today.  Concerns regarding medicines are outlined above.  No orders of the defined types were placed in this encounter.  Meds ordered this encounter  Medications   rosuvastatin  (CRESTOR ) 40 MG tablet    Sig: Take 1 tablet (40 mg total) by mouth daily.    Dispense:  90 tablet    Refill:  3   clopidogrel  (PLAVIX ) 75 MG tablet    Sig: Take 1 tablet (75 mg total) by mouth daily.    Dispense:  90 tablet    Refill:  3    Signed, Shaquon Gropp reddy Rande Dario, MD, MPH, [facility]. 10/20/2023 1:29 PM    [facility]

## 2023-10-21 ENCOUNTER — Other Ambulatory Visit: Payer: Self-pay

## 2023-10-21 DIAGNOSIS — I70213 Atherosclerosis of native arteries of extremities with intermittent claudication, bilateral legs: Secondary | ICD-10-CM

## 2023-10-28 ENCOUNTER — Ambulatory Visit (INDEPENDENT_AMBULATORY_CARE_PROVIDER_SITE_OTHER): Payer: Medicare Other | Admitting: Vascular Surgery

## 2023-10-28 ENCOUNTER — Encounter: Payer: Self-pay | Admitting: Vascular Surgery

## 2023-10-28 ENCOUNTER — Ambulatory Visit (INDEPENDENT_AMBULATORY_CARE_PROVIDER_SITE_OTHER): Payer: Medicare Other

## 2023-10-28 VITALS — BP 187/92 | HR 74 | Ht 67.0 in | Wt 145.0 lb

## 2023-10-28 DIAGNOSIS — I70213 Atherosclerosis of native arteries of extremities with intermittent claudication, bilateral legs: Secondary | ICD-10-CM

## 2023-10-28 DIAGNOSIS — I739 Peripheral vascular disease, unspecified: Secondary | ICD-10-CM

## 2023-10-28 LAB — VAS US ABI WITH/WO TBI
Left ABI: 0.59
Right ABI: 0.45

## 2023-10-28 NOTE — Progress Notes (Signed)
 [facility] [place]  ASSESSMENT / PLAN: Tina George is a 78 y.o. female with atherosclerosis of native arteries of bilateral lower extremities she underwent right lower extremity femoral-popliteal angioplasty and stenting 09/05/2023.  This unfortunately has thrombosed.  Recommend:  Abstinence from all tobacco products. Blood glucose control with goal A1c < 7%. Blood pressure control with goal blood pressure < 140/90 mmHg. Lipid reduction therapy with goal LDL-C <100 mg/dL  Aspirin  81mg  PO QD.  Atorvastatin 40-80mg  PO QD (or other high intensity statin therapy).  Given the lack of any symptomatic improvement after stenting, and the silent occlusion she suffered, I am concerned patient's lower extremity symptoms may be from neurogenic claudication. Her next step from a vascular perspective would be a femoral-popliteal bypass, and so I am hesitant to recommend this until other causes of her pain have been excluded. I encouraged her to discuss this with her primary care physician at her appointment tomorrow.  Looking at her CT scan from October, she does have significant degenerative lumbar disease. Follow up with me in 3 months to review her symptoms.   CHIEF COMPLAINT: Lower extremity pain  HISTORY OF PRESENT ILLNESS: Tina George is a 78 y.o. female referred to clinic for wound of the right lower extremity in the setting of swelling.  Patient reports this wound has healed.  She is significantly bothered by swelling in her bilateral lower extremities.  She also reports significant discomfort with walking.  She reports cramping pain in her calves that begins prior to getting to her mailbox.  The pain is relieved by rest.  She does not describe constant, severe pain in her feet.  The ulcer for which she was referred has healed, thankfully.  She has no new ulcers about her feet.  07/22/23: Patient returns to clinic for surveillance of claudication symptoms.  The  patient reports the symptoms are disabling.  She is not able to shop and grocery store care for home because of her symptoms.  The patient also has some lower back pain, but does not report radiating discomfort down the back of her leg, symptoms when standing for long period of time, or positional component to her pain.  The patient endorses severe cramping discomfort with walking, especially uphill or for long distances.  09/27/23: admitted to the hospital for evaluation of symptomatic bradycardia. The patient reports poor exercise tolerance and low energy levels. Her heart rate has been as low as the 30s. Her blood pressure has been depressed as well. Workup has been initiated and she is told me she is likely going to get a pacemaker. The patient has noticed persistent right proximal thigh discomfort since stenting procedure 09/05/2023. This is not severe, but noticeable. The patient reports no problems with walking with the right leg. She does not have any kind of rest pain symptoms in the right leg. She has no ulcers on the right foot. Workup in the hospital showed thrombosis of right leg stenting.  10/28/23: Patient returns to clinic.  She is feeling better after pacemaker placement.  Her legs are still bothersome to her.  The patient reports to me that she has pain with standing for prolonged period of time and with walking.  She does not describe any relief after her stenting procedure.  Past Medical History:  Diagnosis Date   Anemia 09/20/2014   Asthma 05/06/2014   Bilateral leg edema 09/15/2020   Bradycardia 09/28/2023   COPD (chronic obstructive pulmonary disease) (HCC)  Daytime somnolence 09/15/2020   Dyslipidemia    GERD (gastroesophageal reflux disease)    GERD (gastroesophageal reflux disease)    High blood pressure    Hypercholesterolemia 09/20/2014   Hypokalemia    Medication management 09/15/2020   Murmur 09/15/2020   Muscle cramps    Osteoarthritis 09/20/2014   Other chest  pain 09/15/2020   Pain in both lower extremities 09/15/2020   Palpitations 08/06/2023   Postural lightheadedness 08/18/2023   Precordial pain 09/15/2020   Psoriasis    Snoring 09/15/2020   SOB (shortness of breath) 09/15/2020   Symptomatic bradycardia 09/27/2023   Syncope and collapse 08/06/2023   Vitamin D deficiency 09/20/2014    Past Surgical History:  Procedure Laterality Date   ABDOMINAL AORTOGRAM W/LOWER EXTREMITY Right 09/05/2023   Procedure: ABDOMINAL AORTOGRAM W/LOWER EXTREMITY;  Surgeon: Magda [name], MD;  Location: [facility];  Service: Cardiovascular;  Laterality: Right;   ABDOMINAL HYSTERECTOMY     APPENDECTOMY     arthroscopic shoulder surgery     BACK SURGERY     CATARACT EXTRACTION, BILATERAL     CHOLECYSTECTOMY     MASTECTOMY     MASTOIDECTOMY     PACEMAKER IMPLANT N/A 09/29/2023   Procedure: PACEMAKER IMPLANT - DUAL CHAMBER;  Surgeon: Waddell Danelle ORN, MD;  Location: [facility];  Service: Cardiovascular;  Laterality: N/A;   PARTIAL COLECTOMY     PERIPHERAL VASCULAR INTERVENTION Right 09/05/2023   Procedure: PERIPHERAL VASCULAR INTERVENTION;  Surgeon: Magda [name], MD;  Location: [facility];  Service: Cardiovascular;  Laterality: Right;  SFA    Family History  Problem Relation Age of Onset   Pancreatic cancer Mother    Hypertension Father    Heart disease Father    Pancreatic cancer Sister    Pancreatic cancer Brother     Social History   Socioeconomic History   Marital status: Widowed    Spouse name: Not on file   Number of children: 2   Years of education: Not on file   Highest education level: Not on file  Occupational History   Not on file  Tobacco Use   Smoking status: Every Day    Current packs/day: 0.50    Types: Cigarettes   Smokeless tobacco: Never   Tobacco comments:    1-2 cigs daily  Vaping Use   Vaping status: Never Used  Substance and Sexual Activity   Alcohol use: Never   Drug use: Never    Sexual activity: Not on file  Other Topics Concern   Not on file  Social History Narrative   Not on file   Social Drivers of Health   Financial Resource Strain: Not on file  Food Insecurity: No Food Insecurity (09/28/2023)   Hunger Vital Sign    Worried About Running Out of Food in the Last Year: Never true    Ran Out of Food in the Last Year: Never true  Transportation Needs: No Transportation Needs (09/27/2023)   PRAPARE - Administrator, Civil Service (Medical): No    Lack of Transportation (Non-Medical): No  Physical Activity: Not on file  Stress: Not on file  Social Connections: Not on file  Intimate Partner Violence: Not At Risk (09/27/2023)   Humiliation, Afraid, Rape, and Kick questionnaire    Fear of Current or Ex-Partner: No    Emotionally Abused: No    Physically Abused: No    Sexually Abused: No    Allergies  Allergen Reactions  Aspirin  Itching, Swelling and Other (See Comments)    Tongue swelling   Levaquin [Levofloxacin In D5w] Other (See Comments)    Severe yeast reaction.   Codeine Rash    Current Outpatient Medications  Medication Sig Dispense Refill   albuterol  (PROVENTIL  HFA;VENTOLIN  HFA) 108 (90 Base) MCG/ACT inhaler Inhale 1-2 puffs into the lungs as needed for wheezing or shortness of breath.     ALPRAZolam  (XANAX ) 0.25 MG tablet Take 0.25 mg by mouth every 6 (six) hours as needed for anxiety.     Budeson-Glycopyrrol-Formoterol  (BREZTRI  AEROSPHERE) 160-9-4.8 MCG/ACT AERO Inhale 2 puffs into the lungs in the morning and at bedtime. 42.8 g 0   clopidogrel  (PLAVIX ) 75 MG tablet Take 1 tablet (75 mg total) by mouth daily. 90 tablet 3   furosemide  (LASIX ) 40 MG tablet Take 40 mg by mouth 2 (two) times daily as needed for fluid.     gabapentin  (NEURONTIN ) 300 MG capsule Take 300 mg by mouth at bedtime.     ibuprofen  (ADVIL ,MOTRIN ) 200 MG tablet Take 200 mg by mouth every 8 (eight) hours as needed for pain.     ipratropium-albuterol  (DUONEB)  0.5-2.5 (3) MG/3ML SOLN Take 3 mLs by nebulization every 4 (four) hours as needed for wheezing or shortness of breath.     Multiple Vitamin (MULTIVITAMIN WITH MINERALS) TABS tablet Take 1 tablet by mouth in the morning. Multivitamin for Women     nitroGLYCERIN  (NITROSTAT ) 0.4 MG SL tablet Place 1 tablet (0.4 mg total) under the tongue every 5 (five) minutes as needed for chest pain. 25 tablet 5   PARoxetine  (PAXIL ) 40 MG tablet Take 40 mg by mouth every morning.     rosuvastatin  (CRESTOR ) 40 MG tablet Take 1 tablet (40 mg total) by mouth daily. 90 tablet 3   traMADol (ULTRAM) 50 MG tablet Take 50 mg by mouth 3 (three) times daily as needed for moderate pain (pain score 4-6) or severe pain (pain score 7-10).     No current facility-administered medications for this visit.    PHYSICAL EXAM Vitals:   10/28/23 1107  BP: (!) 187/92  Pulse: 74  Weight: 145 lb (65.8 kg)  Height: 5' 7 (1.702 m)     Elderly woman in no distress Regular rate and rhythm Unlabored breathing No palpable femoral pulses No palpable pedal pulses 2+ edema about the calves, ankles, and proximal feet typical of chronic venous insufficiency  PERTINENT LABORATORY AND RADIOLOGIC DATA  Most recent CBC    Latest Ref Rng & Units 09/29/2023    4:29 AM 09/27/2023    3:40 PM 09/27/2023   11:23 AM  CBC  WBC 4.0 - 10.5 K/uL 7.7  10.7  10.4   Hemoglobin 12.0 - 15.0 g/dL 9.3  89.0  89.4   Hematocrit 36.0 - 46.0 % 29.3  34.0  33.5   Platelets 150 - 400 K/uL 161  192  188      Most recent CMP    Latest Ref Rng & Units 09/29/2023    4:29 AM 09/27/2023    3:40 PM 09/27/2023   11:23 AM  CMP  Glucose 70 - 99 mg/dL 899   94   BUN 8 - 23 mg/dL 15   17   Creatinine 9.55 - 1.00 mg/dL 8.61  8.69  8.82   Sodium 135 - 145 mmol/L 141   137   Potassium 3.5 - 5.1 mmol/L 3.5   3.3   Chloride 98 - 111 mmol/L 108   106  CO2 22 - 32 mmol/L 25   24   Calcium  8.9 - 10.3 mg/dL 8.7   8.5   Total Protein 6.5 - 8.1 g/dL 5.4      Total Bilirubin <1.2 mg/dL 0.7     Alkaline Phos 38 - 126 U/L 63     AST 15 - 41 U/L 11     ALT 0 - 44 U/L 9       +-------+-----------+-----------+------------+------------+  ABI/TBIToday's ABIToday's TBIPrevious ABIPrevious TBI  +-------+-----------+-----------+------------+------------+  Right 0.45       0.38       0.38        0.24          +-------+-----------+-----------+------------+------------+  Left  0.59       0.32       0.58        0.3           +-------+-----------+-----------+------------+------------+   [name]. Magda, MD FACS [facility] Phone Number: [phone] 10/28/2023 2:03 PM   Total time spent on preparing this encounter including chart review, data review, collecting history, examining the patient, coordinating care for this established patient, 40 minutes.  Portions of this report may have been transcribed using voice recognition software.  Every effort has been made to ensure accuracy; however, inadvertent computerized transcription errors may still be present.

## 2023-11-17 ENCOUNTER — Ambulatory Visit (HOSPITAL_COMMUNITY)
Admission: RE | Admit: 2023-11-17 | Discharge: 2023-11-17 | Disposition: A | Discharge status: Home / Self Care | Payer: Medicare Other | Source: Ambulatory Visit | Attending: Pulmonary Disease | Admitting: Pulmonary Disease

## 2023-11-17 DIAGNOSIS — R911 Solitary pulmonary nodule: Secondary | ICD-10-CM | POA: Insufficient documentation

## 2023-11-25 ENCOUNTER — Encounter (HOSPITAL_COMMUNITY): Payer: Medicare Other

## 2023-11-25 ENCOUNTER — Ambulatory Visit: Payer: Medicare Other | Admitting: Vascular Surgery

## 2023-11-25 ENCOUNTER — Telehealth: Payer: Self-pay
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
## 2023-11-26 ENCOUNTER — Telehealth: Payer: Self-pay

## 2023-11-26 NOTE — Telephone Encounter (Signed)
Spoke with pt. She reported that her HR has been in the 150's and BP has been up. She saw Dr. Nedra Hai today and he recommended that she call Dr. Vincent Gros. Dr. Bing Matter recommended that she come for nurse visit for EKG. Sent to front desk to schedule nurse visit.

## 2023-11-26 NOTE — Telephone Encounter (Signed)
STAT if HR is under 50 or over 120 (normal HR is 60-100 beats per minute)  What is your heart rate? 152, 129  Do you have a log of your heart rate readings (document readings)? No   Do you have any other symptoms? Fast heartbeat; pre-syncope; SOB; headache; feels strange

## 2023-11-27 ENCOUNTER — Ambulatory Visit: Payer: Medicare Other

## 2023-11-27 ENCOUNTER — Telehealth: Payer: Self-pay

## 2023-11-27 ENCOUNTER — Ambulatory Visit: Payer: Medicare Other | Attending: Cardiology

## 2023-11-27 VITALS — BP 140/80 | HR 96 | Ht 67.0 in | Wt 138.0 lb

## 2023-11-27 DIAGNOSIS — R000 Tachycardia, unspecified: Secondary | ICD-10-CM

## 2023-11-27 DIAGNOSIS — I10 Essential (primary) hypertension: Secondary | ICD-10-CM

## 2023-11-27 DIAGNOSIS — I2510 Atherosclerotic heart disease of native coronary artery without angina pectoris: Secondary | ICD-10-CM

## 2023-11-27 MED ORDER — METOPROLOL TARTRATE 25 MG PO TABS: 25.0000 mg | ORAL_TABLET | Freq: Two times a day (BID) | ORAL | 3 refills | Status: DC

## 2023-11-27 NOTE — Telephone Encounter (Signed)
Tried calling patient again for her to send a transmission, LVM for pt to call back

## 2023-11-27 NOTE — Progress Notes (Signed)
Reviewed EKG sinus rhythm heart rate 96/min. She was having episodes of fast heart rates reportedly at home. Recent remote device check from 10-17-2023 of her pacemaker reviewed described 1 episode of atrial tachycardia/SVT lasting few seconds.  Recommended to request a repeat remote check to see if there are any SVT/atrial tachycardia captured.  Start low-dose metoprolol tartrate 25 mg twice daily Will also obtain a Zio patch for 7 days

## 2023-11-27 NOTE — Progress Notes (Signed)
   Nurse Visit   Date of Encounter: 11/27/2023 ID: Tina George, DOB May 30, 1946, MRN [id number]  PCP:  Simone Curia, MD   [facility] Providers Cardiologist:  None Electrophysiologist:  Lanier Prude, MD      Visit Details   VS:  BP (!) 140/80 (BP Location: Right Arm, Patient Position: Sitting, Cuff Size: Normal)   Pulse 96   Ht 5\' 7"  (1.702 m)   Wt 138 lb (62.6 kg)   SpO2 96%   BMI 21.61 kg/m  , BMI Body mass index is 21.61 kg/m.  Wt Readings from Last 3 Encounters:  11/27/23 138 lb (62.6 kg)  10/28/23 145 lb (65.8 kg)  10/20/23 146 lb 6.4 oz (66.4 kg)     Reason for visit: Perform EKG Performed today: Vitals, EKG, Education and Provider consulted Changes (medications, testing, etc.) : Start Metoprolol and a long term heart monitor was applied to the patient . See Dr. Vincent Gros in 1 month. Remote pacer check was requested.  Length of Visit: 25 minutes    Medications Adjustments/Labs and Tests Ordered: Orders Placed This Encounter  Procedures   LONG TERM MONITOR (3-14 DAYS)   EKG 12-Lead   Meds ordered this encounter  Medications   metoprolol tartrate (LOPRESSOR) 25 MG tablet    Sig: Take 1 tablet (25 mg total) by mouth 2 (two) times daily.    Dispense:  180 tablet    Refill:  3     Signed, Samson Frederic, RN  11/27/2023 5:19 PM

## 2023-12-22 NOTE — Telephone Encounter (Signed)
 Lvm again for pt to send transmission will send letter

## 2023-12-23 ENCOUNTER — Telehealth: Payer: Self-pay

## 2023-12-23 NOTE — Telephone Encounter (Signed)
 Left vm to return call.

## 2023-12-23 NOTE — Telephone Encounter (Signed)
-----   Message from [place] R Madireddy sent at 12/23/2023  9:33 AM EDT ----- Please inform her the test results from her heart monitor show predominantly normal heart rhythm with average heart rate in 90s.  Rare instance of extra beats from top and bottom chambers of the heart are observed.  There were a few times where she had extra beats occurring from the top chambers of the heart, lasting back-to-back for 10 seconds.  These episodes were without any associated symptoms.  The various times she however reported the symptoms mostly correlated with regular heart rhythm and isolated extra beats from top or bottom chambers of the heart.  Overall these results are reassuring.  Given the relative higher average heart rate and episodes of back-to-back ectopic beats from top chambers of the heart, I would recommend titrating up metoprolol tartrate to 50 mg twice daily. Thank you

## 2023-12-24 ENCOUNTER — Telehealth: Payer: Self-pay

## 2023-12-24 MED ORDER — METOPROLOL TARTRATE 50 MG PO TABS: 50.0000 mg | ORAL_TABLET | Freq: Two times a day (BID) | ORAL | 3 refills | Status: DC

## 2023-12-24 NOTE — Telephone Encounter (Signed)
 Results reviewed with pt as per Dr. Madireddy's note.  Pt verbalized understanding and had no additional questions. Routed to PCP

## 2023-12-24 NOTE — Telephone Encounter (Signed)
-----   Message from [place] R Madireddy sent at 12/23/2023  9:33 AM EDT ----- Please inform her the test results from her heart monitor show predominantly normal heart rhythm with average heart rate in 90s.  Rare instance of extra beats from top and bottom chambers of the heart are observed.  There were a few times where she had extra beats occurring from the top chambers of the heart, lasting back-to-back for 10 seconds.  These episodes were without any associated symptoms.  The various times she however reported the symptoms mostly correlated with regular heart rhythm and isolated extra beats from top or bottom chambers of the heart.  Overall these results are reassuring.  Given the relative higher average heart rate and episodes of back-to-back ectopic beats from top chambers of the heart, I would recommend titrating up metoprolol tartrate to 50 mg twice daily. Thank you

## 2023-12-25 ENCOUNTER — Ambulatory Visit: Payer: Medicare Other

## 2023-12-25 ENCOUNTER — Telehealth: Payer: Self-pay

## 2023-12-25 VITALS — BP 160/74 | HR 93 | Ht 67.0 in | Wt 135.6 lb

## 2023-12-25 DIAGNOSIS — I2510 Atherosclerotic heart disease of native coronary artery without angina pectoris: Secondary | ICD-10-CM | POA: Insufficient documentation

## 2023-12-25 DIAGNOSIS — I739 Peripheral vascular disease, unspecified: Secondary | ICD-10-CM | POA: Diagnosis present

## 2023-12-25 NOTE — Assessment & Plan Note (Addendum)
 With significant increase in her right lower extremity symptoms associated with discoloration which she mentions has been relatively worse over the last 2 to 3 weeks.  Advised her to probably call her vascular surgeon's office for a follow-up visit. If the pains acutely worsen head to the ER right away. Continue with Plavix 75 mg once daily and rosuvastatin 40 mg once daily.  Okay to proceed with any vascular surgery from cardiac standpoint as required.

## 2023-12-25 NOTE — Telephone Encounter (Signed)
 Triage: -received message pt is calling because she saw her cardiologist today who noted discoloration of foot/ankle without a pulse and he told her to call her vascular doctor right away. -returned call to pt who states it has been like that for several weeks, that her foot is cold. She states that it gets better sometimes. pt advised to go directly to the ED but insisted that she can just get an appointment to see Dr. Lenell Antu.   Explained that according to the assessment and descriptions, it is possible she will loose her leg.  Pt finally agrees to report to the ED.  -Called daughter Tresa Endo with concern that her mother lacked understanding.  Tresa Endo is aware her Mom's foot is purple/black/cold and reportedly w/o a pulse.  She states she is not going to go the hospital and I explained its probable for her to loose her leg and she states she is aware but she can't make her go but will try.

## 2023-12-25 NOTE — Patient Instructions (Addendum)
 Call Dr. Verita Lamb office ASAP to speak with him about your leg discoloration and pain.   Medication Instructions:  Your physician recommends that you continue on your current medications as directed. Please refer to the Current Medication list given to you today.  *If you need a refill on your cardiac medications before your next appointment, please call your pharmacy*   Lab Work: None Ordered If you have labs (blood work) drawn today and your tests are completely normal, you will receive your results only by: MyChart Message (if you have MyChart) OR A paper copy in the mail If you have any lab test that is abnormal or we need to change your treatment, we will call you to review the results.   Testing/Procedures: None Ordered   Follow-Up: At [facility], you and your health needs are our priority.  As part of our continuing mission to provide you with exceptional heart care, we have created designated Provider Care Teams.  These Care Teams include your primary Cardiologist (physician) and Advanced Practice Providers (APPs -  Physician Assistants and Nurse Practitioners) who all work together to provide you with the care you need, when you need it.  We recommend signing up for the patient portal called "MyChart".  Sign up information is provided on this After Visit Summary.  MyChart is used to connect with patients for Virtual Visits (Telemedicine).  Patients are able to view lab/test results, encounter notes, upcoming appointments, etc.  Non-urgent messages can be sent to your provider as well.   To learn more about what you can do with MyChart, go to [email or web address].    Your next appointment:   6 month follow up

## 2023-12-25 NOTE — Progress Notes (Signed)
 Cardiology Consultation:    Date:  12/25/2023   ID:  Tina George, DOB [birth year], MRN [id number]  PCP:  Tina Curia, MD  Cardiologist:  Tina Corporal Crew Goren, MD   Referring MD: Tina Curia, MD   Chief Complaint  Patient presents with   R foot redness/ discoloration    pain     ASSESSMENT AND PLAN:   Tina George 78/F with h/o Moderate coronary artery disease on cardiac CT August 2024 with abnormal CT FFR of very distal LAD and OM territories being medically managed,  normal biventricular function and diastolic function without significant valve abnormalities on echocardiogram 08/2023, sick sinus syndrome with symptomatic bradycardia and poor heart rate response s/p permanent pacemaker implant 09/29/2023, peripheral arterial disease s/p right lower extremity femoral-popliteal angioplasty and stenting November 2024 that unfortunately thrombosed-follows up with vascular surgeon [last follow-up with Dr. Lenell George  October 28, 2023], hypertension, hyperlipidemia, CKD stage III, smokes tobacco, peripheral neuropathy, benign essential tremor, asthma/COPD, chronic venous insufficiency.  Appears to have baseline elevated heart rates in the setting of ongoing leg pain, recent flulike symptoms noted to have tachycardia at office visit, metoprolol tartrate was started and Zio patch 14 days completed showed elevated average sinus heart rates in 90s without significant ectopy burden and 13 short runs of SVT longest was up to 10 seconds.  Problem List Items Addressed This Visit     CAD (coronary artery disease), moderate nonobstructive disease CT coronaries August 2024, mildly abnormal CT FFR very distal LAD and OM territory, medically managed - Primary   She appears to be stable from cardiac standpoint at this time.  Continue current medications with Plavix 75 mg once daily, rosuvastatin 40 mg once daily and metoprolol tartrate 50 mg twice daily.        Peripheral vascular disease, unspecified (HCC)    With significant increase in her right lower extremity symptoms associated with discoloration which she mentions has been relatively worse over the last 2 to 3 weeks.  Advised her to probably call her vascular surgeon's office for a follow-up visit. If the pains acutely worsen head to the ER right away. Continue with Plavix 75 mg once daily and rosuvastatin 40 mg once daily.  Okay to proceed with any vascular surgery from cardiac standpoint as required.      Return to clinic tentatively in 6 months or as needed.   History of Present Illness:    Tina George is a 78 y.o. female who is being seen today for follow-up visit. Last office visit with me was 1 9 03/2024. PCP is Tina Curia, MD.  Moderate coronary artery disease on cardiac CT August 2024 with abnormal CT FFR of very distal LAD and OM territories being medically managed, sick sinus syndrome with symptomatic bradycardia and poor heart rate response s/p permanent pacemaker implant 09/29/2023, peripheral arterial disease-follows up with vascular surgeon, hypertension, hyperlipidemia, CKD stage III, smokes tobacco, peripheral neuropathy, benign essential tremor, asthma/COPD, chronic venous insufficiency, normal biventricular function and diastolic function without significant valve abnormalities on echocardiogram 08/2023.  She contacted our office 11-26-2023 reported spontaneous palpitations with heart rates up to 150 bpm and also noted elevated blood pressures.  We recommended heart monitor and to start metoprolol titrate 25 mg twice daily.  Zio patch for 14 days February 2025 noted predominantly sinus rhythm with average heart rate 93/min [ranging from 61 bpm to 157 bpm], rare ventricular and supraventricular ectopy burden, short runs of supraventricular tachycardia with the longest lasting 10.6 seconds. Triggered  events were observed 20 times and correlated mostly with sinus rhythm and at times with sinus tachycardia and isolated  ventricular and supraventricular ectopic beats.  These were reviewed and recommended to titrate up metoprolol further to 50 mg twice daily.   Here mentions that for the last 2 to 3 weeks she has been having discomfort in the right lower extremity.  She has had right lower extremity pain for many months attributed to her peripheral vascular issue and she underwent right femoral-popliteal angioplasty November 2024 with out significant improvement.  Follows up with vascular surgeon closely.  Recently for the past 3 weeks there is significant discoloration of the right lower extremity mentions this was much darker few days ago.  She describes pain down the right foot.  There is no visible swelling but there is bluish discoloration.  She continues to smoke on and off couple cigarettes a day.  Denies any chest pain.  Does have shortness of breath with mild to moderate exertion but no significant change over the many months since her coronary angiogram.     Past Medical History:  Diagnosis Date   Anemia 09/20/2014   Asthma 05/06/2014   Bilateral leg edema 09/15/2020   Bradycardia 09/28/2023   COPD (chronic obstructive pulmonary disease) (HCC)    Daytime somnolence 09/15/2020   Dyslipidemia    GERD (gastroesophageal reflux disease)    GERD (gastroesophageal reflux disease)    High blood pressure    Hypercholesterolemia 09/20/2014   Hypokalemia    Medication management 09/15/2020   Murmur 09/15/2020   Muscle cramps    Osteoarthritis 09/20/2014   Other chest pain 09/15/2020   Pain in both lower extremities 09/15/2020   Palpitations 08/06/2023   Postural lightheadedness 08/18/2023   Precordial pain 09/15/2020   Psoriasis    Snoring 09/15/2020   SOB (shortness of breath) 09/15/2020   Symptomatic bradycardia 09/27/2023   Syncope and collapse 08/06/2023   Vitamin D deficiency 09/20/2014    Past Surgical History:  Procedure Laterality Date   ABDOMINAL AORTOGRAM W/LOWER EXTREMITY Right  09/05/2023   Procedure: ABDOMINAL AORTOGRAM W/LOWER EXTREMITY;  Surgeon: [name], MD;  Location: [facility];  Service: Cardiovascular;  Laterality: Right;   ABDOMINAL HYSTERECTOMY     APPENDECTOMY     arthroscopic shoulder surgery     BACK SURGERY     CATARACT EXTRACTION, BILATERAL     CHOLECYSTECTOMY     MASTECTOMY     MASTOIDECTOMY     PACEMAKER IMPLANT N/A 09/29/2023   Procedure: PACEMAKER IMPLANT - DUAL CHAMBER;  Surgeon: Marinus Maw, MD;  Location: [facility];  Service: Cardiovascular;  Laterality: N/A;   PARTIAL COLECTOMY     PERIPHERAL VASCULAR INTERVENTION Right 09/05/2023   Procedure: PERIPHERAL VASCULAR INTERVENTION;  Surgeon: [name], MD;  Location: [facility];  Service: Cardiovascular;  Laterality: Right;  SFA    Current Medications: Current Meds  Medication Sig   albuterol (PROVENTIL HFA;VENTOLIN HFA) 108 (90 Base) MCG/ACT inhaler Inhale 1-2 puffs into the lungs as needed for wheezing or shortness of breath.   ALPRAZolam (XANAX) 0.25 MG tablet Take 0.25 mg by mouth every 6 (six) hours as needed for anxiety.   Budeson-Glycopyrrol-Formoterol (BREZTRI AEROSPHERE) 160-9-4.8 MCG/ACT AERO Inhale 2 puffs into the lungs in the morning and at bedtime.   clopidogrel (PLAVIX) 75 MG tablet Take 1 tablet (75 mg total) by mouth daily.   doxycycline (ADOXA) 100 MG tablet Take 100 mg by mouth 2 (two)  times daily.   furosemide (LASIX) 40 MG tablet Take 40 mg by mouth 2 (two) times daily as needed for fluid.   gabapentin (NEURONTIN) 300 MG capsule Take 300 mg by mouth at bedtime.   ibuprofen (ADVIL,MOTRIN) 200 MG tablet Take 200 mg by mouth every 8 (eight) hours as needed for pain.   ipratropium-albuterol (DUONEB) 0.5-2.5 (3) MG/3ML SOLN Take 3 mLs by nebulization every 4 (four) hours as needed for wheezing or shortness of breath.   metoprolol tartrate (LOPRESSOR) 50 MG tablet Take 1 tablet (50 mg total) by mouth 2 (two) times daily.   Multiple  Vitamin (MULTIVITAMIN WITH MINERALS) TABS tablet Take 1 tablet by mouth in the morning. Multivitamin for Women   nitroGLYCERIN (NITROSTAT) 0.4 MG SL tablet Place 1 tablet (0.4 mg total) under the tongue every 5 (five) minutes as needed for chest pain.   PARoxetine (PAXIL) 40 MG tablet Take 40 mg by mouth every morning.   rosuvastatin (CRESTOR) 40 MG tablet Take 1 tablet (40 mg total) by mouth daily.   traMADol (ULTRAM) 50 MG tablet Take 50 mg by mouth 3 (three) times daily as needed for moderate pain (pain score 4-6) or severe pain (pain score 7-10).   [DISCONTINUED] metoprolol tartrate (LOPRESSOR) 25 MG tablet Take 1 tablet (25 mg total) by mouth 2 (two) times daily.     Allergies:   Aspirin, Levaquin [levofloxacin in d5w], and Codeine   Social History   Socioeconomic History   Marital status: Widowed    Spouse name: Not on file   Number of children: 2   Years of education: Not on file   Highest education level: Not on file  Occupational History   Not on file  Tobacco Use   Smoking status: Every Day    Current packs/day: 0.50    Types: Cigarettes   Smokeless tobacco: Never   Tobacco comments:    1-2 cigs daily  Vaping Use   Vaping status: Never Used  Substance and Sexual Activity   Alcohol use: Never   Drug use: Never   Sexual activity: Not on file  Other Topics Concern   Not on file  Social History Narrative   Not on file   Social Drivers of Health   Financial Resource Strain: Not on file  Food Insecurity: No Food Insecurity (09/28/2023)   Hunger Vital Sign    Worried About Running Out of Food in the Last Year: Never true    Ran Out of Food in the Last Year: Never true  Transportation Needs: No Transportation Needs (09/27/2023)   PRAPARE - Administrator, Civil Service (Medical): No    Lack of Transportation (Non-Medical): No  Physical Activity: Not on file  Stress: Not on file  Social Connections: Not on file     Family History: The patient's  family history includes Heart disease in her father; Hypertension in her father; Pancreatic cancer in her brother, mother, and sister. ROS:   Please see the history of present illness.    All 14 point review of systems negative except as described per history of present illness.  EKGs/Labs/Other Studies Reviewed:    The following studies were reviewed today:   EKG:       Recent Labs: 05/26/2023: NT-Pro BNP 847 09/27/2023: TSH 1.584 09/29/2023: ALT 9; BUN 15; Creatinine, Ser 1.38; Hemoglobin 9.3; Magnesium 2.0; Platelets 161; Potassium 3.5; Sodium 141  Recent Lipid Panel No results found for: "CHOL", "TRIG", "HDL", "CHOLHDL", "VLDL", "LDLCALC", "LDLDIRECT"  Physical  Exam:    VS:  BP (!) 160/74 (BP Location: Right Arm, Patient Position: Sitting)   Pulse 93   Ht 5\' 7"  (1.702 m)   Wt 135 lb 9.6 oz (61.5 kg)   SpO2 99%   BMI 21.24 kg/m     Wt Readings from Last 3 Encounters:  12/25/23 135 lb 9.6 oz (61.5 kg)  11/27/23 138 lb (62.6 kg)  10/28/23 145 lb (65.8 kg)     GENERAL:  Well nourished, well developed in no acute distress NECK: No JVD; No carotid bruits CARDIAC: RRR, S1 and S2 present, no murmurs, no rubs, no gallops CHEST:  Clear to auscultation without rales, wheezing or rhonchi  Extremities: No pitting pedal edema.  Bluish purplish discoloration of the right lower extremity extending few centimeters above the ankle. NEUROLOGIC:  Alert and oriented x 3  Medication Adjustments/Labs and Tests Ordered: Current medicines are reviewed at length with the patient today.  Concerns regarding medicines are outlined above.  No orders of the defined types were placed in this encounter.  No orders of the defined types were placed in this encounter.   Signed, Cecille Amsterdam, MD, MPH, [facility]. 12/25/2023 10:49 AM    [facility]

## 2023-12-25 NOTE — Assessment & Plan Note (Signed)
 She appears to be stable from cardiac standpoint at this time.  Continue current medications with Plavix 75 mg once daily, rosuvastatin 40 mg once daily and metoprolol tartrate 50 mg twice daily.

## 2023-12-26 ENCOUNTER — Emergency Department (HOSPITAL_COMMUNITY)

## 2023-12-26 ENCOUNTER — Encounter (HOSPITAL_COMMUNITY): Payer: Self-pay

## 2023-12-26 ENCOUNTER — Other Ambulatory Visit: Payer: Self-pay

## 2023-12-26 ENCOUNTER — Other Ambulatory Visit: Payer: Self-pay | Admitting: Vascular Surgery

## 2023-12-26 ENCOUNTER — Emergency Department (HOSPITAL_COMMUNITY)
Admission: EM | Admit: 2023-12-26 | Discharge: 2023-12-26 | Disposition: A | Discharge status: Home / Self Care | Attending: Emergency Medicine | Admitting: Emergency Medicine

## 2023-12-26 DIAGNOSIS — M79604 Pain in right leg: Secondary | ICD-10-CM

## 2023-12-26 DIAGNOSIS — I739 Peripheral vascular disease, unspecified: Secondary | ICD-10-CM | POA: Insufficient documentation

## 2023-12-26 DIAGNOSIS — R200 Anesthesia of skin: Secondary | ICD-10-CM | POA: Diagnosis present

## 2023-12-26 DIAGNOSIS — Z7901 Long term (current) use of anticoagulants: Secondary | ICD-10-CM | POA: Diagnosis not present

## 2023-12-26 DIAGNOSIS — Z950 Presence of cardiac pacemaker: Secondary | ICD-10-CM | POA: Diagnosis not present

## 2023-12-26 DIAGNOSIS — Z79899 Other long term (current) drug therapy: Secondary | ICD-10-CM | POA: Diagnosis not present

## 2023-12-26 DIAGNOSIS — J449 Chronic obstructive pulmonary disease, unspecified: Secondary | ICD-10-CM | POA: Diagnosis not present

## 2023-12-26 DIAGNOSIS — I2510 Atherosclerotic heart disease of native coronary artery without angina pectoris: Secondary | ICD-10-CM | POA: Insufficient documentation

## 2023-12-26 DIAGNOSIS — I10 Essential (primary) hypertension: Secondary | ICD-10-CM | POA: Diagnosis not present

## 2023-12-26 LAB — CBC WITH DIFFERENTIAL/PLATELET
Abs Immature Granulocytes: 0.02 10*3/uL (ref 0.00–0.07)
Basophils Absolute: 0 10*3/uL (ref 0.0–0.1)
Basophils Relative: 0 %
Eosinophils Absolute: 0.2 10*3/uL (ref 0.0–0.5)
Eosinophils Relative: 2 %
HCT: 37.4 % (ref 36.0–46.0)
Hemoglobin: 12.3 g/dL (ref 12.0–15.0)
Immature Granulocytes: 0 %
Lymphocytes Relative: 19 %
Lymphs Abs: 1.4 10*3/uL (ref 0.7–4.0)
MCH: 31.8 pg (ref 26.0–34.0)
MCHC: 32.9 g/dL (ref 30.0–36.0)
MCV: 96.6 fL (ref 80.0–100.0)
Monocytes Absolute: 0.7 10*3/uL (ref 0.1–1.0)
Monocytes Relative: 10 %
Neutro Abs: 5 10*3/uL (ref 1.7–7.7)
Neutrophils Relative %: 69 %
Platelets: 181 10*3/uL (ref 150–400)
RBC: 3.87 MIL/uL (ref 3.87–5.11)
RDW: 15.9 % — ABNORMAL HIGH (ref 11.5–15.5)
WBC: 7.3 10*3/uL (ref 4.0–10.5)
nRBC: 0 % (ref 0.0–0.2)

## 2023-12-26 LAB — COMPREHENSIVE METABOLIC PANEL
ALT: 12 U/L (ref 0–44)
AST: 16 U/L (ref 15–41)
Albumin: 3.3 g/dL — ABNORMAL LOW (ref 3.5–5.0)
Alkaline Phosphatase: 64 U/L (ref 38–126)
Anion gap: 11 (ref 5–15)
BUN: 18 mg/dL (ref 8–23)
CO2: 24 mmol/L (ref 22–32)
Calcium: 9.3 mg/dL (ref 8.9–10.3)
Chloride: 107 mmol/L (ref 98–111)
Creatinine, Ser: 1.34 mg/dL — ABNORMAL HIGH (ref 0.44–1.00)
GFR, Estimated: 41 mL/min — ABNORMAL LOW (ref >60)
Glucose, Bld: 124 mg/dL — ABNORMAL HIGH (ref 70–99)
Potassium: 3.8 mmol/L (ref 3.5–5.1)
Sodium: 142 mmol/L (ref 135–145)
Total Bilirubin: 0.8 mg/dL (ref 0.0–1.2)
Total Protein: 6 g/dL — ABNORMAL LOW (ref 6.5–8.1)

## 2023-12-26 LAB — I-STAT CG4 LACTIC ACID, ED
Lactic Acid, Venous: 2 mmol/L (ref 0.5–1.9)
Lactic Acid, Venous: 0.7 mmol/L (ref 0.5–1.9)

## 2023-12-26 MED ORDER — SODIUM CHLORIDE 0.9 % IV BOLUS
500.0000 mL | Freq: Once | INTRAVENOUS | Status: AC
  Administered 2023-12-26: 500 mL via INTRAVENOUS

## 2023-12-26 MED ORDER — OXYCODONE-ACETAMINOPHEN 5-325 MG PO TABS: 1.0000 | ORAL_TABLET | ORAL | 0 refills | Status: DC | PRN
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
## 2023-12-26 NOTE — Progress Notes (Signed)
 RLE arterial duplex and ABI have been completed.  Preliminary results given to Dr. Rush Landmark.   Results can be found under chart review under CV PROC. 12/26/2023 4:49 PM Shayon Trompeter RVT, RDMS

## 2023-12-26 NOTE — ED Provider Notes (Signed)
 [place] EMERGENCY DEPARTMENT AT [facility] Provider Note   CSN: [id number] Arrival date & time: 12/26/23  1032     History  Chief Complaint  Patient presents with   Circulatory Problem    Tina George is a 78 y.o. female with PMHx COPD, HLD, GERD, HTN, OA, CAD, PVD, sick sinus syndrome s/p pacemaker, LE stent 08/2023 which is now thrombosed, who presents to ED concerned for worsening discoloration of right foot and worsening numbness/paresthesias x3 weeks. Patient stating that her outpatient cardiologist could not find her LE pulses yesterday during and appointment so they referred patient to ED. Patient without any infectious symptoms today.  HPI     Home Medications Prior to Admission medications   Medication Sig Start Date End Date Taking? Authorizing Provider  albuterol (PROVENTIL HFA;VENTOLIN HFA) 108 (90 Base) MCG/ACT inhaler Inhale 1-2 puffs into the lungs as needed for wheezing or shortness of breath.    [provider]  ALPRAZolam Prudy Feeler) 0.25 MG tablet Take 0.25 mg by mouth every 6 (six) hours as needed for anxiety. 07/04/20   [provider]  Budeson-Glycopyrrol-Formoterol (BREZTRI AEROSPHERE) 160-9-4.8 MCG/ACT AERO Inhale 2 puffs into the lungs in the morning and at bedtime. 12/29/20   Martina Sinner, MD  clopidogrel (PLAVIX) 75 MG tablet Take 1 tablet (75 mg total) by mouth daily. 10/20/23   Madireddy, Marlyn Corporal, MD  doxycycline (ADOXA) 100 MG tablet Take 100 mg by mouth 2 (two) times daily.    [provider]  furosemide (LASIX) 40 MG tablet Take 40 mg by mouth 2 (two) times daily as needed for fluid.    [provider]  gabapentin (NEURONTIN) 300 MG capsule Take 300 mg by mouth at bedtime. 05/08/23   [provider]  ibuprofen (ADVIL,MOTRIN) 200 MG tablet Take 200 mg by mouth every 8 (eight) hours as needed for pain.    [provider]  ipratropium-albuterol (DUONEB) 0.5-2.5 (3) MG/3ML SOLN Take 3 mLs  by nebulization every 4 (four) hours as needed for wheezing or shortness of breath. 08/25/20   [provider]  metoprolol tartrate (LOPRESSOR) 50 MG tablet Take 1 tablet (50 mg total) by mouth 2 (two) times daily. 12/24/23   Georgeanna Lea, MD  Multiple Vitamin (MULTIVITAMIN WITH MINERALS) TABS tablet Take 1 tablet by mouth in the morning. Multivitamin for Women    [provider]  nitroGLYCERIN (NITROSTAT) 0.4 MG SL tablet Place 1 tablet (0.4 mg total) under the tongue every 5 (five) minutes as needed for chest pain. 05/30/23   Flossie Dibble, NP  PARoxetine (PAXIL) 40 MG tablet Take 40 mg by mouth every morning. 11/17/20   [provider]  rosuvastatin (CRESTOR) 40 MG tablet Take 1 tablet (40 mg total) by mouth daily. 10/20/23   Madireddy, Marlyn Corporal, MD  traMADol (ULTRAM) 50 MG tablet Take 50 mg by mouth 3 (three) times daily as needed for moderate pain (pain score 4-6) or severe pain (pain score 7-10). 07/25/23   [provider]      Allergies    Aspirin, Levaquin [levofloxacin in d5w], and Codeine    Review of Systems   Review of Systems  Musculoskeletal:        Vascular problem    Physical Exam Updated Vital Signs BP (!) 182/85 (BP Location: Right Arm)   Pulse 84   Temp 98.3 F (36.8 C)   Resp 16   Ht 5\' 7"  (1.702 m)   Wt 61.2 kg  SpO2 99%   BMI 21.14 kg/m  Physical Exam Vitals and nursing note reviewed.  Constitutional:      General: She is not in acute distress.    Appearance: She is not ill-appearing or toxic-appearing.  HENT:     Head: Normocephalic and atraumatic.     Mouth/Throat:     Mouth: Mucous membranes are moist.  Eyes:     General: No scleral icterus.       Right eye: No discharge.        Left eye: No discharge.     Conjunctiva/sclera: Conjunctivae normal.  Cardiovascular:     Rate and Rhythm: Normal rate and regular rhythm.     Pulses: Normal pulses.     Heart sounds: Normal heart sounds. No murmur  heard. Pulmonary:     Effort: Pulmonary effort is normal. No respiratory distress.     Breath sounds: Normal breath sounds. No wheezing, rhonchi or rales.  Abdominal:     General: Abdomen is flat.  Musculoskeletal:     Right lower leg: No edema.     Left lower leg: No edema.     Comments: BL LE with purple discoloration - worse on the right foot. Numbness of right foot. Pulses dopplered by nurse. Sluggish capillary refill. +2 pedal pulse palpated of left foot.  Skin:    General: Skin is warm and dry.     Findings: No rash.  Neurological:     General: No focal deficit present.     Mental Status: She is alert and oriented to person, place, and time. Mental status is at baseline.  Psychiatric:        Mood and Affect: Mood normal.     ED Results / Procedures / Treatments   Labs (all labs ordered are listed, but only abnormal results are displayed) Labs Reviewed  COMPREHENSIVE METABOLIC PANEL  CBC WITH DIFFERENTIAL/PLATELET  I-STAT CG4 LACTIC ACID, ED    EKG None  Radiology No results found.  Procedures Procedures    Medications Ordered in ED Medications - No data to display  ED Course/ Medical Decision Making/ A&P                                 Medical Decision Making Amount and/or Complexity of Data Reviewed Labs: ordered.   This patient presents to the ED for concern of vascular problem, this involves an extensive number of treatment options, and is a complaint that carries with it a high risk of complications and morbidity.  The differential diagnosis includes ischemic limb, PVD/PAD, venous insufficiency   Co morbidities that complicate the patient evaluation  COPD, HLD, GERD, HTN, OA, CAD, PVD, sick sinus syndrome s/p pacemaker, LE stent 08/2023 which is now thrombosed   Additional history obtained:  Dr. Nedra Hai PCP   Lab Tests:  I Ordered, and personally interpreted labs.   - CBC: no leukocytosis or anemia - CMP: Cr elevated near patient's baseline  at 1.34 - LA: 2.0 down trending to 0.7 after NS   Consultations Obtained:  I requested consultation with the on-call vascular surgeon Dr. Lenell Antu,  and discussed pertinent plan - they recommend: ABI and arterial duplex US   Problem List / ED Course / Critical interventions / Medication management  PMHx atherosclerosis of native arteries of bilateral lower extremities s/p right lower extremity femoral-popliteal angioplasty and stenting 09/05/2023 - which has thrombosed.  Patient presents to ED concerned for  worsening discoloration of right foot along with worsening numbness/paresthesias x3 weeks. Outpatient Cardiologist referred patient here because they were unable to find patient's pulse.  Physical exam with dopplerable pulses, but sluggish refill of the right foot. There is also purple discoloration of BL legs - worse on the right.  Consulted Dr. Lenell Antu who recommended US imaging and states that plan will probably be to set patient up with outpatient angiogram. I appreciate his help. I have reviewed the patients home medicines and have made adjustments as needed   Social Determinants of Health:  geriatric  3PM Care of Tina George transferred to [facility] at the end of my shift as the patient will require reassessment once labs/imaging have resulted. Patient presentation, ED course, and plan of care discussed with review of all pertinent labs and imaging. Please see his/her note for further details regarding further ED course and disposition. Plan at time of handoff is reassess patient after US imaging results and after vascular consult note is posted. Patient should be eligible for outpatient follow up. This may be altered or completely changed at the discretion of the oncoming team pending results of further workup.          Final Clinical Impression(s) / ED Diagnoses Final diagnoses:  None    Rx / DC Orders ED Discharge Orders     None         Dorthy Cooler, [place] 12/26/23 1523    Terald Sleeper, MD 12/26/23 1753

## 2023-12-26 NOTE — ED Provider Notes (Signed)
 Pt concerned with right leg discoloration and numbness. Follow up scan and vascular Dr. Lenell Antu recommendations Physical Exam  BP 139/81   Pulse 81   Temp 98.4 F (36.9 C) (Oral)   Resp 18   Ht 5\' 7"  (1.702 m)   Wt 61.2 kg   SpO2 100%   BMI 21.14 kg/m   Physical Exam Vitals and nursing note reviewed.  Constitutional:      Appearance: Normal appearance.  HENT:     Head: Atraumatic.  Cardiovascular:     Comments: DP pulses obtained with doppler bilaterally  Pulmonary:     Effort: Pulmonary effort is normal.  Skin:    Comments: Pale discolored skin of the bilateral lower extremities  Neurological:     General: No focal deficit present.     Mental Status: She is alert.     Comments: Decreased sensation to the right toes, intact sensation to the left lower extremity  Psychiatric:        Mood and Affect: Mood normal.        Behavior: Behavior normal.     Procedures  Procedures  ED Course / MDM    Medical Decision Making Amount and/or Complexity of Data Reviewed Labs: ordered.    Patient received signout.  Please see prior provider note for full details.  In short, patient presenting with increased numbness in her right toes and worsening discoloration.  She does have a history of known peripheral arterial disease.  Vascular ultrasound of the right lower extremity and ABI was obtained .  ABI of the right lower extremities indicates severe peripheral arterial disease.  There was severe stenosis also noted on her ultrasound with 50 to 74% stenosis of the deep femoral artery on the right lower extremity.  The femoral-popliteal stent appeared occluded.  Dr. Lenell Antu, her vascular doctor was consulted.  He recommended patient follow-up outpatient with him next week to obtain angiogram for further evaluation.  He prescribed her Percocet for pain.  I discussed this with patient.  Return precautions given.  Patient discharged home.             Tina Merles,  PA-C 12/26/23 1924    Tegeler, Canary Brim, MD 12/26/23 [phone]

## 2023-12-26 NOTE — ED Notes (Signed)
 Both DP and PT pulses able to be heard with doppler in right foot.

## 2023-12-26 NOTE — Discharge Instructions (Addendum)
 Dr. Lenell Antu recommended you follow-up with him in the office next week for an angiogram.  Please call the office to schedule this appointment.  I have included his contact information below.  Dr. Lenell Antu prescribed you Percocet to take for pain. This is a narcotic/controlled substance medication that has potential addicting qualities.  You may take 1 tablet every 4-6 hours as needed for severe pain.  Do not drive or operate heavy machinery when taking this medicine as it can be sedating. Do not drink alcohol or take other sedating medications when taking this medicine for safety reasons.  Keep this out of reach of small children.    Return to the ER for any severe increase in pain, complete numbness in the foot, further discoloration, any other new or concerning symptoms.

## 2023-12-26 NOTE — ED Triage Notes (Signed)
 Pt states she has had purplish discoloration in right foot for past 3 weeks. Pt is nauseated. Pt has decreased sensation, numbness. Pt has cap refill <3 sec.

## 2023-12-27 LAB — VAS US ABI WITH/WO TBI
Left ABI: 0.65
Right ABI: 0.48

## 2023-12-29 ENCOUNTER — Other Ambulatory Visit: Payer: Self-pay

## 2023-12-29 DIAGNOSIS — I739 Peripheral vascular disease, unspecified: Secondary | ICD-10-CM

## 2023-12-30 ENCOUNTER — Ambulatory Visit (INDEPENDENT_AMBULATORY_CARE_PROVIDER_SITE_OTHER): Payer: Medicare Other

## 2023-12-30 DIAGNOSIS — I495 Sick sinus syndrome: Secondary | ICD-10-CM

## 2023-12-30 NOTE — Progress Notes (Deleted)
  Electrophysiology Office Note:   Date:  12/30/2023  ID:  [name], DOB October 06, 1946, MRN [id number]  Primary Cardiologist: None Primary Heart Failure: None Electrophysiologist: Lanier Prude, MD  {Click to update primary MD,subspecialty MD or APP then REFRESH:1}    History of Present Illness:   Tina George is a 78 y.o. female with h/o SND / symptomatic bradycardia s/p PPM, HTN, HLD, CAD, PVD, COPD, PAD s/p recent RLE intervention seen today for routine electrophysiology followup.   Since last being seen in our clinic the patient reports doing ***.  she denies chest pain, palpitations, dyspnea, PND, orthopnea, nausea, vomiting, dizziness, syncope, edema, weight gain, or early satiety.   Review of systems complete and found to be negative unless listed in HPI.   EP Information / Studies Reviewed:    EKG is not ordered today. EKG from 11/27/23 reviewed which showed NSR 96 bpm      PPM Interrogation-  reviewed in detail today,  See PACEART report.  Device History: Field seismologist PPM implanted 09/29/23 for Symptomatic bradycardia  Risk Assessment/Calculations:     No BP recorded.  {Refresh Note OR Click here to enter BP  :1}***        Physical Exam:   VS:  There were no vitals taken for this visit.   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 Lalla Brothers {EPFOLLOW WU:98119}  Signed, Canary Brim, NP-C, AGACNP-BC [facility] - Electrophysiology  12/30/2023, 1:35 PM

## 2023-12-31 ENCOUNTER — Ambulatory Visit: Payer: Medicare Other | Admitting: Pulmonary Disease

## 2023-12-31 ENCOUNTER — Other Ambulatory Visit: Payer: Self-pay

## 2023-12-31 ENCOUNTER — Ambulatory Visit (HOSPITAL_BASED_OUTPATIENT_CLINIC_OR_DEPARTMENT_OTHER)

## 2023-12-31 ENCOUNTER — Encounter (HOSPITAL_COMMUNITY)
Admission: RE | Disposition: A | Discharge status: Home / Self Care | Payer: Self-pay | Source: Home / Self Care | Attending: Vascular Surgery

## 2023-12-31 ENCOUNTER — Ambulatory Visit (HOSPITAL_COMMUNITY)
Admission: RE | Admit: 2023-12-31 | Discharge: 2023-12-31 | Disposition: A | Discharge status: Home / Self Care | Attending: Vascular Surgery | Admitting: Vascular Surgery

## 2023-12-31 DIAGNOSIS — M62262 Nontraumatic ischemic infarction of muscle, left lower leg: Secondary | ICD-10-CM

## 2023-12-31 DIAGNOSIS — F17210 Nicotine dependence, cigarettes, uncomplicated: Secondary | ICD-10-CM | POA: Diagnosis not present

## 2023-12-31 DIAGNOSIS — I70221 Atherosclerosis of native arteries of extremities with rest pain, right leg: Secondary | ICD-10-CM | POA: Insufficient documentation

## 2023-12-31 DIAGNOSIS — T82856A Stenosis of peripheral vascular stent, initial encounter: Secondary | ICD-10-CM | POA: Insufficient documentation

## 2023-12-31 DIAGNOSIS — Z95820 Peripheral vascular angioplasty status with implants and grafts: Secondary | ICD-10-CM | POA: Diagnosis not present

## 2023-12-31 DIAGNOSIS — Z91148 Patient's other noncompliance with medication regimen for other reason: Secondary | ICD-10-CM | POA: Diagnosis not present

## 2023-12-31 DIAGNOSIS — Y832 Surgical operation with anastomosis, bypass or graft as the cause of abnormal reaction of the patient, or of later complication, without mention of misadventure at the time of the procedure: Secondary | ICD-10-CM | POA: Diagnosis not present

## 2023-12-31 DIAGNOSIS — I739 Peripheral vascular disease, unspecified: Secondary | ICD-10-CM

## 2023-12-31 DIAGNOSIS — Z7902 Long term (current) use of antithrombotics/antiplatelets: Secondary | ICD-10-CM | POA: Insufficient documentation

## 2023-12-31 DIAGNOSIS — Z79899 Other long term (current) drug therapy: Secondary | ICD-10-CM | POA: Diagnosis not present

## 2023-12-31 HISTORY — PX: LOWER EXTREMITY ANGIOGRAPHY: CATH118251

## 2023-12-31 HISTORY — PX: ABDOMINAL AORTOGRAM W/LOWER EXTREMITY: CATH118223

## 2023-12-31 LAB — CUP PACEART REMOTE DEVICE CHECK
Battery Remaining Longevity: 168 mo
Battery Remaining Percentage: 100 %
Brady Statistic RA Percent Paced: 17 %
Brady Statistic RV Percent Paced: 0 %
Date Time Interrogation Session: 20250318030100
Implantable Lead Connection Status: 753985
Implantable Lead Connection Status: 753985
Implantable Lead Implant Date: 20241216
Implantable Lead Implant Date: 20241216
Implantable Lead Location: 753859
Implantable Lead Location: 753860
Implantable Lead Model: 7840
Implantable Lead Model: 7841
Implantable Lead Serial Number: 1122086
Implantable Lead Serial Number: 1532872
Implantable Pulse Generator Implant Date: 20241216
Lead Channel Impedance Value: 587 Ohm
Lead Channel Impedance Value: 731 Ohm
Lead Channel Pacing Threshold Amplitude: 0.7 V
Lead Channel Pacing Threshold Amplitude: 0.9 V
Lead Channel Pacing Threshold Pulse Width: 0.4 ms
Lead Channel Pacing Threshold Pulse Width: 0.4 ms
Lead Channel Setting Pacing Amplitude: 3.5 V
Lead Channel Setting Pacing Amplitude: 3.5 V
Lead Channel Setting Pacing Pulse Width: 0.4 ms
Lead Channel Setting Sensing Sensitivity: 2.5 mV
Pulse Gen Serial Number: 132101
Zone Setting Status: 755011

## 2023-12-31 LAB — POCT I-STAT, CHEM 8
BUN: 21 mg/dL (ref 8–23)
Calcium, Ion: 1.23 mmol/L (ref 1.15–1.40)
Chloride: 105 mmol/L (ref 98–111)
Creatinine, Ser: 1.3 mg/dL — ABNORMAL HIGH (ref 0.44–1.00)
Glucose, Bld: 77 mg/dL (ref 70–99)
HCT: 37 % (ref 36.0–46.0)
Hemoglobin: 12.6 g/dL (ref 12.0–15.0)
Potassium: 3.4 mmol/L — ABNORMAL LOW (ref 3.5–5.1)
Sodium: 143 mmol/L (ref 135–145)
TCO2: 27 mmol/L (ref 22–32)

## 2023-12-31 SURGERY — ABDOMINAL AORTOGRAM W/LOWER EXTREMITY
Anesthesia: LOCAL

## 2023-12-31 MED ORDER — MIDAZOLAM HCL 2 MG/2ML IJ SOLN
INTRAMUSCULAR | Status: DC | PRN
  Administered 2023-12-31: 1 mg via INTRAVENOUS

## 2023-12-31 MED ORDER — SODIUM CHLORIDE 0.9 % IV SOLN: 250.0000 mL | INTRAVENOUS | Status: DC | PRN

## 2023-12-31 MED ORDER — ACETAMINOPHEN 325 MG PO TABS: 650.0000 mg | ORAL_TABLET | ORAL | Status: DC | PRN

## 2023-12-31 MED ORDER — LIDOCAINE HCL (PF) 1 % IJ SOLN
INTRAMUSCULAR | Status: DC | PRN
  Administered 2023-12-31: 15 mL

## 2023-12-31 MED ORDER — HEPARIN SODIUM (PORCINE) 1000 UNIT/ML IJ SOLN
INTRAMUSCULAR | Status: AC
  Filled 2023-12-31: qty 10

## 2023-12-31 MED ORDER — ONDANSETRON HCL 4 MG/2ML IJ SOLN
4.0000 mg | Freq: Four times a day (QID) | INTRAMUSCULAR | Status: DC | PRN
Start: 2023-12-31 — End: 2023-12-31

## 2023-12-31 MED ORDER — SODIUM CHLORIDE 0.9 % IV SOLN: INTRAVENOUS | Status: DC

## 2023-12-31 MED ORDER — MIDAZOLAM HCL 2 MG/2ML IJ SOLN
INTRAMUSCULAR | Status: AC
Start: 2023-12-31 — End: ?
  Filled 2023-12-31: qty 2

## 2023-12-31 MED ORDER — LABETALOL HCL 5 MG/ML IV SOLN
10.0000 mg | INTRAVENOUS | Status: DC | PRN
  Administered 2023-12-31: 10 mg via INTRAVENOUS
  Filled 2023-12-31: qty 4

## 2023-12-31 MED ORDER — SODIUM CHLORIDE 0.9% FLUSH: 3.0000 mL | INTRAVENOUS | Status: DC | PRN

## 2023-12-31 MED ORDER — FENTANYL CITRATE (PF) 100 MCG/2ML IJ SOLN
INTRAMUSCULAR | Status: AC
  Filled 2023-12-31: qty 2

## 2023-12-31 MED ORDER — HEPARIN (PORCINE) IN NACL 1000-0.9 UT/500ML-% IV SOLN
INTRAVENOUS | Status: DC | PRN
  Administered 2023-12-31 (×2): 500 mL

## 2023-12-31 MED ORDER — LIDOCAINE HCL (PF) 1 % IJ SOLN
INTRAMUSCULAR | Status: AC
  Filled 2023-12-31: qty 30

## 2023-12-31 MED ORDER — IODIXANOL 320 MG/ML IV SOLN
INTRAVENOUS | Status: DC | PRN
  Administered 2023-12-31: 50 mL

## 2023-12-31 MED ORDER — FENTANYL CITRATE (PF) 100 MCG/2ML IJ SOLN
INTRAMUSCULAR | Status: DC | PRN
  Administered 2023-12-31: 50 ug via INTRAVENOUS

## 2023-12-31 MED ORDER — HYDRALAZINE HCL 20 MG/ML IJ SOLN: 5.0000 mg | INTRAMUSCULAR | Status: DC | PRN

## 2023-12-31 MED ORDER — SODIUM CHLORIDE 0.9 % WEIGHT BASED INFUSION: 1.0000 mL/kg/h | INTRAVENOUS | Status: DC

## 2023-12-31 MED ORDER — SODIUM CHLORIDE 0.9% FLUSH: 3.0000 mL | Freq: Two times a day (BID) | INTRAVENOUS | Status: DC

## 2023-12-31 SURGICAL SUPPLY — 10 items
CATH OMNI FLUSH 5F 65CM (CATHETERS) IMPLANT
COVER DOME SNAP 22 D (MISCELLANEOUS) IMPLANT
DEVICE CLOSURE MYNXGRIP 5F (Vascular Products) IMPLANT
KIT MICROPUNCTURE NIT STIFF (SHEATH) IMPLANT
KIT SINGLE USE MANIFOLD (KITS) IMPLANT
SET ATX-X65L (MISCELLANEOUS) IMPLANT
SHEATH PINNACLE 5F 10CM (SHEATH) IMPLANT
SHEATH PROBE COVER 6X72 (BAG) IMPLANT
TRAY PV CATH (CUSTOM PROCEDURE TRAY) ×2 IMPLANT
WIRE BENTSON .035X145CM (WIRE) IMPLANT

## 2023-12-31 NOTE — Op Note (Addendum)
    Patient name: Tina George MRN: [id number] DOB: 10-31-1945 Sex: female  12/31/2023 Pre-operative Diagnosis: Right lower extremity critical and ischemia with rest pain Post-operative diagnosis:  Same Surgeon:  Victorino Sparrow, MD Procedure Performed: 1.  Ultrasound-guided micropuncture access of left common femoral artery in retrograde fashion 2.  Aortogram 3.  Second-order cannulation, right lower extremity angiogram 4.  Device assisted closure-Mynx 5.  Moderate sedation time 13 minutes, contrast volume 50 mL   Indications: Patient is a 78 year old female who is a patient of my partner Dr. Lenell Antu.  She presents with rest pain with previous history of right SFA stenting.  She has been noncompliant with medications, and continues to smoke.  I have discussed the risks and benefits of right lower extremity angiogram in an effort to define and improve distal perfusion for alleviation of rest pain, Deneen elected to proceed.  Findings:   Aortogram: Bilateral renal arteries patent.  No flow-limiting stenosis in the aortoiliac segments bilaterally.  Small amount of aortic ectasia.  Small iliac arteries bilaterally.  On the right: Widely patent common femoral artery, profunda.  The superficial femoral artery and previously placed stents are occluded.  The occlusion continues into the P1 segment of the popliteal artery.  The popliteal artery then reconstitutes at P2 with no flow-limiting stenosis appreciated in the below-knee popliteal artery. Runoff: Anterior tibial, tibioperoneal trunk, peroneal, posterior tibial arteries are all patent proximally.  The anterior tibial and peroneal artery continued to the level of the ankle with the dorsalis pedis continuing into the foot.  Arteries are small, and are underfilled due to the superficial femoral artery occlusion.   Procedure:  The patient was identified in the holding area and taken to room 8.  The patient was then placed supine on the table and  prepped and draped in the usual sterile fashion.  A time out was called.  Ultrasound was used to evaluate the left common femoral artery.  It was patent .  A digital ultrasound image was acquired.  A micropuncture needle was used to access the left common femoral artery under ultrasound guidance.  An 018 wire was advanced without resistance and a micropuncture sheath was placed.  The 018 wire was removed and a benson wire was placed.  The micropuncture sheath was exchanged for a 5 french sheath.  An omniflush catheter was advanced over the wire to the level of L-1.  An abdominal angiogram was obtained.  Next, using the omniflush catheter and a benson wire, the aortic bifurcation was crossed and the catheter was placed into theright external iliac artery and right runoff was obtained.   See results above.  Impression: Patient with occluded right sided superficial femoral artery stents.  She would benefit from femoral to below-knee popliteal artery bypass.  Will vein map.  Have discussed with Dr. Lenell Antu.  Patient was offered surgery by Dr. Lenell Antu tomorrow, however declined asking for a surgery date next week.    Victorino Sparrow MD [facility] Office: [phone]

## 2023-12-31 NOTE — Discharge Instructions (Signed)
 Femoral Site Care This sheet gives you information about how to care for yourself after your procedure. Your health care provider may also give you more specific instructions. If you have problems or questions, contact your health care provider. What can I expect after the procedure?  After the procedure, it is common to have: Bruising that usually fades within 1-2 weeks. Tenderness at the site. Follow these instructions at home: Wound care Follow instructions from your health care provider about how to take care of your insertion site. Make sure you: Wash your hands with soap and water before you change your bandage (dressing). If soap and water are not available, use hand sanitizer. Remove your dressing as told by your health care provider. In 24 hours Do not take baths, swim, or use a hot tub until your health care provider approves. You may shower 24-48 hours after the procedure or as told by your health care provider. Gently wash the site with plain soap and water. Pat the area dry with a clean towel. Do not rub the site. This may cause bleeding. Do not apply powder or lotion to the site. Keep the site clean and dry. Check your femoral site every day for signs of infection. Check for: Redness, swelling, or pain. Fluid or blood. Warmth. Pus or a bad smell. Activity For the first 2-3 days after your procedure, or as long as directed: Avoid climbing stairs as much as possible. Do not squat. Do not lift anything that is heavier than 10 lb (4.5 kg), or the limit that you are told, until your health care provider says that it is safe. For 5 days Rest as directed. Avoid sitting for a long time without moving. Get up to take short walks every 1-2 hours. Do not drive for 24 hours if you were given a medicine to help you relax (sedative). General instructions Take over-the-counter and prescription medicines only as told by your health care provider. Keep all follow-up visits as told by  your health care provider. This is important. Contact a health care provider if you have: A fever or chills. You have redness, swelling, or pain around your insertion site. Get help right away if: The catheter insertion area swells very fast. You pass out. You suddenly start to sweat or your skin gets clammy. The catheter insertion area is bleeding, and the bleeding does not stop when you hold steady pressure on the area. The area near or just beyond the catheter insertion site becomes pale, cool, tingly, or numb. These symptoms may represent a serious problem that is an emergency. Do not wait to see if the symptoms will go away. Get medical help right away. Call your local emergency services (911 in the [place] Do not drive yourself to the hospital. Summary After the procedure, it is common to have bruising that usually fades within 1-2 weeks. Check your femoral site every day for signs of infection. Do not lift anything that is heavier than 10 lb (4.5 kg), or the limit that you are told, until your health care provider says that it is safe. This information is not intended to replace advice given to you by your health care provider. Make sure you discuss any questions you have with your health care provider. Document Revised: 10/13/2017 Document Reviewed: 10/13/2017 Elsevier Patient Education  2020 [employer].

## 2024-01-01 ENCOUNTER — Encounter (HOSPITAL_COMMUNITY): Payer: Self-pay | Admitting: Vascular Surgery
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
## 2024-01-07 ENCOUNTER — Other Ambulatory Visit: Payer: Self-pay

## 2024-01-07 ENCOUNTER — Encounter: Payer: Self-pay | Admitting: Internal Medicine

## 2024-01-07 ENCOUNTER — Telehealth: Payer: Self-pay

## 2024-01-07 ENCOUNTER — Encounter (HOSPITAL_COMMUNITY): Payer: Self-pay | Admitting: Vascular Surgery

## 2024-01-07 DIAGNOSIS — Y848 Other medical procedures as the cause of abnormal reaction of the patient, or of later complication, without mention of misadventure at the time of the procedure: Secondary | ICD-10-CM

## 2024-01-07 DIAGNOSIS — I998 Other disorder of circulatory system: Secondary | ICD-10-CM

## 2024-01-07 DIAGNOSIS — I739 Peripheral vascular disease, unspecified: Secondary | ICD-10-CM

## 2024-01-07 NOTE — Progress Notes (Signed)
 PCP - Simone Curia, MD  Cardiologist - Marlyn Corporal Madireddy, MD  Electrophysiology - Cardiology Lanier Prude, MD   PPM/ICD - [facility] [employer] dual-chamber [facility] implant  Device Orders - requested Rep Notified -   Chest x-ray - 09-30-23 PFT-12-29-20 EKG - 11-27-23 Stress Test - 11-15-20 ECHO - 08-27-23 Cardiac Cath -   CPAP - denies  DM -denies  Blood Thinner Instructions: clopidogrel (PLAVIX) per patient she has not taken medication in a couple of weeks.(Per patient she had the flu at that time and had N/V and has not restarted medication Aspirin Instructions: n/a  ERAS Protcol - Clear liquids until 7:00  COVID TEST- n/a  Anesthesia review: yes  Patient verbally denies any shortness of breath, fever, cough and chest pain during phone call   -------------  SDW INSTRUCTIONS given:  Your procedure is scheduled on January 08, 2024.  Report to [facility] Main Entrance "A" at 7:30 A.M., and check in at the Admitting office.  Call this number if you have problems the morning of surgery:  [phone]   Remember:  Do not eat after midnight the night before your surgery  You may drink clear liquids until 7:00 the morning of your surgery.   Clear liquids allowed are: Water, Non-Citrus Juices (without pulp), Carbonated Beverages, Clear Tea, Black Coffee Only, and Gatorade    Take these medicines the morning of surgery with A SIP OF WATER  albuterol (PROVENTIL HFA;VENTOLIN HFA) MCG/ACT inhaler  ALPRAZolam (XANAX)  Budeson-Glycopyrrol-Formoterol (BREZTRI AEROSPHERE)  (DUONEB)  metoprolol tartrate (LOPRESSOR)  nitroGLYCERIN (NITROSTAT)  oxyCODONE-acetaminophen (PERCOCET/ROXICET)  PARoxetine (PAXIL)  rosuvastatin (CRESTOR)  traMADol (ULTRAM)   As of today, STOP taking any Aspirin (unless otherwise instructed by your surgeon) Aleve, Naproxen, Ibuprofen, Motrin, Advil, Goody's, BC's, all herbal medications, fish oil, and all vitamins.                      Do not wear jewelry,  make up, or nail polish            Do not wear lotions, powders, perfumes/colognes, or deodorant.            Do not shave 48 hours prior to surgery.  Men may shave face and neck.            Do not bring valuables to the hospital.            [facility] is not responsible for any belongings or valuables.  Do NOT Smoke (Tobacco/Vaping) 24 hours prior to your procedure If you use a CPAP at night, you may bring all equipment for your overnight stay.   Contacts, glasses, dentures or bridgework may not be worn into surgery.      For patients admitted to the hospital, discharge time will be determined by your treatment team.   Patients discharged the day of surgery will not be allowed to drive home, and someone needs to stay with them for 24 hours.    Special instructions:   [place]- Preparing For Surgery  Before surgery, you can play an important role. Because skin is not sterile, your skin needs to be as free of germs as possible. You can reduce the number of germs on your skin by washing with CHG (chlorahexidine gluconate) Soap before surgery.  CHG is an antiseptic cleaner which kills germs and bonds with the skin to continue killing germs even after washing.    Oral Hygiene is also important to reduce your risk of infection.  Remember - BRUSH YOUR TEETH THE MORNING OF SURGERY WITH YOUR REGULAR TOOTHPASTE  Please do not use if you have an allergy to CHG or antibacterial soaps. If your skin becomes reddened/irritated stop using the CHG.  Do not shave (including legs and underarms) for at least 48 hours prior to first CHG shower. It is OK to shave your face.  Please follow these instructions carefully.   Shower the NIGHT BEFORE SURGERY and the MORNING OF SURGERY with DIAL Soap.   Pat yourself dry with a CLEAN TOWEL.  Wear CLEAN PAJAMAS to bed the night before surgery  Place CLEAN SHEETS on your bed the night of your first shower and DO NOT SLEEP WITH PETS.   Day of  Surgery: Please shower morning of surgery  Wear Clean/Comfortable clothing the morning of surgery Do not apply any deodorants/lotions.   Remember to brush your teeth WITH YOUR REGULAR TOOTHPASTE.   Questions were answered. Patient verbalized understanding of instructions.

## 2024-01-07 NOTE — Telephone Encounter (Signed)
 Pre-Op [facility] reached out to inform that patient admitted to not taking her Plavix for weeks due to "flu."  This nurse confirmed with Dr. Lenell Antu that he will still perform surgery tomorrow with encouragement to adhere to medication regimen.

## 2024-01-07 NOTE — Anesthesia Preprocedure Evaluation (Addendum)
 Anesthesia Evaluation  Patient identified by MRN, date of birth, ID band Patient awake    Reviewed: Allergy & Precautions, NPO status , Patient's Chart, lab work & pertinent test results  History of Anesthesia Complications Negative for: history of anesthetic complications  Airway Mallampati: II  TM Distance: >3 FB Neck ROM: Full    Dental  (+) Upper Dentures, Lower Dentures, Dental Advisory Given   Pulmonary asthma , neg sleep apnea, COPD (used inhaler this morning),  COPD inhaler, Recent URI  (flu 4 weeks ago), Residual Cough, Current Smoker and Patient abstained from smoking.   Pulmonary exam normal breath sounds clear to auscultation       Cardiovascular hypertension (metoprolol), Pt. on home beta blockers (-) angina + CAD (moderate by cardiac CT) and + Peripheral Vascular Disease  (-) Past MI, (-) Cardiac Stents and (-) CABG + dysrhythmias (palpitations, bradycardia) + pacemaker (for SSS) + Valvular Problems/Murmurs  Rhythm:Regular Rate:Normal  HLD  TTE 08/27/2023: IMPRESSIONS    1. Left ventricular ejection fraction, by estimation, is 60 to 65%. The  left ventricle has normal function. The left ventricle has no regional  wall motion abnormalities. There is mild concentric left ventricular  hypertrophy. Left ventricular diastolic  parameters were normal. The average left ventricular global longitudinal  strain is 18.2 %. The global longitudinal strain is normal.   2. Right ventricular systolic function is normal. The right ventricular  size is normal.   3. The mitral valve is normal in structure. No evidence of mitral valve  regurgitation. No evidence of mitral stenosis.   4. The aortic valve is tricuspid. Aortic valve regurgitation is not  visualized. No aortic stenosis is present.   5. Aortic Normal DTA.   6. The inferior vena cava is dilated in size with >50% respiratory  variability, suggesting right atrial pressure  of 8 mmHg.   Normal stress test 11/15/2020    Neuro/Psych  PSYCHIATRIC DISORDERS  Depression    negative neurological ROS     GI/Hepatic Neg liver ROS,GERD  ,,  Endo/Other  negative endocrine ROS    Renal/GU negative Renal ROS     Musculoskeletal  (+) Arthritis , Osteoarthritis,    Abdominal   Peds  Hematology  (+) Blood dyscrasia, anemia Lab Results      Component                Value               Date                      WBC                      7.3                 12/26/2023                HGB                      12.6                12/31/2023                HCT                      37.0                12/31/2023  MCV                      96.6                12/26/2023                PLT                      181                 12/26/2023              Anesthesia Other Findings Last Plavix: ~1 month ago  Reproductive/Obstetrics                             Anesthesia Physical Anesthesia Plan  ASA: 3  Anesthesia Plan: General   Post-op Pain Management: Tylenol PO (pre-op)*   Induction: Intravenous  PONV Risk Score and Plan: 2 and Ondansetron, Dexamethasone and Treatment may vary due to age or medical condition  Airway Management Planned: Oral ETT  Additional Equipment: Arterial line  Intra-op Plan:   Post-operative Plan: Extubation in OR  Informed Consent: I have reviewed the patients History and Physical, chart, labs and discussed the procedure including the risks, benefits and alternatives for the proposed anesthesia with the patient or authorized representative who has indicated his/her understanding and acceptance.     Dental advisory given  Plan Discussed with: CRNA and Anesthesiologist  Anesthesia Plan Comments: (Risks of general anesthesia discussed including, but not limited to, sore throat, hoarse voice, chipped/damaged teeth, injury to vocal cords, nausea and vomiting, allergic reactions, lung infection,  heart attack, stroke, and death. All questions answered.   PAT note by Antionette Poles, PA-C: 78 year old female follows with cardiology for history of moderate CAD by cardiac CT 05/2023 with abnormal CT FFR of very distal LAD and OM territories being managed medically, sick sinus syndrome with symptomatic bradycardia and poor heart response s/p [employer] dual-chamber PPM implant 09/29/2023, PAD s/p RLE femoropopliteal angioplasty and stenting November 2024 that subsequently thrombosed.  Echo 08/2023 showed normal biventricular function, normal valves.  Last seen by Dr. Vincent Gros on 12/25/2023, noted to be stable from cardiac standpoint that time, current medications continued.  She was however advised to follow-up with her vascular surgeon regarding worsening pain and discoloration of the right lower extremity.  Note also states, "okay to proceed with any vascular surgery from cardiac standpoint as required."  Patient had angiography by Dr. Karin Lieu on 12/31/2023 showed right SFA and previously placed stents to be occluded.  Right femoral to below-knee popliteal artery bypass was recommended.  Other pertinent history includes current smoker, CKD 3, benign essential tremor, GERD, asthma/COPD (on Breztri and as needed albuterol), chronic venous insufficiency.  I-STAT 12/31/2023 showed creatinine mildly elevated 1.30, mild hypokalemia potassium 3.4, otherwise unremarkable.  Patient will need day of surgery valuation.  TTE 08/27/2023: 1. Left ventricular ejection fraction, by estimation, is 60 to 65%. The  left ventricle has normal function. The left ventricle has no regional  wall motion abnormalities. There is mild concentric left ventricular  hypertrophy. Left ventricular diastolic  parameters were normal. The average left ventricular global longitudinal  strain is 18.2 %. The global longitudinal strain is normal.  2. Right ventricular systolic function is normal. The right ventricular  size is  normal.  3. The mitral valve is normal in structure. No evidence of mitral  valve  regurgitation. No evidence of mitral stenosis.  4. The aortic valve is tricuspid. Aortic valve regurgitation is not  visualized. No aortic stenosis is present.  5. Aortic Normal DTA.  6. The inferior vena cava is dilated in size with >50% respiratory  variability, suggesting right atrial pressure of 8 mmHg.   Event monitor 08/2023: Predominantly sinus rhythm Average heart rate 86/min [heart rate ranging from 57 bpm to 149 bpm]. Short runs of supraventricular tachycardia occurred with the longest episode lasting 12.1 seconds (occurred on 08-07-2023 at 9:21 PM). Supraventricular ectopy occurring occasionally, total burden 2.7%. Ventricular ectopy was rare, burden less than 1%. No patient triggered events.  Coronary CTA with FFR analysis 05/29/2023: 1. Left Main: findings  2. LAD: findings 0.83, 0.78  3. LCX: findings 0.96, 0.91  4. OM: findings 0.88, 0.84 0.78  5. RCA: findings 0.89, 0.84 0.84  IMPRESSION: FFR mildly abnormal but only in very distal LAD and OM likely due to tapering effect; medical therapy likely best option.   )        Anesthesia Quick Evaluation

## 2024-01-07 NOTE — Progress Notes (Signed)
 PERIOPERATIVE PRESCRIPTION FOR IMPLANTED CARDIAC DEVICE PROGRAMMING  Patient Information: Name:  Tina George  DOB:  1946-09-05  MRN:  [id number]    Planned Procedure:  Bypass Graft Femoral-Popliteal artery Right  Surgeon:  Dr. Lenell Antu  Date of Procedure:  01-08-24  Cautery will be used.  Position during surgery:  Supine   Please send documentation back to:  [facility] (Fax # [phone])   Device Information:  Clinic EP Physician:  Lewayne Bunting, MD   Device Type:  Pacemaker Manufacturer and Phone #:  [employer]: [phone] Pacemaker Dependent?:  No. Date of Last Device Check:  12/30/2023 Normal Device Function?:  Yes.    Electrophysiologist's Recommendations:  Have magnet available. Provide continuous ECG monitoring when magnet is used or reprogramming is to be performed.  Procedure should not interfere with device function.  No device programming or magnet placement needed.  Per Device Clinic Standing Orders, Lenor Coffin, RN  8:24 PM 01/07/2024

## 2024-01-07 NOTE — Progress Notes (Signed)
 Left message for Dr. Lenell Antu scheduler making them that she had not been taking her Plavix for a little while. She reported having the flu a couple of weeks ago with N/v (denies any symptoms now) so she had not restarted taking it

## 2024-01-07 NOTE — Progress Notes (Signed)
 Anesthesia Chart Review: Same day workup  78 year old female follows with cardiology for history of moderate CAD by cardiac CT 05/2023 with abnormal CT FFR of very distal LAD and OM territories being managed medically, sick sinus syndrome with symptomatic bradycardia and poor heart response s/p [employer] dual-chamber PPM implant 09/29/2023, PAD s/p RLE femoropopliteal angioplasty and stenting November 2024 that subsequently thrombosed.  Echo 08/2023 showed normal biventricular function, normal valves.  Last seen by Dr. Vincent Gros on 12/25/2023, noted to be stable from cardiac standpoint that time, current medications continued.  She was however advised to follow-up with her vascular surgeon regarding worsening pain and discoloration of the right lower extremity.  Note also states, "okay to proceed with any vascular surgery from cardiac standpoint as required."  Patient had angiography by Dr. Karin Lieu on 12/31/2023 showed right SFA and previously placed stents to be occluded.  Right femoral to below-knee popliteal artery bypass was recommended.  Other pertinent history includes current smoker, CKD 3, benign essential tremor, GERD, asthma/COPD (on Breztri and as needed albuterol), chronic venous insufficiency.  I-STAT 12/31/2023 showed creatinine mildly elevated 1.30, mild hypokalemia potassium 3.4, otherwise unremarkable.  Patient will need day of surgery valuation.  TTE 08/27/2023: 1. Left ventricular ejection fraction, by estimation, is 60 to 65%. The  left ventricle has normal function. The left ventricle has no regional  wall motion abnormalities. There is mild concentric left ventricular  hypertrophy. Left ventricular diastolic  parameters were normal. The average left ventricular global longitudinal  strain is 18.2 %. The global longitudinal strain is normal.   2. Right ventricular systolic function is normal. The right ventricular  size is normal.   3. The mitral valve is normal in  structure. No evidence of mitral valve  regurgitation. No evidence of mitral stenosis.   4. The aortic valve is tricuspid. Aortic valve regurgitation is not  visualized. No aortic stenosis is present.   5. Aortic Normal DTA.   6. The inferior vena cava is dilated in size with >50% respiratory  variability, suggesting right atrial pressure of 8 mmHg.   Event monitor 08/2023: Predominantly sinus rhythm Average heart rate 86/min [heart rate ranging from 57 bpm to 149 bpm]. Short runs of supraventricular tachycardia occurred with the longest episode lasting 12.1 seconds (occurred on 08-07-2023 at 9:21 PM). Supraventricular ectopy occurring occasionally, total burden 2.7%. Ventricular ectopy was rare, burden less than 1%. No patient triggered events.  Coronary CTA with FFR analysis 05/29/2023: 1. Left Main: findings   2. LAD: findings 0.83, 0.78   3. LCX: findings 0.96, 0.91   4. OM: findings 0.88, 0.84 0.78   5. RCA: findings 0.89, 0.84 0.84   IMPRESSION: FFR mildly abnormal but only in very distal LAD and OM likely due to tapering effect; medical therapy likely best option.    Zannie Cove [facility] Short Stay Center/Anesthesiology Phone [phone] 01/07/2024 10:42 AM

## 2024-01-08 ENCOUNTER — Encounter (HOSPITAL_COMMUNITY)
Admission: RE | Disposition: A | Discharge status: Home Health | Payer: Self-pay | Source: Home / Self Care | Attending: Vascular Surgery

## 2024-01-08 ENCOUNTER — Inpatient Hospital Stay (HOSPITAL_COMMUNITY): Payer: Self-pay | Admitting: Physician Assistant

## 2024-01-08 ENCOUNTER — Inpatient Hospital Stay (HOSPITAL_COMMUNITY)
Admission: RE | Admit: 2024-01-08 | Discharge: 2024-01-13 | DRG: 272 | Disposition: A | Discharge status: Home Health | Attending: Vascular Surgery | Admitting: Vascular Surgery

## 2024-01-08 ENCOUNTER — Other Ambulatory Visit: Payer: Self-pay

## 2024-01-08 DIAGNOSIS — Z885 Allergy status to narcotic agent status: Secondary | ICD-10-CM | POA: Diagnosis not present

## 2024-01-08 DIAGNOSIS — Z7902 Long term (current) use of antithrombotics/antiplatelets: Secondary | ICD-10-CM | POA: Diagnosis not present

## 2024-01-08 DIAGNOSIS — T82898A Other specified complication of vascular prosthetic devices, implants and grafts, initial encounter: Secondary | ICD-10-CM | POA: Diagnosis present

## 2024-01-08 DIAGNOSIS — J4489 Other specified chronic obstructive pulmonary disease: Secondary | ICD-10-CM | POA: Diagnosis present

## 2024-01-08 DIAGNOSIS — K219 Gastro-esophageal reflux disease without esophagitis: Secondary | ICD-10-CM | POA: Diagnosis present

## 2024-01-08 DIAGNOSIS — Z8249 Family history of ischemic heart disease and other diseases of the circulatory system: Secondary | ICD-10-CM

## 2024-01-08 DIAGNOSIS — I10 Essential (primary) hypertension: Secondary | ICD-10-CM | POA: Diagnosis not present

## 2024-01-08 DIAGNOSIS — I70238 Atherosclerosis of native arteries of right leg with ulceration of other part of lower right leg: Secondary | ICD-10-CM | POA: Diagnosis not present

## 2024-01-08 DIAGNOSIS — Z800 Family history of malignant neoplasm of digestive organs: Secondary | ICD-10-CM | POA: Diagnosis not present

## 2024-01-08 DIAGNOSIS — Z90710 Acquired absence of both cervix and uterus: Secondary | ICD-10-CM

## 2024-01-08 DIAGNOSIS — I998 Other disorder of circulatory system: Secondary | ICD-10-CM

## 2024-01-08 DIAGNOSIS — I2510 Atherosclerotic heart disease of native coronary artery without angina pectoris: Secondary | ICD-10-CM | POA: Diagnosis not present

## 2024-01-08 DIAGNOSIS — F17210 Nicotine dependence, cigarettes, uncomplicated: Secondary | ICD-10-CM

## 2024-01-08 DIAGNOSIS — R791 Abnormal coagulation profile: Secondary | ICD-10-CM | POA: Diagnosis present

## 2024-01-08 DIAGNOSIS — I739 Peripheral vascular disease, unspecified: Secondary | ICD-10-CM

## 2024-01-08 DIAGNOSIS — E876 Hypokalemia: Secondary | ICD-10-CM | POA: Diagnosis present

## 2024-01-08 DIAGNOSIS — Z886 Allergy status to analgesic agent status: Secondary | ICD-10-CM | POA: Diagnosis not present

## 2024-01-08 DIAGNOSIS — E7800 Pure hypercholesterolemia, unspecified: Secondary | ICD-10-CM | POA: Diagnosis present

## 2024-01-08 DIAGNOSIS — I70221 Atherosclerosis of native arteries of extremities with rest pain, right leg: Secondary | ICD-10-CM | POA: Diagnosis present

## 2024-01-08 DIAGNOSIS — L97519 Non-pressure chronic ulcer of other part of right foot with unspecified severity: Secondary | ICD-10-CM | POA: Diagnosis present

## 2024-01-08 DIAGNOSIS — I70229 Atherosclerosis of native arteries of extremities with rest pain, unspecified extremity: Secondary | ICD-10-CM | POA: Diagnosis present

## 2024-01-08 DIAGNOSIS — I70235 Atherosclerosis of native arteries of right leg with ulceration of other part of foot: Secondary | ICD-10-CM | POA: Diagnosis present

## 2024-01-08 DIAGNOSIS — Z79899 Other long term (current) drug therapy: Secondary | ICD-10-CM

## 2024-01-08 DIAGNOSIS — N183 Chronic kidney disease, stage 3 unspecified: Secondary | ICD-10-CM | POA: Diagnosis present

## 2024-01-08 DIAGNOSIS — Z881 Allergy status to other antibiotic agents status: Secondary | ICD-10-CM | POA: Diagnosis not present

## 2024-01-08 DIAGNOSIS — R001 Bradycardia, unspecified: Secondary | ICD-10-CM | POA: Diagnosis present

## 2024-01-08 DIAGNOSIS — Y848 Other medical procedures as the cause of abnormal reaction of the patient, or of later complication, without mention of misadventure at the time of the procedure: Secondary | ICD-10-CM

## 2024-01-08 HISTORY — DX: Presence of cardiac pacemaker: Z95.0

## 2024-01-08 HISTORY — PX: FEMORAL-POPLITEAL BYPASS GRAFT: SHX937

## 2024-01-08 HISTORY — PX: ENDARTERECTOMY FEMORAL: SHX5804

## 2024-01-08 HISTORY — DX: Peripheral vascular disease, unspecified: I73.9

## 2024-01-08 HISTORY — DX: Atherosclerosis of native arteries of extremities with rest pain, unspecified extremity: I70.229

## 2024-01-08 LAB — APTT: aPTT: 26 s (ref 24–36)

## 2024-01-08 LAB — COMPREHENSIVE METABOLIC PANEL WITH GFR
ALT: 15 U/L (ref 0–44)
AST: 21 U/L (ref 15–41)
Albumin: 3.6 g/dL (ref 3.5–5.0)
Alkaline Phosphatase: 58 U/L (ref 38–126)
Anion gap: 11 (ref 5–15)
BUN: 17 mg/dL (ref 8–23)
CO2: 27 mmol/L (ref 22–32)
Calcium: 9.2 mg/dL (ref 8.9–10.3)
Chloride: 103 mmol/L (ref 98–111)
Creatinine, Ser: 1.3 mg/dL — ABNORMAL HIGH (ref 0.44–1.00)
GFR, Estimated: 42 mL/min — ABNORMAL LOW (ref >60)
Glucose, Bld: 94 mg/dL (ref 70–99)
Potassium: 3.4 mmol/L — ABNORMAL LOW (ref 3.5–5.1)
Sodium: 141 mmol/L (ref 135–145)
Total Bilirubin: 0.8 mg/dL (ref 0.0–1.2)
Total Protein: 6.6 g/dL (ref 6.5–8.1)

## 2024-01-08 LAB — TYPE AND SCREEN
ABO/RH(D): A POS
Antibody Screen: NEGATIVE

## 2024-01-08 LAB — URINALYSIS, ROUTINE W REFLEX MICROSCOPIC
Bilirubin Urine: NEGATIVE
Glucose, UA: NEGATIVE mg/dL
Ketones, ur: NEGATIVE mg/dL
Leukocytes,Ua: NEGATIVE
Nitrite: NEGATIVE
Protein, ur: 100 mg/dL — AB
Specific Gravity, Urine: 1.017 (ref 1.005–1.030)
pH: 5 (ref 5.0–8.0)

## 2024-01-08 LAB — ABO/RH: ABO/RH(D): A POS

## 2024-01-08 LAB — CBC
HCT: 41.7 % (ref 36.0–46.0)
Hemoglobin: 13.2 g/dL (ref 12.0–15.0)
MCH: 30.8 pg (ref 26.0–34.0)
MCHC: 31.7 g/dL (ref 30.0–36.0)
MCV: 97.4 fL (ref 80.0–100.0)
Platelets: 169 10*3/uL (ref 150–400)
RBC: 4.28 MIL/uL (ref 3.87–5.11)
RDW: 15 % (ref 11.5–15.5)
WBC: 8.4 10*3/uL (ref 4.0–10.5)
nRBC: 0 % (ref 0.0–0.2)

## 2024-01-08 LAB — PROTIME-INR
INR: 1 (ref 0.8–1.2)
Prothrombin Time: 13.3 s (ref 11.4–15.2)

## 2024-01-08 LAB — SURGICAL PCR SCREEN
MRSA, PCR: NEGATIVE
Staphylococcus aureus: NEGATIVE

## 2024-01-08 SURGERY — BYPASS GRAFT FEMORAL-POPLITEAL ARTERY
Anesthesia: General | Site: Leg Lower | Laterality: Right

## 2024-01-08 MED ORDER — LABETALOL HCL 5 MG/ML IV SOLN: 10.0000 mg | INTRAVENOUS | Status: DC | PRN

## 2024-01-08 MED ORDER — ALBUTEROL SULFATE HFA 108 (90 BASE) MCG/ACT IN AERS
INHALATION_SPRAY | RESPIRATORY_TRACT | Status: AC
  Filled 2024-01-08: qty 6.7

## 2024-01-08 MED ORDER — DOCUSATE SODIUM 100 MG PO CAPS
100.0000 mg | ORAL_CAPSULE | Freq: Every day | ORAL | Status: DC
  Administered 2024-01-09 – 2024-01-13 (×4): 100 mg via ORAL
  Filled 2024-01-08 (×5): qty 1

## 2024-01-08 MED ORDER — SODIUM CHLORIDE 0.9 % IV SOLN: 500.0000 mL | Freq: Once | INTRAVENOUS | Status: DC | PRN

## 2024-01-08 MED ORDER — MIDAZOLAM HCL 2 MG/2ML IJ SOLN
INTRAMUSCULAR | Status: AC
  Filled 2024-01-08: qty 2

## 2024-01-08 MED ORDER — ROSUVASTATIN CALCIUM 20 MG PO TABS
40.0000 mg | ORAL_TABLET | Freq: Every day | ORAL | Status: DC
  Administered 2024-01-08: 40 mg via ORAL
  Filled 2024-01-08 (×2): qty 2

## 2024-01-08 MED ORDER — CEFAZOLIN SODIUM-DEXTROSE 2-4 GM/100ML-% IV SOLN
2.0000 g | INTRAVENOUS | Status: AC
Start: 2024-01-08 — End: 2024-01-08
  Administered 2024-01-08: 2 g via INTRAVENOUS
  Filled 2024-01-08 (×2): qty 100

## 2024-01-08 MED ORDER — NICOTINE 14 MG/24HR TD PT24
14.0000 mg | MEDICATED_PATCH | Freq: Every day | TRANSDERMAL | Status: DC
  Administered 2024-01-08 – 2024-01-13 (×6): 14 mg via TRANSDERMAL
  Filled 2024-01-08 (×7): qty 1

## 2024-01-08 MED ORDER — PHENYLEPHRINE HCL (PRESSORS) 10 MG/ML IV SOLN
INTRAVENOUS | Status: DC | PRN
  Administered 2024-01-08 (×2): 160 ug via INTRAVENOUS
  Administered 2024-01-08: 80 ug via INTRAVENOUS

## 2024-01-08 MED ORDER — AMISULPRIDE (ANTIEMETIC) 5 MG/2ML IV SOLN: 10.0000 mg | Freq: Once | INTRAVENOUS | Status: DC | PRN

## 2024-01-08 MED ORDER — FENTANYL CITRATE (PF) 250 MCG/5ML IJ SOLN
INTRAMUSCULAR | Status: AC
  Filled 2024-01-08: qty 5

## 2024-01-08 MED ORDER — NITROGLYCERIN 0.4 MG SL SUBL: 0.4000 mg | SUBLINGUAL_TABLET | SUBLINGUAL | Status: DC | PRN

## 2024-01-08 MED ORDER — MORPHINE SULFATE (PF) 2 MG/ML IV SOLN
2.0000 mg | INTRAVENOUS | Status: DC | PRN
  Administered 2024-01-08: 2 mg via INTRAVENOUS
  Filled 2024-01-08: qty 1

## 2024-01-08 MED ORDER — OXYCODONE HCL 5 MG/5ML PO SOLN: 5.0000 mg | Freq: Once | ORAL | Status: DC | PRN

## 2024-01-08 MED ORDER — PHENOL 1.4 % MT LIQD: 1.0000 | OROMUCOSAL | Status: DC | PRN

## 2024-01-08 MED ORDER — ALPRAZOLAM 0.25 MG PO TABS: 0.2500 mg | ORAL_TABLET | Freq: Four times a day (QID) | ORAL | Status: DC | PRN

## 2024-01-08 MED ORDER — HEPARIN SODIUM (PORCINE) 1000 UNIT/ML IJ SOLN
INTRAMUSCULAR | Status: AC
  Filled 2024-01-08: qty 2

## 2024-01-08 MED ORDER — ONDANSETRON HCL 4 MG/2ML IJ SOLN
INTRAMUSCULAR | Status: DC | PRN
  Administered 2024-01-08: 4 mg via INTRAVENOUS

## 2024-01-08 MED ORDER — PHENYLEPHRINE HCL-NACL 20-0.9 MG/250ML-% IV SOLN
INTRAVENOUS | Status: DC | PRN
  Administered 2024-01-08: 25 ug/min via INTRAVENOUS

## 2024-01-08 MED ORDER — LIDOCAINE 2% (20 MG/ML) 5 ML SYRINGE
INTRAMUSCULAR | Status: DC | PRN
  Administered 2024-01-08: 80 mg via INTRAVENOUS

## 2024-01-08 MED ORDER — CLEVIDIPINE BUTYRATE 0.5 MG/ML IV EMUL
INTRAVENOUS | Status: AC
  Filled 2024-01-08: qty 50

## 2024-01-08 MED ORDER — HEPARIN 6000 UNIT IRRIGATION SOLUTION
Status: DC | PRN
  Administered 2024-01-08: 1

## 2024-01-08 MED ORDER — PANTOPRAZOLE SODIUM 40 MG PO TBEC
40.0000 mg | DELAYED_RELEASE_TABLET | Freq: Every day | ORAL | Status: DC
  Administered 2024-01-08 – 2024-01-13 (×6): 40 mg via ORAL
  Filled 2024-01-08 (×6): qty 1

## 2024-01-08 MED ORDER — HEPARIN SODIUM (PORCINE) 1000 UNIT/ML IJ SOLN
INTRAMUSCULAR | Status: DC | PRN
  Administered 2024-01-08: 1000 [IU] via INTRAVENOUS
  Administered 2024-01-08: 3000 [IU] via INTRAVENOUS
  Administered 2024-01-08: 7000 [IU] via INTRAVENOUS

## 2024-01-08 MED ORDER — GABAPENTIN 300 MG PO CAPS
300.0000 mg | ORAL_CAPSULE | Freq: Every day | ORAL | Status: DC
  Administered 2024-01-09 – 2024-01-12 (×5): 300 mg via ORAL
  Filled 2024-01-08 (×5): qty 1

## 2024-01-08 MED ORDER — HEPARIN 6000 UNIT IRRIGATION SOLUTION
Status: AC
  Filled 2024-01-08: qty 500

## 2024-01-08 MED ORDER — ACETAMINOPHEN 325 MG PO TABS
325.0000 mg | ORAL_TABLET | ORAL | Status: DC | PRN
  Administered 2024-01-13: 650 mg via ORAL
  Filled 2024-01-08: qty 2

## 2024-01-08 MED ORDER — LACTATED RINGERS IV SOLN: INTRAVENOUS | Status: DC

## 2024-01-08 MED ORDER — PROPOFOL 10 MG/ML IV BOLUS
INTRAVENOUS | Status: AC
  Filled 2024-01-08: qty 20

## 2024-01-08 MED ORDER — ONDANSETRON HCL 4 MG/2ML IJ SOLN: 4.0000 mg | Freq: Four times a day (QID) | INTRAMUSCULAR | Status: DC | PRN

## 2024-01-08 MED ORDER — CHLORHEXIDINE GLUCONATE CLOTH 2 % EX PADS: 6.0000 | MEDICATED_PAD | Freq: Once | CUTANEOUS | Status: DC

## 2024-01-08 MED ORDER — SODIUM CHLORIDE 0.9 % IV SOLN: INTRAVENOUS | Status: AC

## 2024-01-08 MED ORDER — ROCURONIUM BROMIDE 10 MG/ML (PF) SYRINGE
PREFILLED_SYRINGE | INTRAVENOUS | Status: DC | PRN
  Administered 2024-01-08: 50 mg via INTRAVENOUS

## 2024-01-08 MED ORDER — FENTANYL CITRATE (PF) 250 MCG/5ML IJ SOLN
INTRAMUSCULAR | Status: DC | PRN
Start: 2024-01-08 — End: 2024-01-08
  Administered 2024-01-08 (×3): 50 ug via INTRAVENOUS

## 2024-01-08 MED ORDER — LABETALOL HCL 5 MG/ML IV SOLN
INTRAVENOUS | Status: DC | PRN
  Administered 2024-01-08: 10 mg via INTRAVENOUS

## 2024-01-08 MED ORDER — CHLORHEXIDINE GLUCONATE CLOTH 2 % EX PADS
6.0000 | MEDICATED_PAD | Freq: Once | CUTANEOUS | Status: AC
  Administered 2024-01-08: 6 via TOPICAL

## 2024-01-08 MED ORDER — ACETAMINOPHEN 500 MG PO TABS
1000.0000 mg | ORAL_TABLET | Freq: Once | ORAL | Status: AC
  Administered 2024-01-08: 1000 mg via ORAL
  Filled 2024-01-08: qty 2

## 2024-01-08 MED ORDER — SODIUM CHLORIDE 0.9 % IV SOLN
INTRAVENOUS | Status: DC
Start: 2024-01-08 — End: 2024-01-08

## 2024-01-08 MED ORDER — PROPOFOL 10 MG/ML IV BOLUS
INTRAVENOUS | Status: DC | PRN
  Administered 2024-01-08: 120 mg via INTRAVENOUS
  Administered 2024-01-08: 50 ug/kg/min via INTRAVENOUS

## 2024-01-08 MED ORDER — FENTANYL CITRATE (PF) 100 MCG/2ML IJ SOLN
25.0000 ug | INTRAMUSCULAR | Status: DC | PRN
  Administered 2024-01-08: 25 ug via INTRAVENOUS

## 2024-01-08 MED ORDER — HEMOSTATIC AGENTS (NO CHARGE) OPTIME
TOPICAL | Status: DC | PRN
  Administered 2024-01-08 (×2): 1 via TOPICAL

## 2024-01-08 MED ORDER — PROTAMINE SULFATE 10 MG/ML IV SOLN
INTRAVENOUS | Status: DC | PRN
  Administered 2024-01-08: 50 mg via INTRAVENOUS

## 2024-01-08 MED ORDER — CEFAZOLIN SODIUM-DEXTROSE 2-4 GM/100ML-% IV SOLN
2.0000 g | Freq: Three times a day (TID) | INTRAVENOUS | Status: AC
  Administered 2024-01-08 – 2024-01-09 (×2): 2 g via INTRAVENOUS
  Filled 2024-01-08 (×2): qty 100

## 2024-01-08 MED ORDER — ORAL CARE MOUTH RINSE: 15.0000 mL | Freq: Once | OROMUCOSAL | Status: AC

## 2024-01-08 MED ORDER — OXYCODONE HCL 5 MG PO TABS: 5.0000 mg | ORAL_TABLET | Freq: Once | ORAL | Status: DC | PRN

## 2024-01-08 MED ORDER — OXYCODONE-ACETAMINOPHEN 5-325 MG PO TABS
1.0000 | ORAL_TABLET | ORAL | Status: DC | PRN
  Administered 2024-01-08: 1 via ORAL
  Administered 2024-01-09 – 2024-01-10 (×4): 2 via ORAL
  Administered 2024-01-12 (×2): 1 via ORAL
  Filled 2024-01-08 (×3): qty 2
  Filled 2024-01-08: qty 1
  Filled 2024-01-08: qty 2
  Filled 2024-01-08 (×2): qty 1

## 2024-01-08 MED ORDER — FENTANYL CITRATE (PF) 100 MCG/2ML IJ SOLN
INTRAMUSCULAR | Status: AC
  Filled 2024-01-08: qty 2

## 2024-01-08 MED ORDER — PAROXETINE HCL 20 MG PO TABS
40.0000 mg | ORAL_TABLET | Freq: Every morning | ORAL | Status: DC
  Administered 2024-01-09 – 2024-01-13 (×5): 40 mg via ORAL
  Filled 2024-01-08 (×6): qty 2

## 2024-01-08 MED ORDER — POTASSIUM CHLORIDE CRYS ER 20 MEQ PO TBCR
20.0000 meq | EXTENDED_RELEASE_TABLET | Freq: Every day | ORAL | Status: AC | PRN
  Administered 2024-01-09: 40 meq via ORAL
  Filled 2024-01-08: qty 2

## 2024-01-08 MED ORDER — METOPROLOL TARTRATE 50 MG PO TABS
ORAL_TABLET | ORAL | Status: AC
  Filled 2024-01-08: qty 1

## 2024-01-08 MED ORDER — 0.9 % SODIUM CHLORIDE (POUR BTL) OPTIME
TOPICAL | Status: DC | PRN
  Administered 2024-01-08: 1000 mL

## 2024-01-08 MED ORDER — DEXAMETHASONE SODIUM PHOSPHATE 10 MG/ML IJ SOLN
INTRAMUSCULAR | Status: DC | PRN
  Administered 2024-01-08: 10 mg via INTRAVENOUS

## 2024-01-08 MED ORDER — METOPROLOL TARTRATE 5 MG/5ML IV SOLN: 2.0000 mg | INTRAVENOUS | Status: DC | PRN

## 2024-01-08 MED ORDER — GUAIFENESIN-DM 100-10 MG/5ML PO SYRP: 15.0000 mL | ORAL_SOLUTION | ORAL | Status: DC | PRN

## 2024-01-08 MED ORDER — METOPROLOL TARTRATE 50 MG PO TABS
50.0000 mg | ORAL_TABLET | Freq: Once | ORAL | Status: AC
  Administered 2024-01-08: 50 mg via ORAL

## 2024-01-08 MED ORDER — HYDRALAZINE HCL 20 MG/ML IJ SOLN: 5.0000 mg | INTRAMUSCULAR | Status: DC | PRN

## 2024-01-08 MED ORDER — ACETAMINOPHEN 650 MG RE SUPP: 325.0000 mg | RECTAL | Status: DC | PRN

## 2024-01-08 MED ORDER — MIDAZOLAM HCL 2 MG/2ML IJ SOLN
INTRAMUSCULAR | Status: DC | PRN
Start: 2024-01-08 — End: 2024-01-08
  Administered 2024-01-08: 2 mg via INTRAVENOUS

## 2024-01-08 MED ORDER — SUGAMMADEX SODIUM 200 MG/2ML IV SOLN
INTRAVENOUS | Status: DC | PRN
  Administered 2024-01-08: 123.4 mg via INTRAVENOUS

## 2024-01-08 MED ORDER — MAGNESIUM SULFATE 2 GM/50ML IV SOLN: 2.0000 g | Freq: Every day | INTRAVENOUS | Status: DC | PRN

## 2024-01-08 MED ORDER — CHLORHEXIDINE GLUCONATE 0.12 % MT SOLN
15.0000 mL | Freq: Once | OROMUCOSAL | Status: AC
  Administered 2024-01-08: 15 mL via OROMUCOSAL
  Filled 2024-01-08: qty 15

## 2024-01-08 MED ORDER — METOPROLOL TARTRATE 50 MG PO TABS
50.0000 mg | ORAL_TABLET | Freq: Two times a day (BID) | ORAL | Status: DC
  Administered 2024-01-09 – 2024-01-10 (×5): 50 mg via ORAL
  Filled 2024-01-08 (×6): qty 1

## 2024-01-08 MED ORDER — ALUM & MAG HYDROXIDE-SIMETH 200-200-20 MG/5ML PO SUSP: 15.0000 mL | ORAL | Status: DC | PRN

## 2024-01-08 SURGICAL SUPPLY — 52 items
BAG COUNTER SPONGE SURGICOUNT (BAG) ×2 IMPLANT
BANDAGE ESMARK 6X9 LF (GAUZE/BANDAGES/DRESSINGS) IMPLANT
BENZOIN TINCTURE PRP APPL 2/3 (GAUZE/BANDAGES/DRESSINGS) IMPLANT
BNDG ESMARK 6X9 LF (GAUZE/BANDAGES/DRESSINGS) IMPLANT
CANISTER SUCT 3000ML PPV (MISCELLANEOUS) ×2 IMPLANT
CANNULA VESSEL 3MM 2 BLNT TIP (CANNULA) ×4 IMPLANT
CHLORAPREP W/TINT 26 (MISCELLANEOUS) ×4 IMPLANT
CLIP LIGATING EXTRA MED SLVR (CLIP) IMPLANT
CLIP LIGATING EXTRA SM BLUE (MISCELLANEOUS) IMPLANT
CLSR STERI-STRIP ANTIMIC 1/2X4 (GAUZE/BANDAGES/DRESSINGS) IMPLANT
DRAPE HALF SHEET 40X57 (DRAPES) IMPLANT
DRAPE X-RAY CASS 24X20 (DRAPES) IMPLANT
DRSG TEGADERM 4X4.75 (GAUZE/BANDAGES/DRESSINGS) IMPLANT
ELECT REM PT RETURN 9FT ADLT (ELECTROSURGICAL) ×2 IMPLANT
ELECTRODE REM PT RTRN 9FT ADLT (ELECTROSURGICAL) ×2 IMPLANT
GAUZE SPONGE 4X4 12PLY STRL (GAUZE/BANDAGES/DRESSINGS) ×2 IMPLANT
GLOVE BIO SURGEON STRL SZ8 (GLOVE) ×2 IMPLANT
GOWN STRL REUS W/ TWL LRG LVL3 (GOWN DISPOSABLE) ×4 IMPLANT
GOWN STRL REUS W/ TWL XL LVL3 (GOWN DISPOSABLE) ×2 IMPLANT
GRAFT PROPATEN W/RING 6X80X60 (Vascular Products) IMPLANT
HEAD CUTTING VALVULOTOME LEMTR (VASCULAR PRODUCTS) IMPLANT
HEMOSTAT SNOW SURGICEL 2X4 (HEMOSTASIS) IMPLANT
INSERT FOGARTY SM (MISCELLANEOUS) IMPLANT
KIT BASIN OR (CUSTOM PROCEDURE TRAY) ×2 IMPLANT
KIT TURNOVER KIT B (KITS) ×2 IMPLANT
LOOP VESSEL MINI RED (MISCELLANEOUS) IMPLANT
MARKER GRAFT CORONARY BYPASS (MISCELLANEOUS) IMPLANT
NS IRRIG 1000ML POUR BTL (IV SOLUTION) ×4 IMPLANT
PACK PERIPHERAL VASCULAR (CUSTOM PROCEDURE TRAY) ×2 IMPLANT
PAD ARMBOARD POSITIONER FOAM (MISCELLANEOUS) ×4 IMPLANT
PATCH VASC XENOSURE 1X6 (Vascular Products) IMPLANT
PENCIL SMOKE EVACUATOR (MISCELLANEOUS) IMPLANT
POWDER SURGICEL 3.0 GRAM (HEMOSTASIS) IMPLANT
SET COLLECT BLD 21X3/4 12 (NEEDLE) IMPLANT
SET WALTER ACTIVATION W/DRAPE (SET/KITS/TRAYS/PACK) IMPLANT
SPONGE T-LAP 18X36 ~~LOC~~+RFID STR (SPONGE) IMPLANT
STOPCOCK 4 WAY LG BORE MALE ST (IV SETS) IMPLANT
SUT ETHILON 3 0 PS 1 (SUTURE) IMPLANT
SUT MNCRL AB 4-0 PS2 18 (SUTURE) ×4 IMPLANT
SUT PROLENE 5 0 C 1 24 (SUTURE) ×2 IMPLANT
SUT PROLENE 6 0 BV (SUTURE) ×2 IMPLANT
SUT SILK 2 0 SH (SUTURE) ×2 IMPLANT
SUT SILK 3-0 18XBRD TIE 12 (SUTURE) IMPLANT
SUT VIC AB 2-0 CT1 TAPERPNT 27 (SUTURE) ×4 IMPLANT
SUT VIC AB 3-0 SH 27X BRD (SUTURE) ×4 IMPLANT
TAPE UMBILICAL 1/8X30 (MISCELLANEOUS) IMPLANT
TOWEL GREEN STERILE (TOWEL DISPOSABLE) ×2 IMPLANT
TRAY FOLEY MTR SLVR 16FR STAT (SET/KITS/TRAYS/PACK) ×2 IMPLANT
UNDERPAD 30X36 HEAVY ABSORB (UNDERPADS AND DIAPERS) ×2 IMPLANT
VALVULOTOME HEAD CUTTING LEMTR (VASCULAR PRODUCTS) IMPLANT
VALVULOTOME LEMAITRE (VASCULAR PRODUCTS) IMPLANT
WATER STERILE IRR 1000ML POUR (IV SOLUTION) ×2 IMPLANT

## 2024-01-08 NOTE — Transfer of Care (Signed)
 Immediate Anesthesia Transfer of Care Note  Patient: ALEJANDRA HUNT  Procedure(s) Performed: RIGHT FEMORAL TO BELOW KNEE POPLITEAL ARTERY BYPASS GRAFT 6mm RINGED PROPATEN GRAFT (Right: Leg Lower) ENDARTERECTOMY,  RIGHT ILIOFEMORAL (Right: Groin)  Patient Location: PACU  Anesthesia Type:General  Level of Consciousness: drowsy  Airway & Oxygen Therapy: Patient Spontanous Breathing and Patient connected to face mask oxygen  Post-op Assessment: Report given to RN and Post -op Vital signs reviewed and stable  Post vital signs: Reviewed and stable  Last Vitals:  Vitals Value Taken Time  BP 164/68 01/08/24 1315  Temp 36.3 C 01/08/24 1212  Pulse 60 01/08/24 1318  Resp 19 01/08/24 1318  SpO2 98 % 01/08/24 1318  Vitals shown include unfiled device data.  Last Pain:  Vitals:   01/08/24 1300  TempSrc:   PainSc: Asleep      Patients Stated Pain Goal: 3 (01/08/24 1245)  Complications: No notable events documented.

## 2024-01-08 NOTE — Interval H&P Note (Signed)
 History and Physical Interval Note:  01/08/2024 8:40 AM  Tina George  has presented today for surgery, with the diagnosis of peripheral arterial disease.  The various methods of treatment have been discussed with the patient and family. After consideration of risks, benefits and other options for treatment, the patient has consented to  Procedure(s): BYPASS GRAFT FEMORAL-POPLITEAL ARTERY (Right) as a surgical intervention.  The patient's history has been reviewed, patient examined, no change in status, stable for surgery.  I have reviewed the patient's chart and labs.  Questions were answered to the patient's satisfaction.     [name]

## 2024-01-08 NOTE — Anesthesia Postprocedure Evaluation (Signed)
 Anesthesia Post Note  Patient: KERRIA SAPIEN  Procedure(s) Performed: RIGHT FEMORAL TO BELOW KNEE POPLITEAL ARTERY BYPASS GRAFT 6mm RINGED PROPATEN GRAFT (Right: Leg Lower) ENDARTERECTOMY,  RIGHT ILIOFEMORAL (Right: Groin)     Patient location during evaluation: PACU Anesthesia Type: General Level of consciousness: awake Pain management: pain level controlled Vital Signs Assessment: post-procedure vital signs reviewed and stable Respiratory status: spontaneous breathing, nonlabored ventilation and respiratory function stable Cardiovascular status: blood pressure returned to baseline and stable Postop Assessment: no apparent nausea or vomiting Anesthetic complications: no   No notable events documented.  Last Vitals:  Vitals:   01/08/24 1420 01/08/24 1425  BP:    Pulse: (!) 59 60  Resp: (!) 21 (!) 21  Temp:    SpO2: 100% 100%    Last Pain:  Vitals:   01/08/24 1353  TempSrc: Oral  PainSc:                  Kendle Erker P Jamale Spangler

## 2024-01-08 NOTE — Plan of Care (Signed)

## 2024-01-08 NOTE — Progress Notes (Signed)
 Patient has congested cough.  She stated she was diagnosed with flu 4 weeks ago.  Just completed doxycycline yesterday.

## 2024-01-08 NOTE — Anesthesia Procedure Notes (Signed)
 Arterial Line Insertion Start/End3/27/2025 9:00 AM Performed by: Darryl Nestle, CRNA, CRNA  Patient location: Pre-op. Preanesthetic checklist: patient identified, IV checked, site marked, risks and benefits discussed, surgical consent, monitors and equipment checked, pre-op evaluation, timeout performed and anesthesia consent Lidocaine 1% used for infiltration radial was placed Catheter size: 20 G Hand hygiene performed  and maximum sterile barriers used   Attempts: 1 Procedure performed without using ultrasound guided technique. Following insertion, dressing applied and Biopatch. Post procedure assessment: normal and unchanged  Patient tolerated the procedure well with no immediate complications. Additional procedure comments: Placed by Nils Pyle, SRNA.

## 2024-01-08 NOTE — Anesthesia Procedure Notes (Addendum)
 Procedure Name: Intubation Date/Time: 01/08/2024 9:24 AM  Performed by: Darryl Nestle, CRNAPre-anesthesia Checklist: Patient identified, Emergency Drugs available, Suction available and Patient being monitored Patient Re-evaluated:Patient Re-evaluated prior to induction Oxygen Delivery Method: Circle system utilized Preoxygenation: Pre-oxygenation with 100% oxygen Induction Type: IV induction Ventilation: Mask ventilation without difficulty Laryngoscope Size: Mac and 3 Grade View: Grade I Tube type: Oral Tube size: 7.5 mm Number of attempts: 1 Airway Equipment and Method: Stylet and Oral airway Placement Confirmation: ETT inserted through vocal cords under direct vision, positive ETCO2 and breath sounds checked- equal and bilateral Secured at: 23 cm Tube secured with: Tape Dental Injury: Teeth and Oropharynx as per pre-operative assessment  Comments: Placed by Nils Pyle, SRNA

## 2024-01-08 NOTE — Op Note (Signed)
 DATE OF SERVICE: 01/08/2024  PATIENT:  Tina George  78 y.o. 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 My assistant helped with exposure through counter tension, suctioning, ligation and retraction to better visualize the surgical field.  My assistant expedited sewing during the case by following my sutures. Wherever I use the term "we" in the report, my assistant actively helped me with that portion of the procedure.  ANESTHESIA:   general  EBL:  BLOOD ADMINISTERED:none  DRAINS: none   LOCAL MEDICATIONS USED:  NONE  SPECIMEN:  none  COUNTS: confirmed correct.  TOURNIQUET:  none  PATIENT DISPOSITION:  PACU - hemodynamically stable.   Delay start of Pharmacological VTE agent (>24hrs) due to surgical blood loss or risk of bleeding: no  INDICATION FOR PROCEDURE: Tina George is a 78 y.o. female with right lower extremity ischemic rest pain and ulceration of the lateral ankle.  Preoperative angiogram shows occlusion of previously placed femoral-popliteal stenting. After careful discussion of risks, benefits, and alternatives the patient was offered femoral-popliteal bypass. The patient understood and wished to proceed.  OPERATIVE FINDINGS:  Severe right common femoral stenosis, much more significant than angiogram suggested. Right below-knee popliteal artery is soft and a good target for distal  anastomosis Good technical result from iliofemoral endarterectomy on the right, with restoration of a palpable pulse into the profunda femoris artery. Good technical result from femoral-popliteal bypass with significant augmentation of right posterior tibial artery signal with graft open.  The Doppler signal at the ankle obliterated with the graft occluded.  DESCRIPTION OF PROCEDURE: After identification of the patient in the pre-operative holding area, the patient was transferred to the operating room. The patient was positioned supine on the operating room table. Anesthesia was induced. The right leg was prepped and draped in standard fashion. A surgical pause was performed confirming correct patient, procedure, and operative location.  Longitudinal incision was made over the common femoral artery in the right groin with a 10 blade.  The incision was carried down through the subcutaneous tissue until the femoral sheath was encountered.  This was entered carefully with Bovie electrocautery.  The common femoral artery was skeletonized.  The artery was heavily diseased and nearly pulseless.  I exposed the common femoral artery bifurcation, including the profunda and superficial femoral artery for several centimeters.  I exposed the external iliac artery for several centimeters under the inguinal ligament.  The circumflex iliac and epigastric branches were looped with Silastic Vesseloops.  The profunda femoris and superficial femoral artery were looped with Silastic Vesseloops.  Dr. Randie Heinz expose the below-knee popliteal artery through a transverse incision in the calf.  The incision was carried down through the subcutaneous tissue using Bovie electrocautery.  The superficial posterior fascia was divided with Bovie electrocautery.  The gastrocnemius was swept posteriorly.  The popliteal vascular bundle was identified.  Several centimeters of below-knee popliteal artery were skeletonized and encircled with  Silastic vessel loop.  Dr. Randie Heinz then brought a Gore tunneler onto the field and developed an anatomic tunnel to connect the 2 exposures.  A 6 mm externally supported PTFE vascular graft was then delivered through the tunnel.  Great care was taken to avoid twisting or kinking the graft.  The patient was systemically heparinized.  Activated clotting time measurements were used at the case to confirm adequate anticoagulation.  Dr. Randie Heinz then sewed the distal anastomosis by creating a arteriotomy with an 11 blade.  This was extended with Potts scissors.  The distal end of the vascular graft was spatulated and sewn end-to-side to the arteriotomy using continuous running suture of 6-0 Prolene.  While Dr. Randie Heinz was working, I performed an iliofemoral endarterectomy.  An 11 blade was used to make an anterior arteriotomy in the common femoral artery.  This was extended with Potts scissors onto the external iliac artery proximally and profunda femoris artery distally.  I then removed all plaque from the distal external iliac artery to the proximal profunda femoris artery using a Astronomer.  Excellent backbleeding was noted from the profunda femoris artery.  Excellent inflow was noted from the external iliac artery.  A bovine pericardial patch was prepared per manufacturer's instructions and then sewn to the arteriotomy using continuous running suture of 5-0 Prolene.  The repair was flushed and de-aired.  Hemostasis was confirmed in the patch.  Clamps were reapplied to the profunda femoris artery and the external iliac artery.  The proximal end of the graft was then sized to sit without undue redundancy or tension.  The proximal end was beveled to allow end to side anastomosis to the patch angioplasty.  A incision was made in the patch and extended with Potts scissors to allow end to side anastomosis of the proximal end of the vascular graft.  This was done with continuous running suture of 5-0 Prolene.  Prior to  completion the anastomosis was flushed and de-aired.  Clamps were released.  Excellent flow was noted through the patch in the past  .  Brisk Doppler flow was heard at the right posterior tibial artery which obliterated with occlusion of the graft.  Hemostasis was again confirmed in the patch, and both anastomoses.  Heparin was reversed with protamine.  Hemostatic agents were applied and hemostasis again confirmed in the surgical beds and anastomosis.  The wounds were copiously irrigated.  The wounds were closed in layers using 2-0 Vicryl, 3-0 Vicryl, 4-0 Monocryl.  Upon completion of the case instrument and sharps counts were confirmed correct. The patient was transferred to the PACU in good condition. I was present for all portions of the procedure.  FOLLOW UP PLAN: Assuming a normal postoperative course, VVS PA will see the patient in 4 weeks with ABI and right lower extremity duplex.  The patient must take Plavix, DOAC, and statin therapies going forward.  The patient was counseled about this preoperatively, and its importance to his long-term graft patency.  She was understanding.Rande Brunt Lenell Antu, MD [facility] Phone Number: [phone] 01/08/2024 12:19 PM

## 2024-01-09 ENCOUNTER — Other Ambulatory Visit (HOSPITAL_COMMUNITY): Payer: Self-pay

## 2024-01-09 ENCOUNTER — Telehealth (HOSPITAL_COMMUNITY): Payer: Self-pay | Admitting: Pharmacy Technician

## 2024-01-09 ENCOUNTER — Encounter (HOSPITAL_COMMUNITY): Payer: Self-pay | Admitting: Vascular Surgery

## 2024-01-09 LAB — LIPID PANEL
Cholesterol: 103 mg/dL (ref 0–200)
HDL: 56 mg/dL (ref >40)
LDL Cholesterol: 36 mg/dL (ref 0–99)
Total CHOL/HDL Ratio: 1.8 ratio
Triglycerides: 56 mg/dL (ref <150)
VLDL: 11 mg/dL (ref 0–40)

## 2024-01-09 LAB — BASIC METABOLIC PANEL WITH GFR
Anion gap: 9 (ref 5–15)
BUN: 21 mg/dL (ref 8–23)
CO2: 25 mmol/L (ref 22–32)
Calcium: 8 mg/dL — ABNORMAL LOW (ref 8.9–10.3)
Chloride: 106 mmol/L (ref 98–111)
Creatinine, Ser: 1.57 mg/dL — ABNORMAL HIGH (ref 0.44–1.00)
GFR, Estimated: 34 mL/min — ABNORMAL LOW (ref >60)
Glucose, Bld: 136 mg/dL — ABNORMAL HIGH (ref 70–99)
Potassium: 3.2 mmol/L — ABNORMAL LOW (ref 3.5–5.1)
Sodium: 140 mmol/L (ref 135–145)

## 2024-01-09 LAB — CBC
HCT: 31.4 % — ABNORMAL LOW (ref 36.0–46.0)
HCT: 31.5 % — ABNORMAL LOW (ref 36.0–46.0)
Hemoglobin: 10 g/dL — ABNORMAL LOW (ref 12.0–15.0)
Hemoglobin: 9.8 g/dL — ABNORMAL LOW (ref 12.0–15.0)
MCH: 31.3 pg (ref 26.0–34.0)
MCH: 31 pg (ref 26.0–34.0)
MCHC: 31.8 g/dL (ref 30.0–36.0)
MCHC: 31.1 g/dL (ref 30.0–36.0)
MCV: 98.1 fL (ref 80.0–100.0)
MCV: 99.7 fL (ref 80.0–100.0)
Platelets: 136 10*3/uL — ABNORMAL LOW (ref 150–400)
Platelets: 140 10*3/uL — ABNORMAL LOW (ref 150–400)
RBC: 3.2 MIL/uL — ABNORMAL LOW (ref 3.87–5.11)
RBC: 3.16 MIL/uL — ABNORMAL LOW (ref 3.87–5.11)
RDW: 14.9 % (ref 11.5–15.5)
RDW: 14.9 % (ref 11.5–15.5)
WBC: 13.6 10*3/uL — ABNORMAL HIGH (ref 4.0–10.5)
WBC: 13.8 10*3/uL — ABNORMAL HIGH (ref 4.0–10.5)
nRBC: 0 % (ref 0.0–0.2)
nRBC: 0 % (ref 0.0–0.2)

## 2024-01-09 LAB — HEPARIN LEVEL (UNFRACTIONATED): Heparin Unfractionated: 0.43 [IU]/mL (ref 0.30–0.70)

## 2024-01-09 LAB — POCT ACTIVATED CLOTTING TIME
Activated Clotting Time: 233 s
Activated Clotting Time: 210 s
Activated Clotting Time: 245 s

## 2024-01-09 MED ORDER — CLOPIDOGREL BISULFATE 75 MG PO TABS
75.0000 mg | ORAL_TABLET | Freq: Every day | ORAL | Status: DC
  Administered 2024-01-09 – 2024-01-13 (×5): 75 mg via ORAL
  Filled 2024-01-09 (×5): qty 1

## 2024-01-09 MED ORDER — HEPARIN (PORCINE) 25000 UT/250ML-% IV SOLN
800.0000 [IU]/h | INTRAVENOUS | Status: DC
  Administered 2024-01-09 – 2024-01-11 (×2): 750 [IU]/h via INTRAVENOUS
  Filled 2024-01-09 (×2): qty 250

## 2024-01-09 MED ORDER — ORAL CARE MOUTH RINSE: 15.0000 mL | OROMUCOSAL | Status: DC | PRN

## 2024-01-09 MED ORDER — ROSUVASTATIN CALCIUM 20 MG PO TABS
40.0000 mg | ORAL_TABLET | Freq: Every day | ORAL | Status: DC
  Administered 2024-01-09 – 2024-01-12 (×4): 40 mg via ORAL
  Filled 2024-01-09 (×4): qty 2

## 2024-01-09 NOTE — Progress Notes (Addendum)
 Physical Therapy Evaluation Patient Details Name: Tina George MRN: [id number] DOB: 1946/10/03 Today's Date: 01/09/2024  History of Present Illness  78 y.o. female presents to [facility]/08/24 for R fem-pop bypass graft and endarterectomy. PMHx: COPD, OA, postural lightheadedness, syncope, bradycardia, PPM  Clinical Impression  Pt in bed upon arrival and agreeable to PT eval. PTA, pt was ModI with a RW for mobility. In today's session, pt was limited by feeling lightheaded and nauseous, vitals stable. Pt was able to stand with RW and MinA for stability. She did not need any physical assist to stand, however, became unsteady with a posterior lean upon reaching an upright position. During gait, pt had x1 LOB requiring MinA to correct with pt stating that she was not paying attention to where she was going. Pt reported that she has not eaten a lot the past few days and agreed to eat lunch. Pt will have 24/7 physical assist from daughter upon return home. Encouraged pt to have daughter close by when she stands and walks due to having impaired balance. Pt currently with functional limitations due to the deficits listed below (see PT Problem List). Pt would benefit from acute skilled PT to address functional impairments. Recommending post-acute HHPT pending progression with mobility to work on balance and safety with ambulation. Acute PT to follow.         If plan is discharge home, recommend the following: A little help with bathing/dressing/bathroom;A lot of help with walking and/or transfers;Assistance with cooking/housework;Assist for transportation;Help with stairs or ramp for entrance   Can travel by private vehicle    Yes    Equipment Recommendations None recommended by PT     Functional Status Assessment Patient has had a recent decline in their functional status and demonstrates the ability to make significant improvements in function in a reasonable and predictable amount of time.      Precautions / Restrictions Precautions Precautions: Fall Restrictions Weight Bearing Restrictions Per Provider Order: No      Mobility  Bed Mobility Overal bed mobility: Needs Assistance Bed Mobility: Supine to Sit    Supine to sit: Contact guard, HOB elevated    General bed mobility comments: increased time and effort, CGA for safety. Cues to shift hips forward to bring feet flat on the ground    Transfers Overall transfer level: Needs assistance Equipment used: Rolling walker (2 wheels) Transfers: Sit to/from Stand Sit to Stand: Min assist      General transfer comment: increased difficulty with R knee flexion to keep R LE underneath self. No assist to rise, however, pt unsteady once in standing with MinA to correct LOB. Occured when standing from EOB and from toilet.    Ambulation/Gait Ambulation/Gait assistance: Min assist Gait Distance (Feet): 30 Feet Assistive device: Rolling walker (2 wheels) Gait Pattern/deviations: Step-through pattern, Staggering left, Staggering right, Narrow base of support, Shuffle Gait velocity: decr     General Gait Details: x1 LOB with pt reporting that she got distracted and was not focusing on where she was going. MinA to correct LOB.     Balance Overall balance assessment: Needs assistance, Mild deficits observed, not formally tested Sitting-balance support: No upper extremity supported, Feet supported Sitting balance-Leahy Scale: Good   Postural control: Posterior lean Standing balance support: Bilateral upper extremity supported, During functional activity, Reliant on assistive device for balance Standing balance-Leahy Scale: Poor Standing balance comment: reliant on RW and external support, posterior lean upon standing upright  Pertinent Vitals/Pain Pain Assessment Pain Assessment: Faces Faces Pain Scale: Hurts a little bit Pain Location: R groin and thigh Pain Descriptors / Indicators: Aching, Discomfort Pain  Intervention(s): Limited activity within patient's tolerance, Monitored during session, Repositioned    Home Living Family/patient expects to be discharged to:: Private residence Living Arrangements: Children (daughter) Available Help at Discharge: Available 24 hours/day;Family Type of Home: House Home Access: Stairs to enter Entrance Stairs-Rails: Left;Right;Can reach both Entrance Stairs-Number of Steps: 1   Home Layout: One level Home Equipment: Shower seat - built in;Grab bars - toilet;Grab bars - tub/shower;Rolling Walker (2 wheels);Cane - quad;BSC/3in1 Additional Comments: 1 step to enter from the backyard, 2 steps from the garage    Prior Function Prior Level of Function : Independent/Modified Independent      Mobility Comments: ModI with a RW, reports having decreased balance/weakness but no falls       Extremity/Trunk Assessment   Upper Extremity Assessment Upper Extremity Assessment: Defer to OT evaluation    Lower Extremity Assessment Lower Extremity Assessment: RLE deficits/detail RLE Deficits / Details: decresed knee flexion ROM 2/2 pain, deferred MMT due to pain. Alert to light touch    Cervical / Trunk Assessment Cervical / Trunk Assessment: Normal  Communication   Communication Communication: Impaired Factors Affecting Communication: Hearing impaired    Cognition Arousal: Alert Behavior During Therapy: WFL for tasks assessed/performed   PT - Cognitive impairments: No family/caregiver present to determine baseline, Safety/Judgement    PT - Cognition Comments: decreased safety awareness with losing her balance Following commands: Intact       Cueing Cueing Techniques: Verbal cues      PT Assessment Patient needs continued PT services  PT Problem List Decreased range of motion;Decreased strength;Decreased activity tolerance;Decreased balance;Decreased mobility;Decreased safety awareness       PT Treatment Interventions DME instruction;Gait  training;Stair training;Functional mobility training;Therapeutic activities;Therapeutic exercise;Balance training;Neuromuscular re-education;Patient/family education    PT Goals (Current goals can be found in the Care Plan section)  Acute Rehab PT Goals Patient Stated Goal: to get stronger PT Goal Formulation: With patient Time For Goal Achievement: 01/23/24 Potential to Achieve Goals: Good    Frequency Min 2X/week        AM-PAC PT "6 Clicks" Mobility  Outcome Measure Help needed turning from your back to your side while in a flat bed without using bedrails?: A Little Help needed moving from lying on your back to sitting on the side of a flat bed without using bedrails?: A Little Help needed moving to and from a bed to a chair (including a wheelchair)?: A Little Help needed standing up from a chair using your arms (e.g., wheelchair or bedside chair)?: A Little Help needed to walk in hospital room?: A Little Help needed climbing 3-5 steps with a railing? : A Lot 6 Click Score: 17    End of Session Equipment Utilized During Treatment: Gait belt Activity Tolerance: Patient tolerated treatment well Patient left: in chair;with call bell/phone within reach Nurse Communication: Mobility status;Other (comment) (pt bleeding from IV site) PT Visit Diagnosis: Unsteadiness on feet (R26.81);Other abnormalities of gait and mobility (R26.89);Muscle weakness (generalized) (M62.81)    Time: 5409-8119 PT Time Calculation (min) (ACUTE ONLY): 36 min   Charges:   PT Evaluation $PT Eval Low Complexity: 1 Low PT Treatments $Gait Training: 8-22 mins PT General Charges $$ ACUTE PT VISIT: 1 Visit       Hilton Cork, PT, DPT Secure Chat Preferred  Rehab Office [phone]   Arturo Morton Brion Aliment  01/09/2024, 1:10 PM

## 2024-01-09 NOTE — Progress Notes (Signed)
 PHARMACY - ANTICOAGULATION CONSULT NOTE  Pharmacy Consult for heparin Indication: therapeutic heparin for plastic bypass   Allergies  Allergen Reactions   Aspirin Itching, Swelling and Other (See Comments)    Tongue swelling   Levaquin [Levofloxacin In D5w] Other (See Comments)    Severe yeast reaction.   Codeine Rash    Patient Measurements: Height: 5\' 7"  (170.2 cm) Weight: 61.7 kg (136 lb) IBW/kg (Calculated) : 61.6 HEPARIN DW (KG): 61.7  Vital Signs: Temp: 98.2 F (36.8 C) (03/28 0318) Temp Source: Oral (03/28 0318) BP: 115/56 (03/28 0318) Pulse Rate: 60 (03/28 0318)  Labs: Recent Labs    01/08/24 0830 01/08/24 0945 01/09/24 0401  HGB 13.2  --  10.0*  HCT 41.7  --  31.4*  PLT 169  --  136*  APTT  --  26  --   LABPROT SPECIMEN CLOTTED 13.3  --   INR SPECIMEN CLOTTED 1.0  --   CREATININE 1.30*  --  1.57*    Estimated Creatinine Clearance: 29.2 mL/min (A) (by C-G formula based on SCr of 1.57 mg/dL (H)).   Medical History: Past Medical History:  Diagnosis Date   Anemia 09/20/2014   Asthma 05/06/2014   Bilateral leg edema 09/15/2020   Bradycardia 09/28/2023   COPD (chronic obstructive pulmonary disease) (HCC)    Daytime somnolence 09/15/2020   Depression    Dyslipidemia    GERD (gastroesophageal reflux disease)    GERD (gastroesophageal reflux disease)    High blood pressure    Hypercholesterolemia 09/20/2014   Hypokalemia    Medication management 09/15/2020   Murmur 09/15/2020   Muscle cramps    Osteoarthritis 09/20/2014   Other chest pain 09/15/2020   Pain in both lower extremities 09/15/2020   Palpitations 08/06/2023   Postural lightheadedness 08/18/2023   Precordial pain 09/15/2020   Presence of permanent cardiac pacemaker    Psoriasis    Snoring 09/15/2020   SOB (shortness of breath) 09/15/2020   Symptomatic bradycardia 09/27/2023   Syncope and collapse 08/06/2023   Vitamin D deficiency 09/20/2014     Assessment: 78yoF s/p femoral to  below-knee popliteal artery bypass. Vascular has consulted to start therapeutic heparin infusion with plan to discharge home on DOAC. Given recent surgery, will forgo heparin bolus.   Eliquis 5mg  BID copay is $442.64, Xarlet20mg  QD copay is $441.06 , copay so high due to a $590.00 deductible.    Goal of Therapy:  Heparin level 0.3-0.7 units/ml Monitor platelets by anticoagulation protocol: Yes   Plan:  Start heparin infusion at 750 units/hr Check anti-Xa level in 8 hours and daily while on heparin Continue to monitor H&H and platelets Transition to DOAC at discharge  Ruben Im, PharmD Clinical Pharmacist 01/09/2024 8:36 AM Please check AMION for all [facility] Pharmacy numbers

## 2024-01-09 NOTE — Discharge Instructions (Addendum)
 [facility]  Discharge instructions  Lower Extremity Bypass Surgery  Please refer to the following instruction for your post-procedure care. Your surgeon or physician assistant will discuss any changes with you.  Activity  You are encouraged to walk as much as you can. You can slowly return to normal activities during the month after your surgery. Avoid strenuous activity and heavy lifting until your doctor tells you it's OK. Avoid activities such as vacuuming or swinging a golf club. Do not drive until your doctor give the OK and you are no longer taking prescription pain medications. It is also normal to have difficulty with sleep habits, eating and bowel movement after surgery. These will go away with time.  Bathing/Showering  You may shower after you go home. Do not soak in a bathtub, hot tub, or swim until the incision heals completely.  Incision Care  Clean your incision with mild soap and water. Shower every day. Pat the area dry with a clean towel. You do not need a bandage unless otherwise instructed. Do not apply any ointments or creams to your incision. If you have open wounds you will be instructed how to care for them or a visiting nurse may be arranged for you. If you have staples or sutures along your incision they will be removed at your post-op appointment. You may have skin glue on your incision. Do not peel it off. It will come off on its own in about one week. If you have a great deal of moisture in your groin, use a gauze help keep this area dry.  Diet  Resume your normal diet. There are no special food restrictions following this procedure. A low fat/ low cholesterol diet is recommended for all patients with vascular disease. In order to heal from your surgery, it is CRITICAL to get adequate nutrition. Your body requires vitamins, minerals, and protein. Vegetables are the best source of vitamins and minerals. Vegetables also provide the  perfect balance of protein. Processed food has little nutritional value, so try to avoid this.  Medications  Resume taking all your medications unless your doctor or nurse practitioner tells you not to. If your incision is causing pain, you may take over-the-counter pain relievers such as acetaminophen (Tylenol). If you were prescribed a stronger pain medication, please aware these medication can cause nausea and constipation. Prevent nausea by taking the medication with a snack or meal. Avoid constipation by drinking plenty of fluids and eating foods with high amount of fiber, such as fruits, vegetables, and grains. Take Colase 100 mg (an over-the-counter stool softener) twice a day as needed for constipation. Do not take Tylenol if you are taking prescription pain medications.  Follow Up  Our office will schedule a follow up appointment 2-3 weeks following discharge.  Please call us immediately for any of the following conditions  Severe or worsening pain in your legs or feet while at rest or while walking Increase pain, redness, warmth, or drainage (pus) from your incision site(s) Fever of 101 degree or higher The swelling in your leg with the bypass suddenly worsens and becomes more painful than when you were in the hospital If you have been instructed to feel your graft pulse then you should do so every day. If you can no longer feel this pulse, call the office immediately. Not all patients are given this instruction.  Leg swelling is common after leg bypass surgery.  The swelling should improve over a few months  following surgery. To improve the swelling, you may elevate your legs above the level of your heart while you are sitting or resting. Your surgeon or physician assistant may ask you to apply an ACE wrap or wear compression (TED) stockings to help to reduce swelling.  Reduce your risk of vascular disease  Stop smoking. If you would like help call [facility] at 1-800-QUIT-NOW  ([phone]) or [facility] at 6516808011.  Manage your cholesterol Maintain a desired weight Control your diabetes weight Control your diabetes Keep your blood pressure down  If you have any questions, please call the office at [phone]  ================================================================================================================================================================================================= Information on my medicine - Coumadin   (Warfarin)  This medication education was reviewed with me or my healthcare representative as part of my discharge preparation.    Why was Coumadin prescribed for you? Coumadin was prescribed for you because you have a blood clot or a medical condition that can cause an increased risk of forming blood clots. Blood clots can cause serious health problems by blocking the flow of blood to the heart, lung, or brain. Coumadin can prevent harmful blood clots from forming. As a reminder your indication for Coumadin is:  Blood Clot Prevention after Orthopedic Surgery  What test will check on my response to Coumadin? While on Coumadin (warfarin) you will need to have an INR test regularly to ensure that your dose is keeping you in the desired range. The INR (international normalized ratio) number is calculated from the result of the laboratory test called prothrombin time (PT).  If an INR APPOINTMENT HAS NOT ALREADY BEEN MADE FOR YOU please schedule an appointment to have this lab work done by your health care provider within 7 days. Your INR goal is usually a number between:  2 to 3 or your provider may give you a more narrow range like 2-2.5.  Ask your health care provider during an office visit what your goal INR is.  What  do you need to  know  About  COUMADIN? Take Coumadin (warfarin) exactly as prescribed by your healthcare provider about the same time each day.  DO NOT stop taking without talking to the doctor who  prescribed the medication.  Stopping without other blood clot prevention medication to take the place of Coumadin may increase your risk of developing a new clot or stroke.  Get refills before you run out.  What do you do if you miss a dose? If you miss a dose, take it as soon as you remember on the same day then continue your regularly scheduled regimen the next day.  Do not take two doses of Coumadin at the same time.  Important Safety Information A possible side effect of Coumadin (Warfarin) is an increased risk of bleeding. You should call your healthcare provider right away if you experience any of the following: Bleeding from an injury or your nose that does not stop. Unusual colored urine (red or dark brown) or unusual colored stools (red or black). Unusual bruising for unknown reasons. A serious fall or if you hit your head (even if there is no bleeding).  Some foods or medicines interact with Coumadin (warfarin) and might alter your response to warfarin. To help avoid this: Eat a balanced diet, maintaining a consistent amount of Vitamin K. Notify your provider about major diet changes you plan to make. Avoid alcohol or limit your intake to 1 drink for women and 2 drinks for men per day. (1 drink is 5 oz. wine, 12 oz.  beer, or 1.5 oz. liquor.)  Make sure that ANY health care provider who prescribes medication for you knows that you are taking Coumadin (warfarin).  Also make sure the healthcare provider who is monitoring your Coumadin knows when you have started a new medication including herbals and non-prescription products.  Coumadin (Warfarin)  Major Drug Interactions  Increased Warfarin Effect Decreased Warfarin Effect  Alcohol (large quantities) Antibiotics (esp. Septra/Bactrim, Flagyl, Cipro) Amiodarone (Cordarone) Aspirin (ASA) Cimetidine (Tagamet) Megestrol (Megace) NSAIDs (ibuprofen, naproxen, etc.) Piroxicam (Feldene) Propafenone (Rythmol SR) Propranolol  (Inderal) Isoniazid (INH) Posaconazole (Noxafil) Barbiturates (Phenobarbital) Carbamazepine (Tegretol) Chlordiazepoxide (Librium) Cholestyramine (Questran) Griseofulvin Oral Contraceptives Rifampin Sucralfate (Carafate) Vitamin K   Coumadin (Warfarin) Major Herbal Interactions  Increased Warfarin Effect Decreased Warfarin Effect  Garlic Ginseng Ginkgo biloba Coenzyme Q10 Green tea St. John's wort    Coumadin (Warfarin) FOOD Interactions  Eat a consistent number of servings per week of foods HIGH in Vitamin K (1 serving =  cup)  Collards (cooked, or boiled & drained) Kale (cooked, or boiled & drained) Mustard greens (cooked, or boiled & drained) Parsley *serving size only =  cup Spinach (cooked, or boiled & drained) Swiss chard (cooked, or boiled & drained) Turnip greens (cooked, or boiled & drained)  Eat a consistent number of servings per week of foods MEDIUM-HIGH in Vitamin K (1 serving = 1 cup)  Asparagus (cooked, or boiled & drained) Broccoli (cooked, boiled & drained, or raw & chopped) Brussel sprouts (cooked, or boiled & drained) *serving size only =  cup Lettuce, raw (green leaf, endive, romaine) Spinach, raw Turnip greens, raw & chopped   These websites have more information on Coumadin (warfarin):  http://www.king-russell.com/; https://www.hines.net/;

## 2024-01-09 NOTE — Progress Notes (Addendum)
  Progress Note    01/09/2024 7:46 AM 1 Day Post-Op  Subjective:  NO rest pain R foot; now able to wiggle toes   Vitals:   01/09/24 0038 01/09/24 0318  BP: (!) 120/49 (!) 115/56  Pulse: 70 60  Resp:  20  Temp:  98.2 F (36.8 C)  SpO2:  97%   Physical Exam:  Lungs:  non labored Incisions:  R groin and calf incisions without hematoma Extremities:  brisk DP and PT by doppler Neurologic: A&O  CBC    Component Value Date/Time   WBC 13.6 (H) 01/09/2024 0401   RBC 3.20 (L) 01/09/2024 0401   HGB 10.0 (L) 01/09/2024 0401   HGB 13.2 04/22/2023 1445   HCT 31.4 (L) 01/09/2024 0401   HCT 40.0 04/22/2023 1445   PLT 136 (L) 01/09/2024 0401   PLT 224 04/22/2023 1445   MCV 98.1 01/09/2024 0401   MCV 96 04/22/2023 1445   MCH 31.3 01/09/2024 0401   MCHC 31.8 01/09/2024 0401   RDW 14.9 01/09/2024 0401   RDW 13.4 04/22/2023 1445   LYMPHSABS 1.4 12/26/2023 1126   LYMPHSABS 1.4 04/22/2023 1445   MONOABS 0.7 12/26/2023 1126   EOSABS 0.2 12/26/2023 1126   EOSABS 0.1 04/22/2023 1445   BASOSABS 0.0 12/26/2023 1126   BASOSABS 0.0 04/22/2023 1445    BMET    Component Value Date/Time   NA 140 01/09/2024 0401   NA 143 05/26/2023 1307   K 3.2 (L) 01/09/2024 0401   CL 106 01/09/2024 0401   CO2 25 01/09/2024 0401   GLUCOSE 136 (H) 01/09/2024 0401   BUN 21 01/09/2024 0401   BUN 18 05/26/2023 1307   CREATININE 1.57 (H) 01/09/2024 0401   CALCIUM 8.0 (L) 01/09/2024 0401   GFRNONAA 34 (L) 01/09/2024 0401   GFRAA 67 05/05/2019 1753    INR    Component Value Date/Time   INR 1.0 01/08/2024 0945     Intake/Output Summary (Last 24 hours) at 01/09/2024 0746 Last data filed at 01/09/2024 0600 Gross per 24 hour  Intake 1194.15 ml  Output 705 ml  Net 489.15 ml     Assessment/Plan:  78 y.o. female is s/p R femoral to BK popliteal bypass with PTFE; R CFA endarterectomy 1 Day Post-Op   R foot well perfused with DP and PT signals by doppler; motor and sensation subjectively improved  in the foot Incisions are well appearing D/c foley Initiate IV heparin; will ask pharmacy to check affordability for DOAC at discharge Resume plavix OOB with therapy; possible d/c tomorrow or Sunday   Emilie Rutter, PA-C [facility] [phone] 01/09/2024 7:46 AM  VASCULAR STAFF ADDENDUM: I have independently interviewed and examined the patient. I agree with the above.   Rande Brunt. Lenell Antu, MD [facility] Phone Number: [phone] 01/09/2024 10:59 AM

## 2024-01-09 NOTE — Evaluation (Signed)
 Occupational Therapy Evaluation Patient Details Name: Tina George MRN: [id number] DOB: June 30, 1946 Today's Date: 01/09/2024   History of Present Illness   78 y.o. female [facility]08[facility] for R fem-pop bypass graft and endarterectomy. PMHx: COPD, OA, postural lightheadedness, syncope, bradycardia, PPM     Clinical Impressions Pt was ambulatory with a RW prior to admission and mod I for ADLs. She lives with her daughter. Pt presents with unsteadiness in standing with posterior bias requiring min assist for sit to stand and transfers to [facility]. She needs set up to moderate assistance for ADLs. Began educating pt in compensatory strategies for LB ADLs. Pt has DME for seated showering and a long handled bath sponge at home. Pt likely to progress well and not require post acute OT.      If plan is discharge home, recommend the following:   A little help with walking and/or transfers;A little help with bathing/dressing/bathroom;Assistance with cooking/housework;Assist for transportation;Help with stairs or ramp for entrance     Functional Status Assessment   Patient has had a recent decline in their functional status and demonstrates the ability to make significant improvements in function in a reasonable and predictable amount of time.     Equipment Recommendations   None recommended by OT     Recommendations for Other Services         Precautions/Restrictions   Precautions Precautions: Fall Recall of Precautions/Restrictions: Intact Restrictions Weight Bearing Restrictions Per Provider Order: No     Mobility Bed Mobility Overal bed mobility: Needs Assistance Bed Mobility: Sit to Supine, Supine to Sit     Supine to sit: Contact guard, HOB elevated Sit to supine: Min assist   General bed mobility comments: assisted R LE back into bed    Transfers Overall transfer level: Needs assistance Equipment used: Rolling walker (2 wheels) Transfers: Sit to/from  Stand, Bed to chair/wheelchair/BSC Sit to Stand: Min assist     Step pivot transfers: Min assist     General transfer comment: steadying assist, posterior bias upon initially standing      Balance Overall balance assessment: Needs assistance, Mild deficits observed, not formally tested Sitting-balance support: No upper extremity supported, Feet supported Sitting balance-Leahy Scale: Good       Standing balance-Leahy Scale: Poor                             ADL either performed or assessed with clinical judgement   ADL Overall ADL's : Needs assistance/impaired Eating/Feeding: Independent;Bed level   Grooming: Wash/dry hands;Sitting;Set up   Upper Body Bathing: Set up;Sitting   Lower Body Bathing: Minimal assistance;Sit to/from stand;Sitting/lateral leans   Upper Body Dressing : Set up;Sitting   Lower Body Dressing: Moderate assistance;Sit to/from stand   Toilet Transfer: Minimal assistance;Stand-pivot;BSC/3in1;Rolling walker (2 wheels)   Toileting- Clothing Manipulation and Hygiene: Set up;Sitting/lateral lean         General ADL Comments: Educated in dressing operated leg first and then R, use of long bath sponge to reach R foot.     Vision Ability to See in Adequate Light: 0 Adequate Patient Visual Report: No change from baseline       Perception         Praxis         Pertinent Vitals/Pain Pain Assessment Pain Assessment: Faces Faces Pain Scale: Hurts a little bit Pain Location: R groin and thigh Pain Descriptors / Indicators: Aching, Discomfort Pain Intervention(s): Monitored during  session, Repositioned     Extremity/Trunk Assessment Upper Extremity Assessment Upper Extremity Assessment: Right hand dominant;Overall [facility] for tasks assessed   Lower Extremity Assessment Lower Extremity Assessment: Defer to PT evaluation RLE Deficits / Details: decresed knee flexion ROM 2/2 pain, deferred MMT due to pain. Alert to light touch    Cervical / Trunk Assessment Cervical / Trunk Assessment: Normal   Communication Communication Communication: Impaired Factors Affecting Communication: Hearing impaired   Cognition Arousal: Alert Behavior During Therapy: WFL for tasks assessed/performed               OT - Cognition Comments: some mild memory deficits                 Following commands: Intact       Cueing  General Comments   Cueing Techniques: Verbal cues  Discussed continuing to work on R LE ROM. Discussed hip marches and LAQ.   Exercises     Shoulder Instructions      Home Living Family/patient expects to be discharged to:: Private residence Living Arrangements: Children Available Help at Discharge: Available 24 hours/day;Family Type of Home: House Home Access: Stairs to enter [employer] of Steps: 1 Entrance Stairs-Rails: Left;Right;Can reach both Home Layout: One level     Bathroom Shower/Tub: Producer, television/film/video: Standard Bathroom Accessibility: Yes   Home Equipment: Shower seat - built in;Grab bars - toilet;Grab bars - tub/shower;Rolling Environmental consultant (2 wheels);Cane - quad;BSC/3in1;Adaptive equipment Adaptive Equipment: Long-handled sponge Additional Comments: 1 step to enter from the backyard, 2 steps from the garage      Prior Functioning/Environment Prior Level of Function : Independent/Modified Independent             Mobility Comments: ModI with a RW, reports having decreased balance/weakness but no falls ADLs Comments: Mod I for ADLs    OT Problem List: Impaired balance (sitting and/or standing);Pain   OT Treatment/Interventions: Self-care/ADL training;DME and/or AE instruction;Therapeutic activities;Patient/family education;Balance training      OT Goals(Current goals can be found in the care plan section)   Acute Rehab OT Goals OT Goal Formulation: With patient Time For Goal Achievement: 01/23/24 Potential to Achieve Goals: Good ADL  Goals Pt Will Perform Grooming: with supervision;standing Pt Will Perform Lower Body Bathing: with supervision;with adaptive equipment;sit to/from stand Pt Will Perform Lower Body Dressing: with supervision;sit to/from stand Pt Will Transfer to Toilet: with supervision;ambulating Pt Will Perform Toileting - Clothing Manipulation and hygiene: with supervision;sit to/from stand Pt Will Perform Tub/Shower Transfer: Shower transfer;with supervision;rolling walker;shower seat;grab bars Additional ADL Goal #1: Pt will complete bed mobility independently in preparation for ADLs.   OT Frequency:  Min 2X/week    Co-evaluation              AM-PAC OT "6 Clicks" Daily Activity     Outcome Measure Help from another person eating meals?: None Help from another person taking care of personal grooming?: A Little Help from another person toileting, which includes using toliet, bedpan, or urinal?: A Little Help from another person bathing (including washing, rinsing, drying)?: A Lot Help from another person to put on and taking off regular upper body clothing?: A Little Help from another person to put on and taking off regular lower body clothing?: A Lot 6 Click Score: 17   End of Session Equipment Utilized During Treatment: Gait belt;Rolling walker (2 wheels)  Activity Tolerance: Patient tolerated treatment well Patient left: in bed;with call bell/phone within reach;with bed alarm set  OT  Visit Diagnosis: Unsteadiness on feet (R26.81);Other abnormalities of gait and mobility (R26.89);Pain                Time: 1610-9604 OT Time Calculation (min): 20 min Charges:  OT General Charges $OT Visit: 1 Visit OT Evaluation $OT Eval Moderate Complexity: 1 Mod  Berna Spare, OTR/L Acute Rehabilitation Services Office: [phone]   Evern Bio 01/09/2024, 2:56 PM

## 2024-01-09 NOTE — Progress Notes (Signed)
 PHARMACY - ANTICOAGULATION CONSULT NOTE  Pharmacy Consult for heparin Indication: therapeutic heparin for plastic bypass   Allergies  Allergen Reactions   Aspirin Itching, Swelling and Other (See Comments)    Tongue swelling   Levaquin [Levofloxacin In D5w] Other (See Comments)    Severe yeast reaction.   Codeine Rash    Patient Measurements: Height: 5\' 7"  (170.2 cm) Weight: 61.7 kg (136 lb) IBW/kg (Calculated) : 61.6 HEPARIN DW (KG): 61.7  Vital Signs: Temp: 97.9 F (36.6 C) (03/28 2010) Temp Source: Oral (03/28 2010) BP: 108/49 (03/28 2010) Pulse Rate: 60 (03/28 2010)  Labs: Recent Labs    01/08/24 0830 01/08/24 0945 01/09/24 0401 01/09/24 0940 01/09/24 1958  HGB 13.2  --  10.0* 9.8*  --   HCT 41.7  --  31.4* 31.5*  --   PLT 169  --  136* 140*  --   APTT  --  26  --   --   --   LABPROT SPECIMEN CLOTTED 13.3  --   --   --   INR SPECIMEN CLOTTED 1.0  --   --   --   HEPARINUNFRC  --   --   --   --  0.43  CREATININE 1.30*  --  1.57*  --   --     Estimated Creatinine Clearance: 29.2 mL/min (A) (by C-G formula based on SCr of 1.57 mg/dL (H)).   Medical History: Past Medical History:  Diagnosis Date   Anemia 09/20/2014   Asthma 05/06/2014   Bilateral leg edema 09/15/2020   Bradycardia 09/28/2023   COPD (chronic obstructive pulmonary disease) (HCC)    Daytime somnolence 09/15/2020   Depression    Dyslipidemia    GERD (gastroesophageal reflux disease)    GERD (gastroesophageal reflux disease)    High blood pressure    Hypercholesterolemia 09/20/2014   Hypokalemia    Medication management 09/15/2020   Murmur 09/15/2020   Muscle cramps    Osteoarthritis 09/20/2014   Other chest pain 09/15/2020   Pain in both lower extremities 09/15/2020   Palpitations 08/06/2023   Postural lightheadedness 08/18/2023   Precordial pain 09/15/2020   Presence of permanent cardiac pacemaker    Psoriasis    Snoring 09/15/2020   SOB (shortness of breath) 09/15/2020    Symptomatic bradycardia 09/27/2023   Syncope and collapse 08/06/2023   Vitamin D deficiency 09/20/2014     Assessment: 78yoF s/p femoral to below-knee popliteal artery bypass. Vascular has consulted to start therapeutic heparin infusion with plan to discharge home on DOAC. Given recent surgery, will forgo heparin bolus.   Heparin level therapeutic, per RN oozing at IV sites is resolved.  Goal of Therapy:  Heparin level 0.3-0.7 units/ml Monitor platelets by anticoagulation protocol: Yes   Plan:  Continue heparin 750 units/h Daily heparin level and CBC  Fredonia Highland, PharmD, [place], [facility] Clinical Pharmacist [phone] Please check AMION for all [facility] Pharmacy numbers 01/09/2024

## 2024-01-09 NOTE — Telephone Encounter (Signed)
 Patient Product/process development scientist completed.    The patient is insured through [employer]. Patient has Medicare and is not eligible for a copay card, but may be able to apply for patient assistance or Medicare RX Payment Plan (Patient Must reach out to their plan, if eligible for payment plan), if available.    Ran test claim for Eliquis 5 mg and the current 30 day co-pay is $442.64 due to a $590.00 deductible.  Ran test claim for Xarelto 20 mg and the current 30 day co-pay is $441.06 due to a $590.00 deductible.  This test claim was processed through [facility]- copay amounts may vary at other pharmacies due to pharmacy/plan contracts, or as the patient moves through the different stages of their insurance plan.     Roland Earl, CPHT Pharmacy Technician III Certified Patient Advocate [facility] Pharm[facility] Patient Advocate Team Direct Number: [phone]  Fax: [phone]

## 2024-01-10 LAB — CBC
HCT: 30.9 % — ABNORMAL LOW (ref 36.0–46.0)
Hemoglobin: 9.8 g/dL — ABNORMAL LOW (ref 12.0–15.0)
MCH: 31.3 pg (ref 26.0–34.0)
MCHC: 31.7 g/dL (ref 30.0–36.0)
MCV: 98.7 fL (ref 80.0–100.0)
Platelets: 113 10*3/uL — ABNORMAL LOW (ref 150–400)
RBC: 3.13 MIL/uL — ABNORMAL LOW (ref 3.87–5.11)
RDW: 14.9 % (ref 11.5–15.5)
WBC: 11.8 10*3/uL — ABNORMAL HIGH (ref 4.0–10.5)
nRBC: 0 % (ref 0.0–0.2)

## 2024-01-10 LAB — HEPARIN LEVEL (UNFRACTIONATED): Heparin Unfractionated: 0.48 [IU]/mL (ref 0.30–0.70)

## 2024-01-10 MED ORDER — WARFARIN - PHARMACIST DOSING INPATIENT: Freq: Every day | Status: DC

## 2024-01-10 MED ORDER — WARFARIN SODIUM 5 MG PO TABS
5.0000 mg | ORAL_TABLET | Freq: Once | ORAL | Status: AC
  Administered 2024-01-10: 5 mg via ORAL
  Filled 2024-01-10: qty 1

## 2024-01-10 NOTE — Progress Notes (Signed)
 Physical Therapy Treatment Patient Details Name: Tina George MRN: [id number] DOB: June 03, 1946 Today's Date: 01/10/2024   History of Present Illness 78 y.o. female presents to [facility] 01/08/24 for R fem-pop bypass graft and endarterectomy. PMHx: COPD, OA, postural lightheadedness, syncope, bradycardia, PPM    PT Comments  Attempted to see pt @ 10:45 this morning with pt having just finished OT and requesting some rest time between sessions. On return this afternoon, pt just back to bed with nursing (RN still in the room). Pt reported she had been up all day and was too fatigued to get up and walk right now. Agreed to RLE ROM exercises and pt return demonstrated these. Encouraged to continue to do these throughout her waking hours. Pt now reporting she will need a BSC at home--for over the toilet during the day and at bedside during the night (she gets up frequently). Discussed PT will return earlier tomorrow to assess her mobility and ability to do stairs to enter her home.     If plan is discharge home, recommend the following: A little help with bathing/dressing/bathroom;A lot of help with walking and/or transfers;Assistance with cooking/housework;Assist for transportation;Help with stairs or ramp for entrance   Can travel by private vehicle        Equipment Recommendations  BSC/3in1    Recommendations for Other Services       Precautions / Restrictions Precautions Precautions: Fall Recall of Precautions/Restrictions: Intact Restrictions Weight Bearing Restrictions Per Provider Order: No     Mobility  Bed Mobility               General bed mobility comments: pt just back to bed with nursing    Transfers                        Ambulation/Gait                   Stairs             Wheelchair Mobility     Tilt Bed    Modified Rankin (Stroke Patients Only)       Balance                                             Communication Communication Communication: Impaired Factors Affecting Communication: Hearing impaired  Cognition Arousal: Alert Behavior During Therapy: WFL for tasks assessed/performed   PT - Cognitive impairments: No apparent impairments                         Following commands: Intact      Cueing Cueing Techniques: Verbal cues  Exercises General Exercises - Lower Extremity Ankle Circles/Pumps: AROM, Right, 10 reps Heel Slides: AAROM, Right, 10 reps Hip ABduction/ADduction: AROM, Right, 5 reps    General Comments        Pertinent Vitals/Pain Pain Assessment Pain Assessment: Faces Faces Pain Scale: Hurts even more Pain Location: R groin and thigh Pain Descriptors / Indicators: Aching, Discomfort Pain Intervention(s): Limited activity within patient's tolerance, Monitored during session    Home Living             Entrance Stairs-Number of Steps: 2            Prior Function            PT Goals (current  goals can now be found in the care plan section) Acute Rehab PT Goals Patient Stated Goal: to get stronger Time For Goal Achievement: 01/23/24 Potential to Achieve Goals: Good Progress towards PT goals: Progressing toward goals    Frequency    Min 2X/week      PT Plan      Co-evaluation              AM-PAC PT "6 Clicks" Mobility   Outcome Measure  Help needed turning from your back to your side while in a flat bed without using bedrails?: A Little Help needed moving from lying on your back to sitting on the side of a flat bed without using bedrails?: A Little Help needed moving to and from a bed to a chair (including a wheelchair)?: A Little Help needed standing up from a chair using your arms (e.g., wheelchair or bedside chair)?: A Little Help needed to walk in hospital room?: A Little Help needed climbing 3-5 steps with a railing? : A Lot 6 Click Score: 17    End of Session   Activity Tolerance: Patient limited by  fatigue Patient left: with call bell/phone within reach;in bed;with bed alarm set   PT Visit Diagnosis: Unsteadiness on feet (R26.81);Other abnormalities of gait and mobility (R26.89);Muscle weakness (generalized) (M62.81)     Time: 1610-9604 PT Time Calculation (min) (ACUTE ONLY): 16 min  Charges:    $Therapeutic Exercise: 8-22 mins PT General Charges $$ ACUTE PT VISIT: 1 Visit                      Jerolyn Center, PT Acute Rehabilitation Services  Office [phone]    Zena Amos 01/10/2024, 2:41 PM

## 2024-01-10 NOTE — Progress Notes (Addendum)
 PHARMACY - ANTICOAGULATION CONSULT NOTE  Pharmacy Consult for heparin and warfarin  Indication: therapeutic heparin for PTFE bypass to maintian patenancy   Allergies  Allergen Reactions   Aspirin Itching, Swelling and Other (See Comments)    Tongue swelling   Levaquin [Levofloxacin In D5w] Other (See Comments)    Severe yeast reaction.   Codeine Rash    Patient Measurements: Height: 5\' 7"  (170.2 cm) Weight: 61.7 kg (136 lb) IBW/kg (Calculated) : 61.6 HEPARIN DW (KG): 61.7  Vital Signs: Temp: 98.7 F (37.1 C) (03/29 0333) Temp Source: Oral (03/29 0333) BP: 102/56 (03/29 0333) Pulse Rate: 61 (03/29 0333)  Labs: Recent Labs    01/08/24 0830 01/08/24 0945 01/09/24 0401 01/09/24 0940 01/09/24 1958 01/10/24 0410  HGB 13.2  --  10.0* 9.8*  --  9.8*  HCT 41.7  --  31.4* 31.5*  --  30.9*  PLT 169  --  136* 140*  --  113*  APTT  --  26  --   --   --   --   LABPROT SPECIMEN CLOTTED 13.3  --   --   --   --   INR SPECIMEN CLOTTED 1.0  --   --   --   --   HEPARINUNFRC  --   --   --   --  0.43 0.48  CREATININE 1.30*  --  1.57*  --   --   --     Estimated Creatinine Clearance: 29.2 mL/min (A) (by C-G formula based on SCr of 1.57 mg/dL (H)).   Medical History: Past Medical History:  Diagnosis Date   Anemia 09/20/2014   Asthma 05/06/2014   Bilateral leg edema 09/15/2020   Bradycardia 09/28/2023   COPD (chronic obstructive pulmonary disease) (HCC)    Daytime somnolence 09/15/2020   Depression    Dyslipidemia    GERD (gastroesophageal reflux disease)    GERD (gastroesophageal reflux disease)    High blood pressure    Hypercholesterolemia 09/20/2014   Hypokalemia    Medication management 09/15/2020   Murmur 09/15/2020   Muscle cramps    Osteoarthritis 09/20/2014   Other chest pain 09/15/2020   Pain in both lower extremities 09/15/2020   Palpitations 08/06/2023   Postural lightheadedness 08/18/2023   Precordial pain 09/15/2020   Presence of permanent cardiac  pacemaker    Psoriasis    Snoring 09/15/2020   SOB (shortness of breath) 09/15/2020   Symptomatic bradycardia 09/27/2023   Syncope and collapse 08/06/2023   Vitamin D deficiency 09/20/2014     Assessment: 78yoF s/p femoral to below-knee popliteal artery bypass. Vascular has consulted to bridge therapeutic heparin infusion with plan to discharge home on warfarin once therapeutic since DOAC is unaffordable for patient d/t high deductible.   This morning, heparin level therapeutic (0.48) and CBC okay (hgb 9.8 and plts 140>113).   Goal of Therapy:  INR goal: 2-3 Heparin level 0.3-0.7 units/ml Monitor platelets by anticoagulation protocol: Yes   Plan:  Continue heparin 750 units/h Begin warfarin 5 mg x1  Daily heparin and INR level and CBC  Roslyn Smiling, PharmD PGY1 Pharmacy Resident 01/10/2024 8:46 AM

## 2024-01-10 NOTE — Progress Notes (Signed)
 Occupational Therapy Treatment Patient Details Name: Tina George MRN: [id number] DOB: [birth year] Today's Date: 01/10/2024   History of present illness 78 y.o. female presents to [facility] 01/08/24 for R fem-pop bypass graft and endarterectomy. PMHx: COPD, OA, postural lightheadedness, syncope, bradycardia, PPM   OT comments  Pt. Seen for skilled OT treatment session.  Bed mobility with S.  LB dressing MOD/MAX A for RLE. Pt. Reports family assistance available for LB ADLS PRN.  In room ambulation with sit/stands S.  Cont. With acute OT POC.        If plan is discharge home, recommend the following:  A little help with walking and/or transfers;A little help with bathing/dressing/bathroom;Assistance with cooking/housework;Assist for transportation;Help with stairs or ramp for entrance   Equipment Recommendations  None recommended by OT    Recommendations for Other Services      Precautions / Restrictions Precautions Precautions: Fall Recall of Precautions/Restrictions: Intact       Mobility Bed Mobility Overal bed mobility: Needs Assistance Bed Mobility: Supine to Sit     Supine to sit: HOB elevated, Supervision     General bed mobility comments: hob elevated to home height of stacked pillows at home. no rails, exited on R side like she does at home. no physical assistance required    Transfers Overall transfer level: Needs assistance Equipment used: Rolling walker (2 wheels) Transfers: Sit to/from Stand, Bed to chair/wheelchair/BSC       Step pivot transfers: Contact guard assist     General transfer comment: cues for hand placement, sequencing, and guiding RLE forward prior to sitting down to ease pain of RLE     Balance                                           ADL either performed or assessed with clinical judgement   ADL Overall ADL's : Needs assistance/impaired                     Lower Body Dressing: Sitting/lateral leans;Set  up;Moderate assistance Lower Body Dressing Details (indicate cue type and reason): pt. able to access LLE set up, RLE unable to reach, reports dtr/son available at all times to assist prn Toilet Transfer: Contact guard assist;Rolling walker (2 wheels) Toilet Transfer Details (indicate cue type and reason): simulated with in room ambulation around the bed to the recliner-declined need for actual use                Extremity/Trunk Assessment              Vision       Perception     Praxis     Communication     Cognition Arousal: Alert Behavior During Therapy: WFL for tasks assessed/performed               OT - Cognition Comments: some mild memory deficits                 Following commands: Intact        Cueing   Cueing Techniques: Verbal cues  Exercises      Shoulder Instructions       General Comments      Pertinent Vitals/ Pain       Pain Assessment Pain Assessment: Faces Faces Pain Scale: Hurts even more Pain Location: R groin and thigh-increased when lifting legs in recliner  at end of session-rn present giving pain meds Pain Descriptors / Indicators: Aching, Discomfort Pain Intervention(s): Limited activity within patient's tolerance, Monitored during session, Repositioned, RN gave pain meds during session  Home Living                                          Prior Functioning/Environment              Frequency  Min 2X/week        Progress Toward Goals  OT Goals(current goals can now be found in the care plan section)  Progress towards OT goals: Progressing toward goals     Plan      Co-evaluation                 AM-PAC OT "6 Clicks" Daily Activity     Outcome Measure   Help from another person eating meals?: None Help from another person taking care of personal grooming?: A Little Help from another person toileting, which includes using toliet, bedpan, or urinal?: A Little Help from  another person bathing (including washing, rinsing, drying)?: A Lot Help from another person to put on and taking off regular upper body clothing?: A Little Help from another person to put on and taking off regular lower body clothing?: A Lot 6 Click Score: 17    End of Session Equipment Utilized During Treatment: Gait belt;Rolling walker (2 wheels)  OT Visit Diagnosis: Unsteadiness on feet (R26.81);Other abnormalities of gait and mobility (R26.89);Pain   Activity Tolerance Patient tolerated treatment well   Patient Left in chair;with call bell/phone within reach   Nurse Communication Other (comment);Patient requests pain meds (rn present at end of session, when raising leg portion of recliner it popped at a faster speed than pt. expecting. she made grimacing sound. rn checked and no issues with leg. in room providing pain meds already. no chair alarm needed per rn)        Time: 0981-1914 OT Time Calculation (min): 27 min  Charges: OT General Charges $OT Visit: 1 Visit OT Treatments $Self Care/Home Management : 23-37 mins  Boneta Lucks, COTA/L Acute Rehabilitation [phone]   Alessandra Bevels Lorraine-COTA/L 01/10/2024, 10:07 AM

## 2024-01-10 NOTE — Progress Notes (Addendum)
 [facility]  Subjective  - Doing well with mobility and pain issues.     Objective (!) 102/56 61 98.7 F (37.1 C) (Oral) 20 92%  Intake/Output Summary (Last 24 hours) at 01/10/2024 0830 Last data filed at 01/10/2024 0400 Gross per 24 hour  Intake 124.05 ml  Output --  Net 124.05 ml    Lungs:  non labored Incisions:  R groin and calf incisions without hematoma Extremities:  brisk DP and PT by doppler Neurologic: A&O   Assessment/Planning: 78 y.o. female is s/p R femoral to BK popliteal bypass with PTFE; R CFA endarterectomy    Plan will be to start Coumadin She is unable to afford DOAC so we will start Coumadin per pharmacy and try to arrange INR follow up with her PCP.  She agrees to this.    Cont. Mobility Incisions healing well   Tina George 01/10/2024 8:30 AM --  Laboratory Lab Results: Recent Labs    01/09/24 0940 01/10/24 0410  WBC 13.8* 11.8*  HGB 9.8* 9.8*  HCT 31.5* 30.9*  PLT 140* 113*   BMET Recent Labs    01/08/24 0830 01/09/24 0401  NA 141 140  K 3.4* 3.2*  CL 103 106  CO2 27 25  GLUCOSE 94 136*  BUN 17 21  CREATININE 1.30* 1.57*  CALCIUM 9.2 8.0*    COAG Lab Results  Component Value Date   INR 1.0 01/08/2024   INR SPECIMEN CLOTTED 01/08/2024   No results found for: "PTT"  VASCULAR STAFF ADDENDUM: I have independently interviewed and examined the patient. I agree with the above.  Recovering well, pain tolerance improving.  Incisions healing well, some slight drainage from lower leg incision.  Multiphasic signals distally Eliquis and Xarelto both $500 a month with her insurance this will plan to start Coumadin today.  Daria Pastures MD [facility] Phone Number: [phone] 01/10/2024 11:52 AM

## 2024-01-11 LAB — CBC
HCT: 29.3 % — ABNORMAL LOW (ref 36.0–46.0)
Hemoglobin: 9.3 g/dL — ABNORMAL LOW (ref 12.0–15.0)
MCH: 31.1 pg (ref 26.0–34.0)
MCHC: 31.7 g/dL (ref 30.0–36.0)
MCV: 98 fL (ref 80.0–100.0)
Platelets: 122 10*3/uL — ABNORMAL LOW (ref 150–400)
RBC: 2.99 MIL/uL — ABNORMAL LOW (ref 3.87–5.11)
RDW: 14.9 % (ref 11.5–15.5)
WBC: 10.8 10*3/uL — ABNORMAL HIGH (ref 4.0–10.5)
nRBC: 0 % (ref 0.0–0.2)

## 2024-01-11 LAB — BASIC METABOLIC PANEL WITH GFR
Anion gap: 6 (ref 5–15)
BUN: 22 mg/dL (ref 8–23)
CO2: 26 mmol/L (ref 22–32)
Calcium: 8.1 mg/dL — ABNORMAL LOW (ref 8.9–10.3)
Chloride: 104 mmol/L (ref 98–111)
Creatinine, Ser: 1.5 mg/dL — ABNORMAL HIGH (ref 0.44–1.00)
GFR, Estimated: 36 mL/min — ABNORMAL LOW (ref >60)
Glucose, Bld: 106 mg/dL — ABNORMAL HIGH (ref 70–99)
Potassium: 3.4 mmol/L — ABNORMAL LOW (ref 3.5–5.1)
Sodium: 136 mmol/L (ref 135–145)

## 2024-01-11 LAB — PROTIME-INR
INR: 1 (ref 0.8–1.2)
Prothrombin Time: 13.2 s (ref 11.4–15.2)

## 2024-01-11 LAB — HEPARIN LEVEL (UNFRACTIONATED)
Heparin Unfractionated: <0.1 [IU]/mL — ABNORMAL LOW (ref 0.30–0.70)
Heparin Unfractionated: 0.21 [IU]/mL — ABNORMAL LOW (ref 0.30–0.70)

## 2024-01-11 MED ORDER — WARFARIN SODIUM 5 MG PO TABS
5.0000 mg | ORAL_TABLET | Freq: Once | ORAL | Status: AC
  Administered 2024-01-11: 5 mg via ORAL
  Filled 2024-01-11: qty 1

## 2024-01-11 MED ORDER — METOPROLOL TARTRATE 25 MG PO TABS
25.0000 mg | ORAL_TABLET | Freq: Two times a day (BID) | ORAL | Status: DC
  Administered 2024-01-11 – 2024-01-13 (×4): 25 mg via ORAL
  Filled 2024-01-11 (×4): qty 1

## 2024-01-11 MED ORDER — POTASSIUM CHLORIDE ER 10 MEQ PO TBCR
40.0000 meq | EXTENDED_RELEASE_TABLET | Freq: Every day | ORAL | Status: DC | PRN
  Administered 2024-01-11: 40 meq via ORAL
  Filled 2024-01-11 (×2): qty 4

## 2024-01-11 NOTE — Plan of Care (Signed)

## 2024-01-11 NOTE — Progress Notes (Signed)
 Called PA [place] concerning pt drop in blood pressure and difficulty keeping awake. Pt had been given 2 percocets earlier in morning. Pt had worked with PT but kept her in chair to sleep. Concerned may need to narcan due to lethargy and low bp. Continued to monitor and pt felt better in afternoon and was able to return to bed without issue. Pt resting with call bell within reach.  Will continue to monitor.

## 2024-01-11 NOTE — Progress Notes (Addendum)
 PHARMACY - ANTICOAGULATION CONSULT NOTE  Pharmacy Consult for heparin and warfarin  Indication: therapeutic heparin for PTFE bypass to maintian patenancy   Allergies  Allergen Reactions   Aspirin Itching, Swelling and Other (See Comments)    Tongue swelling   Levaquin [Levofloxacin In D5w] Other (See Comments)    Severe yeast reaction.   Codeine Rash    Patient Measurements: Height: 5\' 7"  (170.2 cm) Weight: 61.7 kg (136 lb) IBW/kg (Calculated) : 61.6 HEPARIN DW (KG): 61.7  Vital Signs: Temp: 98.7 F (37.1 C) (03/30 0402) Temp Source: Oral (03/30 0402) BP: 104/51 (03/30 0402) Pulse Rate: 61 (03/30 0402)  Labs: Recent Labs    01/08/24 0830 01/08/24 0945 01/09/24 0401 01/09/24 0940 01/09/24 1958 01/10/24 0410 01/11/24 0336  HGB 13.2  --  10.0* 9.8*  --  9.8* 9.3*  HCT 41.7  --  31.4* 31.5*  --  30.9* 29.3*  PLT 169  --  136* 140*  --  113* 122*  APTT  --  26  --   --   --   --   --   LABPROT SPECIMEN CLOTTED 13.3  --   --   --   --  13.2  INR SPECIMEN CLOTTED 1.0  --   --   --   --  1.0  HEPARINUNFRC  --   --   --   --  0.43 0.48 <0.10*  CREATININE 1.30*  --  1.57*  --   --   --   --     Estimated Creatinine Clearance: 29.2 mL/min (A) (by C-G formula based on SCr of 1.57 mg/dL (H)).   Medical History: Past Medical History:  Diagnosis Date   Anemia 09/20/2014   Asthma 05/06/2014   Bilateral leg edema 09/15/2020   Bradycardia 09/28/2023   COPD (chronic obstructive pulmonary disease) (HCC)    Daytime somnolence 09/15/2020   Depression    Dyslipidemia    GERD (gastroesophageal reflux disease)    GERD (gastroesophageal reflux disease)    High blood pressure    Hypercholesterolemia 09/20/2014   Hypokalemia    Medication management 09/15/2020   Murmur 09/15/2020   Muscle cramps    Osteoarthritis 09/20/2014   Other chest pain 09/15/2020   Pain in both lower extremities 09/15/2020   Palpitations 08/06/2023   Postural lightheadedness 08/18/2023    Precordial pain 09/15/2020   Presence of permanent cardiac pacemaker    Psoriasis    Snoring 09/15/2020   SOB (shortness of breath) 09/15/2020   Symptomatic bradycardia 09/27/2023   Syncope and collapse 08/06/2023   Vitamin D deficiency 09/20/2014    Assessment: 78yoF s/p femoral to below-knee popliteal artery bypass. Vascular has consulted to bridge therapeutic heparin infusion with plan to discharge home on warfarin once therapeutic since DOAC is unaffordable for patient d/t high deductible.   This morning, heparin level subtherapeutic (<1.10) most liikely d/t line changes from bleeding and it was not resumed after warfarin given. Heparin will be resumed until INR therapeutic.   INR is 1 (subtherapeutic). CBC okay (hgb 9.3 and plts 122 (up from yesterday)).    INR Warfarin Dose  3/29 1 5  mg x1  3/30 1     Goal of Therapy:  INR goal: 2-3 Heparin level 0.3-0.7 units/ml Monitor platelets by anticoagulation protocol: Yes   Plan:  Continue heparin 750 units/h Warfarin 5 mg x1  8 hour heparin level from restart of infusion Daily heparin and INR level and CBC  Roslyn Smiling, PharmD PGY1 Pharmacy Resident  01/11/2024 7:32 AM

## 2024-01-11 NOTE — Progress Notes (Signed)
 PHARMACY - ANTICOAGULATION CONSULT NOTE  Pharmacy Consult for heparin and warfarin  Indication: therapeutic heparin for PTFE bypass to maintian patenancy   Allergies  Allergen Reactions   Aspirin Itching, Swelling and Other (See Comments)    Tongue swelling   Levaquin [Levofloxacin In D5w] Other (See Comments)    Severe yeast reaction.   Codeine Rash    Patient Measurements: Height: 5\' 7"  (170.2 cm) Weight: 61.7 kg (136 lb) IBW/kg (Calculated) : 61.6 HEPARIN DW (KG): 61.7  Vital Signs: Temp: 99.3 F (37.4 C) (03/30 1652) Temp Source: Oral (03/30 1652) BP: 120/49 (03/30 1652) Pulse Rate: 59 (03/30 1152)  Labs: Recent Labs    01/09/24 0401 01/09/24 0940 01/09/24 1958 01/10/24 0410 01/11/24 0336 01/11/24 1255 01/11/24 1806  HGB 10.0* 9.8*  --  9.8* 9.3*  --   --   HCT 31.4* 31.5*  --  30.9* 29.3*  --   --   PLT 136* 140*  --  113* 122*  --   --   LABPROT  --   --   --   --  13.2  --   --   INR  --   --   --   --  1.0  --   --   HEPARINUNFRC  --   --    < > 0.48 <0.10*  --  0.21*  CREATININE 1.57*  --   --   --   --  1.50*  --    < > = values in this interval not displayed.    Estimated Creatinine Clearance: 30.5 mL/min (A) (by C-G formula based on SCr of 1.5 mg/dL (H)).   Assessment: 78yoF s/p femoral to below-knee popliteal artery bypass. Vascular has consulted to bridge therapeutic heparin infusion with plan to discharge home on warfarin once therapeutic since DOAC is unaffordable for patient d/t high deductible.   Heparin level remains subtherapeutic (0.21) on infusion at 750 units/hr. No issues with line or bleeding reported per RN.  Goal of Therapy:  INR goal: 2-3 Heparin level 0.3-0.7 units/ml Monitor platelets by anticoagulation protocol: Yes   Plan:  Increase heparin infusion to 800 units/h F/u 8 hr heparin level  Christoper Fabian, PharmD, BCPS Please see amion for complete clinical pharmacist phone list 01/11/2024 7:32 PM

## 2024-01-11 NOTE — Progress Notes (Signed)
 Patient states that she just does not feel well today. She has been sleeping majority of the day and is asking if she will go home tomorrow. I explained that we would continue monitoring vital signs and she was given potassium supplements. She is aware that we will make sure she is stable when she is ready to be discharged. Pt has requested bedside commode for home as well. Dr. Hetty Blend and PA [facility] aware and medications were adjusted. Pt resting with call bell within reach.  Will continue to monitor.

## 2024-01-11 NOTE — Progress Notes (Addendum)
 [facility] [place]  Subjective  - Doing better day by day   Objective (!) 104/51 61 98.7 F (37.1 C) (Oral) 19 92%  Intake/Output Summary (Last 24 hours) at 01/11/2024 0829 Last data filed at 01/10/2024 2355 Gross per 24 hour  Intake 600 ml  Output 150 ml  Net 450 ml   Incisions:  R groin and calf incisions without hematoma Extremities:  brisk DP and PT by doppler Lungs non labored breathing General no acute distress   Assessment/Planning: 78 y.o. female is s/p R femoral to BK popliteal bypass with PTFE; R CFA endarterectomy    Incisions healing well, doppler signals PT/DP intact  She is unable to afford DOAC so we will start Coumadin per pharmacy and try to arrange INR follow up with her PCP. She agrees to this.   Coumadin started yesterday Plan to set up f/u for INR checks on Monday hopefully with PCP office  [facility] PT recommended will place [facility] order, 3 in 1 and TOC consulted  She told the RN she feels tired and isn't able to stay awake.  We discontinued Morphine and change her Metoprolol from 50 mg BID to 25 BID she is pacer controlled She was mildly hypokalemic I will order a BMET give 40 meq if K+ < 3.4.  3/28 was reported as 3.2.  Tina George 01/11/2024 8:29 AM --  Laboratory Lab Results: Recent Labs    01/10/24 0410 01/11/24 0336  WBC 11.8* 10.8*  HGB 9.8* 9.3*  HCT 30.9* 29.3*  PLT 113* 122*   BMET Recent Labs    01/08/24 0830 01/09/24 0401  NA 141 140  K 3.4* 3.2*  CL 103 106  CO2 27 25  GLUCOSE 94 136*  BUN 17 21  CREATININE 1.30* 1.57*  CALCIUM 9.2 8.0*    COAG Lab Results  Component Value Date   INR 1.0 01/11/2024   INR 1.0 01/08/2024   INR SPECIMEN CLOTTED 01/08/2024   No results found for: "PTT"  VASCULAR STAFF ADDENDUM: I have independently interviewed and examined the patient. I agree with the above.  Feeling fatigued and lightheaded.  Will taper down on pain meds and decrease her  metoprolol dose today as she has been pacer dependent at HR 60 postoperatively but present in the 80s preop  Daria Pastures MD [facility] Phone Number: [phone] 01/11/2024 11:20 AM

## 2024-01-11 NOTE — Progress Notes (Signed)
 Physical Therapy Treatment Patient Details Name: Tina George MRN: [id number] DOB: 1946/05/28 Today's Date: 01/11/2024   History of Present Illness 78 y.o. female presents to [facility] 01/08/24 for R fem-pop bypass graft and endarterectomy. PMHx: COPD, OA, postural lightheadedness, syncope, bradycardia, PPM    PT Comments  Patient relays that she walked in hall twice yesterday and went past nurses' station. However, this morning after 40 ft she reported feeling overly weak and tired and requested return to room. BP attempted while walking, however pt grasping/pushing on RW and not accurate. Prior to ambulation 92/63 (74) and after ambulation 109/54 (70). Patient was unable to complete stair training this morning due to above symptoms. RN made aware. Patient able to verbalize correct technique for up/down steps with painful RLE and states she does not think it will be a problem if she feels better (like yesterday).    If plan is discharge home, recommend the following: A little help with bathing/dressing/bathroom;Assistance with cooking/housework;Assist for transportation;Help with stairs or ramp for entrance;A little help with walking and/or transfers   Can travel by private vehicle        Equipment Recommendations  BSC/3in1    Recommendations for Other Services       Precautions / Restrictions Precautions Precautions: Fall Recall of Precautions/Restrictions: Intact Restrictions Weight Bearing Restrictions Per Provider Order: No     Mobility  Bed Mobility Overal bed mobility: Needs Assistance Bed Mobility: Supine to Sit     Supine to sit: HOB elevated, Modified independent (Device/Increase time)          Transfers Overall transfer level: Needs assistance Equipment used: Rolling walker (2 wheels) Transfers: Sit to/from Stand Sit to Stand: Min assist           General transfer comment: no cues for sequencing with RW, however min assist from EOB due to posterior imbalance;  from [facility] CGA    Ambulation/Gait Ambulation/Gait assistance: Contact guard assist Gait Distance (Feet): 80 Feet Assistive device: Rolling walker (2 wheels) Gait Pattern/deviations: Step-through pattern, Staggering left, Staggering right, Narrow base of support, Antalgic Gait velocity: decr     General Gait Details: pt reported feeling overly weak and tired after 40 ft and requested return to room; remained groggy   Stairs Stairs:  (unable to attempt)           Wheelchair Mobility     Tilt Bed    Modified Rankin (Stroke Patients Only)       Balance Overall balance assessment: Needs assistance, Mild deficits observed, not formally tested Sitting-balance support: No upper extremity supported, Feet supported Sitting balance-Leahy Scale: Good   Postural control: Posterior lean Standing balance support: Bilateral upper extremity supported, During functional activity, Reliant on assistive device for balance Standing balance-Leahy Scale: Poor Standing balance comment: reliant on RW and external support, posterior lean upon standing upright                            Communication Communication Communication: Impaired Factors Affecting Communication: Hearing impaired  Cognition Arousal: Lethargic Behavior During Therapy: WFL for tasks assessed/performed   PT - Cognitive impairments: No apparent impairments                       PT - Cognition Comments: asleep on arrival; groggy throughout session Following commands: Intact      Cueing Cueing Techniques: Verbal cues  Exercises General Exercises - Lower Extremity Ankle Circles/Pumps: AROM, Right, 10  reps Heel Slides: Right, 10 reps, AROM    General Comments General comments (skin integrity, edema, etc.): Noted pt with episodes of hypotension 3/29. BP supine 92/63 (74) after ambulation 109/54 (70)      Pertinent Vitals/Pain Pain Assessment Pain Assessment: 0-10 Pain Score: 6  Pain  Location: R groin and thigh Pain Descriptors / Indicators: Aching, Discomfort Pain Intervention(s): Limited activity within patient's tolerance, Monitored during session, Repositioned    Home Living                          Prior Function            PT Goals (current goals can now be found in the care plan section) Acute Rehab PT Goals Patient Stated Goal: to get stronger Time For Goal Achievement: 01/23/24 Potential to Achieve Goals: Good Progress towards PT goals: Not progressing toward goals - comment (?due to drop in MAP)    Frequency    Min 2X/week      PT Plan      Co-evaluation              AM-PAC PT "6 Clicks" Mobility   Outcome Measure  Help needed turning from your back to your side while in a flat bed without using bedrails?: A Little Help needed moving from lying on your back to sitting on the side of a flat bed without using bedrails?: A Little Help needed moving to and from a bed to a chair (including a wheelchair)?: A Little Help needed standing up from a chair using your arms (e.g., wheelchair or bedside chair)?: A Little Help needed to walk in hospital room?: A Little Help needed climbing 3-5 steps with a railing? : A Lot 6 Click Score: 17    End of Session Equipment Utilized During Treatment: Gait belt Activity Tolerance: Patient limited by fatigue Patient left: with call bell/phone within reach;in chair;with chair alarm set Nurse Communication: Mobility status;Other (comment) (pt feeling overly weak and tired; BPs with some decr in MAP) PT Visit Diagnosis: Unsteadiness on feet (R26.81);Other abnormalities of gait and mobility (R26.89);Muscle weakness (generalized) (M62.81)     Time: 4098-1191 PT Time Calculation (min) (ACUTE ONLY): 32 min  Charges:    $Gait Training: 8-22 mins $Therapeutic Exercise: 8-22 mins PT General Charges $$ ACUTE PT VISIT: 1 Visit                      Tina George, PT Acute Rehabilitation Services   Office [phone]    Zena Amos 01/11/2024, 9:02 AM

## 2024-01-12 LAB — CBC
HCT: 28.5 % — ABNORMAL LOW (ref 36.0–46.0)
Hemoglobin: 9.2 g/dL — ABNORMAL LOW (ref 12.0–15.0)
MCH: 31.9 pg (ref 26.0–34.0)
MCHC: 32.3 g/dL (ref 30.0–36.0)
MCV: 99 fL (ref 80.0–100.0)
Platelets: 119 10*3/uL — ABNORMAL LOW (ref 150–400)
RBC: 2.88 MIL/uL — ABNORMAL LOW (ref 3.87–5.11)
RDW: 14.7 % (ref 11.5–15.5)
WBC: 11.1 10*3/uL — ABNORMAL HIGH (ref 4.0–10.5)
nRBC: 0 % (ref 0.0–0.2)

## 2024-01-12 LAB — HEPARIN LEVEL (UNFRACTIONATED): Heparin Unfractionated: 0.35 [IU]/mL (ref 0.30–0.70)

## 2024-01-12 LAB — PROTIME-INR
INR: 4 — ABNORMAL HIGH (ref 0.8–1.2)
Prothrombin Time: 39.4 s — ABNORMAL HIGH (ref 11.4–15.2)

## 2024-01-12 NOTE — Progress Notes (Signed)
 Mobility Specialist Progress Note:   01/12/24 1211  Mobility  Activity Transferred to/from [facility]  Level of Assistance Minimal assist, patient does 75% or more  Assistive Device Front wheel walker  Distance Ambulated (ft) 10 ft  Activity Response Tolerated well  Mobility Referral Yes  Mobility visit 1 Mobility  Mobility Specialist Start Time (ACUTE ONLY) 1200  Mobility Specialist Stop Time (ACUTE ONLY) 1210  Mobility Specialist Time Calculation (min) (ACUTE ONLY) 10 min   Pt received in bed, requesting assistance to transfer to/from B>BSC. Tolerated well, asx throughout. Void successful. Pt performed peri care independently. Returned pt to bed, all needs met, call bell in reach.    Feliciana Rossetti Mobility Specialist Please contact via Special educational needs teacher or  Rehab office at [phone]

## 2024-01-12 NOTE — Progress Notes (Signed)
 Physical Therapy Treatment Patient Details Name: Tina George MRN: [id number] DOB: May 22, 1946 Today's Date: 01/12/2024   History of Present Illness 78 y.o. female presents to [facility] 01/08/24 for R fem-pop bypass graft and endarterectomy. PMHx: COPD, OA, postural lightheadedness, syncope, bradycardia, PPM    PT Comments  Pt increased gait distance, ambulating ~164ft using RW with supervision. Reviewed technique for going up/down steps given RLE procedure. Pt ascended/descended 4 steps with BUE support and CGA. She demonstrated proper technique with transfer to/from [facility] with supervision. Pt is making steady progress towards her acute care goals. Will continue to follow acutely and advance appropriately.       If plan is discharge home, recommend the following: A little help with bathing/dressing/bathroom;Assistance with cooking/housework;Assist for transportation;Help with stairs or ramp for entrance;A little help with walking and/or transfers   Can travel by private vehicle        Equipment Recommendations  BSC/3in1    Recommendations for Other Services       Precautions / Restrictions Precautions Precautions: Fall Recall of Precautions/Restrictions: Intact Restrictions Weight Bearing Restrictions Per Provider Order: No     Mobility  Bed Mobility Overal bed mobility: Needs Assistance Bed Mobility: Supine to Sit, Sit to Supine     Supine to sit: HOB elevated, Supervision Sit to supine: HOB elevated, Supervision   General bed mobility comments: Pt sat up on R side of bed with VC for sequencing, HOB elevated ~20deg, and increased time. Returned to bed by swinging BLE into bed with increased time and repositioned self in center of bed.    Transfers Overall transfer level: Needs assistance Equipment used: Rolling walker (2 wheels) Transfers: Sit to/from Stand Sit to Stand: Contact guard assist, Supervision   Step pivot transfers: Supervision       General transfer  comment: Pt stood from lowest bed height with CGA to power up. She pushed up with BUE and took increased time to hold onto RW grips and bring chest upright. Good eccentric control with sitting, pt placed RLE slightly infront to decrease knee flex with sitting. Pt transferred to [facility] following walk and from [facility] to bed.    Ambulation/Gait Ambulation/Gait assistance: Contact guard assist, Supervision Gait Distance (Feet): 125 Feet Assistive device: Rolling walker (2 wheels) Gait Pattern/deviations: Step-through pattern, Decreased stride length, Antalgic Gait velocity: reduced Gait velocity interpretation: <1.8 ft/sec, indicate of risk for recurrent falls   General Gait Details: Pt ambulated with a slow, short steps. She accepted weight on RLE and advanced the leg stiffly, but denied pain/tightness with ambulation. Pt maintained body inside RW and navigated obstacles in hallway and room well. Initially CGA, able to lessen to supervision as pt became more confident. No overt LOB or unsteadiness observed.   Stairs Stairs: Yes Stairs assistance: Contact guard assist Stair Management: Two rails, Forwards, Step to pattern Number of Stairs: 4 General stair comments: Pt ascended with LLE and descended with RLE. She turned around on the step easily and transitioned BUE support to opposite rails.   Wheelchair Mobility     Tilt Bed    Modified Rankin (Stroke Patients Only)       Balance Overall balance assessment: Mild deficits observed, not formally tested                                          Communication Communication Communication: Impaired Factors Affecting Communication: Hearing impaired  Cognition Arousal: Alert Behavior During Therapy: WFL for tasks assessed/performed   PT - Cognitive impairments: No apparent impairments                         Following commands: Intact      Cueing Cueing Techniques: Verbal cues  Exercises      General  Comments General comments (skin integrity, edema, etc.): VSS on RA. Encouraged pt to sit up in chair for each meal. Instructed pt she could ask to go on additional walks with nursing/mobility.      Pertinent Vitals/Pain Pain Assessment Pain Assessment: No/denies pain    Home Living                          Prior Function            PT Goals (current goals can now be found in the care plan section) Acute Rehab PT Goals Patient Stated Goal: Return Home Progress towards PT goals: Progressing toward goals    Frequency    Min 2X/week      PT Plan      Co-evaluation              AM-PAC PT "6 Clicks" Mobility   Outcome Measure  Help needed turning from your back to your side while in a flat bed without using bedrails?: A Little Help needed moving from lying on your back to sitting on the side of a flat bed without using bedrails?: A Little Help needed moving to and from a bed to a chair (including a wheelchair)?: A Little Help needed standing up from a chair using your arms (e.g., wheelchair or bedside chair)?: A Little Help needed to walk in hospital room?: A Little Help needed climbing 3-5 steps with a railing? : A Little 6 Click Score: 18    End of Session Equipment Utilized During Treatment: Gait belt Activity Tolerance: Patient tolerated treatment well Patient left: in bed;with call bell/phone within reach Nurse Communication: Mobility status PT Visit Diagnosis: Unsteadiness on feet (R26.81);Other abnormalities of gait and mobility (R26.89);Muscle weakness (generalized) (M62.81)     Time: 8295-6213 PT Time Calculation (min) (ACUTE ONLY): 29 min  Charges:    $Gait Training: 23-37 mins PT General Charges $$ ACUTE PT VISIT: 1 Visit                     Cheri Guppy, PT, DPT Acute Rehabilitation Services Office: [phone] Secure Chat Preferred  Tina George 01/12/2024, 10:39 AM

## 2024-01-12 NOTE — Plan of Care (Signed)
  Problem: Clinical Measurements: Goal: Ability to maintain clinical measurements within normal limits will improve Outcome: Progressing Goal: Will remain free from infection Outcome: Progressing Goal: Diagnostic test results will improve Outcome: Progressing Goal: Respiratory complications will improve Outcome: Progressing Goal: Cardiovascular complication will be avoided Outcome: Progressing   Problem: Clinical Measurements: Goal: Diagnostic test results will improve Outcome: Progressing   Problem: Clinical Measurements: Goal: Respiratory complications will improve Outcome: Progressing

## 2024-01-12 NOTE — Care Management Important Message (Signed)
 Important Message  Patient Details  Name: Tina George MRN: [id number] Date of Birth: Nov 10, 1945   Important Message Given:  Yes - Medicare IM     Dorena Bodo 01/12/2024, 4:14 PM

## 2024-01-12 NOTE — TOC Initial Note (Signed)
 Transition of Care (TOC) - Initial/Assessment Note  Donn Pierini RN, BSN Transitions of Care Unit 4E- RN Case Manager See Treatment Team for direct phone #   Patient Details  Name: Tina George MRN: [id number] Date of Birth: Jul 03, 1946  Transition of Care [facility]) CM/SW Contact:    Darrold Span, RN Phone Number: 01/12/2024, 4:03 PM  Clinical Narrative:                 Orders placed for HHPT and DME- BSC,  CM spoke with pt at bedside, discussed DME and HH needs. Per pt only DME need is a BSC, she has RW and other equipment at home. Voiced her daughter will assist on discharge.   Pt has referral from VVS office to [facility] for [facility] needs- pt voiced she has already spoken with liaison from [facility]. Choice offered for [facility], pt voiced she wants to use [facility]. No preference for DME provider- would like BSC delivered prior to discharge.   Daughter to transport home.   [facility] liaison following for [facility] needs.   DME set up with [employer] for [facility]- will be delivered to room prior to discharge.   Expected Discharge Plan: Home w Home Health Services Barriers to Discharge: Continued Medical Work up   Patient Goals and CMS Choice Patient states their goals for this hospitalization and ongoing recovery are:: return home and get back to baseline CMS Medicare.gov Compare Post Acute Care list provided to:: Patient Choice offered to / list presented to : Patient      Expected Discharge Plan and Services   Discharge Planning Services: CM Consult Post Acute Care Choice: Durable Medical Equipment, Home Health Living arrangements for the past 2 months: Single Family Home                 DME Arranged: Bedside commode DME Agency: [employer] Date DME Agency Contacted: 01/12/24 Time DME Agency Contacted: [phone] Representative spoke with at DME Agency: Vaughan Basta HH Arranged: PT HH Agency: [facility]) Date HH Agency Contacted: 01/12/24   Representative  spoke with at [facility] Agency: Aggie Cosier  Prior Living Arrangements/Services Living arrangements for the past 2 months: Single Family Home Lives with:: Self Patient language and need for interpreter reviewed:: Yes Do you feel safe going back to the place where you live?: Yes      Need for Family Participation in Patient Care: Yes (Comment) Care giver support system in place?: Yes (comment) Current home services: DME Criminal Activity/Legal Involvement Pertinent to Current Situation/Hospitalization: No - Comment as needed  Activities of Daily Living   ADL Screening (condition at time of admission) Independently performs ADLs?: Yes (appropriate for developmental age) Is the patient deaf or have difficulty hearing?: Yes Does the patient have difficulty seeing, even when wearing glasses/contacts?: No Does the patient have difficulty concentrating, remembering, or making decisions?: No  Permission Sought/Granted Permission sought to share information with : Facility Industrial/product designer granted to share information with : Yes, Verbal Permission Granted     Permission granted to share info w AGENCY: HH/DME        Emotional Assessment Appearance:: Appears stated age Attitude/Demeanor/Rapport: Engaged Affect (typically observed): Accepting Orientation: : Oriented to Self, Oriented to Place, Oriented to  Time, Oriented to Situation Alcohol / Substance Use: Not Applicable Psych Involvement: No (comment)  Admission diagnosis:  PAD (peripheral artery disease) (HCC) [I73.9] Critical lower limb ischemia (HCC) [I70.229] Patient Active Problem List   Diagnosis Date Noted   PAD (peripheral  artery disease) (HCC) 01/08/2024   Critical lower limb ischemia (HCC) 01/08/2024   CAD (coronary artery disease), moderate nonobstructive disease CT coronaries August 2024, mildly abnormal CT FFR very distal LAD and OM territory, medically managed 10/20/2023   Sick sinus syndrome (HCC), symptomatic  with bradycardia, s/p permanent pacemaker implant 09/29/2023 10/20/2023   Peripheral vascular disease, unspecified (HCC) 10/20/2023   Bradycardia 09/28/2023   Symptomatic bradycardia 09/27/2023   Postural lightheadedness 08/18/2023   Syncope and collapse 08/06/2023   Palpitations 08/06/2023   Other chest pain 09/15/2020   Bilateral leg edema 09/15/2020   SOB (shortness of breath) 09/15/2020   Daytime somnolence 09/15/2020   Murmur 09/15/2020   Precordial pain 09/15/2020   Medication management 09/15/2020   Pain in both lower extremities 09/15/2020   Snoring 09/15/2020   COPD (chronic obstructive pulmonary disease) (HCC)    Dyslipidemia    GERD (gastroesophageal reflux disease)    High blood pressure    Hypokalemia    Muscle cramps    Psoriasis    Anemia 09/20/2014   Hypercholesterolemia 09/20/2014   Osteoarthritis 09/20/2014   Vitamin D deficiency 09/20/2014   Asthma 05/06/2014   PCP:  Simone Curia, MD Pharmacy:   [facility] [phone] - [place], [place] - [address] [place] [address] Phone: [phone] Fax: [phone]     Social Drivers of Health (SDOH) Social History: SDOH Screenings   Food Insecurity: Patient Declined (01/08/2024)  Housing: Patient Declined (01/08/2024)  Transportation Needs: Patient Declined (01/08/2024)  Utilities: Patient Declined (01/08/2024)  Social Connections: Patient Declined (01/08/2024)  Tobacco Use: High Risk (01/07/2024)   SDOH Interventions:     Readmission Risk Interventions     No data to display

## 2024-01-12 NOTE — Progress Notes (Addendum)
  Progress Note    01/12/2024 7:48 AM 4 Days Post-Op  Subjective:  incision soreness R groin but overall feeling well   Vitals:   01/12/24 0330 01/12/24 0400  BP: 124/63   Pulse: 60   Resp: 20 20  Temp: 98.7 F (37.1 C)   SpO2: 93%    Physical Exam: Lungs:  non labored Incisions:  R groin and popliteal incision c/d/i Extremities:  palpable R DP pulse Neurologic: A&O  CBC    Component Value Date/Time   WBC 10.8 (H) 01/11/2024 0336   RBC 2.99 (L) 01/11/2024 0336   HGB 9.3 (L) 01/11/2024 0336   HGB 13.2 04/22/2023 1445   HCT 29.3 (L) 01/11/2024 0336   HCT 40.0 04/22/2023 1445   PLT 122 (L) 01/11/2024 0336   PLT 224 04/22/2023 1445   MCV 98.0 01/11/2024 0336   MCV 96 04/22/2023 1445   MCH 31.1 01/11/2024 0336   MCHC 31.7 01/11/2024 0336   RDW 14.9 01/11/2024 0336   RDW 13.4 04/22/2023 1445   LYMPHSABS 1.4 12/26/2023 1126   LYMPHSABS 1.4 04/22/2023 1445   MONOABS 0.7 12/26/2023 1126   EOSABS 0.2 12/26/2023 1126   EOSABS 0.1 04/22/2023 1445   BASOSABS 0.0 12/26/2023 1126   BASOSABS 0.0 04/22/2023 1445    BMET    Component Value Date/Time   NA 136 01/11/2024 1255   NA 143 05/26/2023 1307   K 3.4 (L) 01/11/2024 1255   CL 104 01/11/2024 1255   CO2 26 01/11/2024 1255   GLUCOSE 106 (H) 01/11/2024 1255   BUN 22 01/11/2024 1255   BUN 18 05/26/2023 1307   CREATININE 1.50 (H) 01/11/2024 1255   CALCIUM 8.1 (L) 01/11/2024 1255   GFRNONAA 36 (L) 01/11/2024 1255   GFRAA 67 05/05/2019 1753    INR    Component Value Date/Time   INR 1.0 01/11/2024 0336     Intake/Output Summary (Last 24 hours) at 01/12/2024 0748 Last data filed at 01/12/2024 0700 Gross per 24 hour  Intake 94 ml  Output --  Net 94 ml     Assessment/Plan:  78 y.o. female is s/p R CFA endarterectomy with femoral to BK pop with PTFE 4 Days Post-Op   R foot well perfused with palpable DP pulse Incisions are well appearing without hematoma Continue plavix and coumadin; INR not yet drawn  today TOC arranging HH PT Home when INR > 2 and HH arranged    Emilie Rutter, PA-C [facility] [phone] 01/12/2024 7:48 AM  VASCULAR STAFF ADDENDUM: I have independently interviewed and examined the patient. I agree with the above.  Getting close to discharge.  May be ready for DC tomorrow. If she wants to go home tomorrow, we will arrange for a lovenox bridge.  Will need HH. Continue working on mobility.  Rande Brunt. Lenell Antu, MD [facility] Phone Number: [phone] 01/12/2024 10:45 AM

## 2024-01-12 NOTE — Progress Notes (Signed)
 PHARMACY - ANTICOAGULATION CONSULT NOTE  Pharmacy Consult for heparin and warfarin  Indication: therapeutic heparin for PTFE bypass to maintian patenancy   Allergies  Allergen Reactions   Aspirin Itching, Swelling and Other (See Comments)    Tongue swelling   Levaquin [Levofloxacin In D5w] Other (See Comments)    Severe yeast reaction.   Codeine Rash    Patient Measurements: Height: 5\' 7"  (170.2 cm) Weight: 61.7 kg (136 lb) IBW/kg (Calculated) : 61.6 HEPARIN DW (KG): 61.7  Vital Signs: Temp: 98.2 F (36.8 C) (03/31 1150) Temp Source: Oral (03/31 1150) BP: 111/99 (03/31 1150) Pulse Rate: 59 (03/31 1150)  Labs: Recent Labs    01/10/24 0410 01/11/24 0336 01/11/24 1255 01/11/24 1806 01/12/24 1030  HGB 9.8* 9.3*  --   --  9.2*  HCT 30.9* 29.3*  --   --  28.5*  PLT 113* 122*  --   --  119*  LABPROT  --  13.2  --   --  39.4*  INR  --  1.0  --   --  4.0*  HEPARINUNFRC 0.48 <0.10*  --  0.21* 0.35  CREATININE  --   --  1.50*  --   --     Estimated Creatinine Clearance: 30.5 mL/min (A) (by C-G formula based on SCr of 1.5 mg/dL (H)).   Assessment: 78yoF s/p femoral to below-knee popliteal artery bypass. Vascular has consulted to bridge therapeutic heparin infusion with plan to discharge home on warfarin once therapeutic since DOAC is unaffordable for patient d/t high deductible.   Heparin level remains subtherapeutic (0.21) on infusion at 750 units/hr. No issues with line or bleeding reported per RN.  3/31 AM update: HL 0.35 INR 4.0 (verified lab value was correct with lab tech) Hgb 9.2- stable PLT 119K No signs of bleeding or issues w/ gtt   Goal of Therapy:  INR goal: 2-3 Heparin level 0.3-0.7 units/ml Monitor platelets by anticoagulation protocol: Yes   Plan:  No coumadin d/t supratherapeutic INR (4.0) Hold heparin infusion F/u INR in AM Monitor for signs of bleeding   Alixandrea Milleson BS, PharmD, BCPS Clinical Pharmacist 01/12/2024 12:26 PM  Contact:  [phone] after 3 PM  "Be curious, not judgmental..." -Debbora Dus

## 2024-01-13 ENCOUNTER — Other Ambulatory Visit (HOSPITAL_COMMUNITY): Payer: Self-pay

## 2024-01-13 LAB — CBC
HCT: 30.3 % — ABNORMAL LOW (ref 36.0–46.0)
Hemoglobin: 9.6 g/dL — ABNORMAL LOW (ref 12.0–15.0)
MCH: 31.3 pg (ref 26.0–34.0)
MCHC: 31.7 g/dL (ref 30.0–36.0)
MCV: 98.7 fL (ref 80.0–100.0)
Platelets: 136 10*3/uL — ABNORMAL LOW (ref 150–400)
RBC: 3.07 MIL/uL — ABNORMAL LOW (ref 3.87–5.11)
RDW: 14.6 % (ref 11.5–15.5)
WBC: 11.4 10*3/uL — ABNORMAL HIGH (ref 4.0–10.5)
nRBC: 0 % (ref 0.0–0.2)

## 2024-01-13 LAB — PROTIME-INR
INR: 4.4 (ref 0.8–1.2)
Prothrombin Time: 41.9 s — ABNORMAL HIGH (ref 11.4–15.2)

## 2024-01-13 MED ORDER — WARFARIN SODIUM 2.5 MG PO TABS
2.5000 mg | ORAL_TABLET | Freq: Every day | ORAL | 0 refills | Status: DC
  Filled 2024-01-13: qty 30, 30d supply, fill #0

## 2024-01-13 MED ORDER — OXYCODONE-ACETAMINOPHEN 5-325 MG PO TABS
1.0000 | ORAL_TABLET | ORAL | 0 refills | Status: DC | PRN
  Filled 2024-01-13: qty 15, 3d supply, fill #0

## 2024-01-13 MED ORDER — MAGIC MOUTHWASH W/LIDOCAINE
15.0000 mL | Freq: Three times a day (TID) | ORAL | Status: DC | PRN
  Filled 2024-01-13: qty 15

## 2024-01-13 NOTE — Progress Notes (Signed)
 INR 4.4, PA Eveland aware. No new orders at this time.

## 2024-01-13 NOTE — Progress Notes (Signed)
 PHARMACY - ANTICOAGULATION CONSULT NOTE  Pharmacy Consult for heparin and warfarin  Indication: therapeutic heparin for PTFE bypass to maintian patenancy   Allergies  Allergen Reactions   Aspirin Itching, Swelling and Other (See Comments)    Tongue swelling   Levaquin [Levofloxacin In D5w] Other (See Comments)    Severe yeast reaction.   Codeine Rash    Patient Measurements: Height: 5\' 7"  (170.2 cm) Weight: 61.7 kg (136 lb) IBW/kg (Calculated) : 61.6 HEPARIN DW (KG): 61.7  Vital Signs: Temp: 97.9 F (36.6 C) (04/01 0721) Temp Source: Oral (04/01 0721) BP: 117/58 (04/01 0721) Pulse Rate: 62 (04/01 0721)  Labs: Recent Labs    01/11/24 0336 01/11/24 1255 01/11/24 1806 01/12/24 1030 01/13/24 0515 01/13/24 0737  HGB 9.3*  --   --  9.2* 9.6*  --   HCT 29.3*  --   --  28.5* 30.3*  --   PLT 122*  --   --  119* 136*  --   LABPROT 13.2  --   --  39.4*  --  41.9*  INR 1.0  --   --  4.0*  --  4.4*  HEPARINUNFRC <0.10*  --  0.21* 0.35  --   --   CREATININE  --  1.50*  --   --   --   --     Estimated Creatinine Clearance: 30.5 mL/min (A) (by C-G formula based on SCr of 1.5 mg/dL (H)).   Assessment: 78yoF s/p femoral to below-knee popliteal artery bypass. Vascular has consulted to bridge therapeutic heparin infusion with plan to discharge home on warfarin once therapeutic since DOAC is unaffordable for patient d/t high deductible.   Heparin level remains subtherapeutic (0.21) on infusion at 750 units/hr. No issues with line or bleeding reported per RN.  4/1 AM update: INR 4.4  Hgb 9.6- stable PLT 136K No signs of bleeding or issues w/ gtt   Goal of Therapy:  INR 2-3 Monitor platelets by anticoagulation protocol: Yes   Plan:  No coumadin INR daiy Monitor for signs of bleeding   Tina George BS, PharmD, BCPS Clinical Pharmacist 01/13/2024 9:40 AM  Contact: [phone] after 3 PM  "Be curious, not judgmental..." -Debbora Dus

## 2024-01-13 NOTE — Progress Notes (Signed)
 Mobility Specialist Progress Note:    01/13/24 1020  Mobility  Activity  (Declined)   Pt declined mobility at this time d/t fatigue. Pt received asleep in chair. Left with with all needs met, call bell in reach. Will f/u if time permits.   Feliciana Rossetti Mobility Specialist Please contact via Special educational needs teacher or  Rehab office at [phone]

## 2024-01-13 NOTE — Progress Notes (Signed)
 Reviewed AVS, patient expressed understanding of medications, MD follow up reviewed.   Removed IV, Site clean, dry and intact.  Patient states all belongings brought to the hospital at time of admission are accounted for and packed to take home.  Patient informed to pick up medications from [facility] pharmacy. Pt transported with bedside commode to Discharge lounge to wait for transportation home.

## 2024-01-13 NOTE — TOC Transition Note (Signed)
 Transition of Care (TOC) - Discharge Note Donn Pierini RN, BSN Transitions of Care Unit 4E- RN Case Manager See Treatment Team for direct phone #   Patient Details  Name: Tina George MRN: [id number] Date of Birth: October 26, 1945  Transition of Care [facility]) CM/SW Contact:  Darrold Span, RN Phone Number: 01/13/2024, 3:13 PM   Clinical Narrative:    Pt stable for transition home today, HH has been set up with [facility] w/ [facility] office referral- pt agreeable to arrangement and has already spoken with liaison- liaison updated on d/c home today for start of care.   DME- BSC has been delivered to room by [employer].   Referral received this am for INR needs- call made to pt's PCP office [phone])- spoke with [facility]. Confirmed that PCP can follow for coumadin/INR check needs. Appointment for INR check made for 4/4 at 11:30am. Notes faxed to PCP office per request- fax # [phone]. Will also fax d/c summary once available.   Family to transport home.   No further TOC needs noted.    Final next level of care: Home w Home Health Services Barriers to Discharge: Barriers Resolved   Patient Goals and CMS Choice Patient states their goals for this hospitalization and ongoing recovery are:: return home and get back to baseline CMS Medicare.gov Compare Post Acute Care list provided to:: Patient Choice offered to / list presented to : Patient      Discharge Placement             Home w/ [facility]          Discharge Plan and Services Additional resources added to the After Visit Summary for     Discharge Planning Services: Follow-up appt scheduled Post Acute Care Choice: Durable Medical Equipment, Home Health          DME Arranged: Bedside commode DME Agency: [employer] Date DME Agency Contacted: 01/12/24 Time DME Agency Contacted: [phone] Representative spoke with at DME Agency: Vaughan Basta HH Arranged: PT HH Agency: [facility] Date HH Agency  Contacted: 01/12/24   Representative spoke with at [facility] Agency: Aggie Cosier  Social Drivers of Health (SDOH) Interventions SDOH Screenings   Food Insecurity: Patient Declined (01/08/2024)  Housing: Patient Declined (01/08/2024)  Transportation Needs: Patient Declined (01/08/2024)  Utilities: Patient Declined (01/08/2024)  Social Connections: Patient Declined (01/08/2024)  Tobacco Use: High Risk (01/07/2024)     Readmission Risk Interventions    01/13/2024    3:13 PM  Readmission Risk Prevention Plan  Transportation Screening Complete  PCP or Specialist Appt within 3-5 Days Complete  HRI or Home Care Consult Complete  Social Work Consult for Recovery Care Planning/Counseling Complete  Palliative Care Screening Not Applicable  Medication Review Ocean[employer]) Complete

## 2024-01-13 NOTE — Progress Notes (Addendum)
  Progress Note    01/13/2024 7:46 AM 5 Days Post-Op  Subjective:  no complaints   Vitals:   01/13/24 0325 01/13/24 0721  BP: (!) 94/42 (!) 117/58  Pulse: 60 62  Resp: 20 14  Temp: 98 F (36.7 C) 97.9 F (36.6 C)  SpO2: 94% 96%   Physical Exam: Lungs:  non labored Incisions:  R groin and leg incisions c/d/i Extremities:  palpable R DP pulse Neurologic: A&O  CBC    Component Value Date/Time   WBC 11.4 (H) 01/13/2024 0515   RBC 3.07 (L) 01/13/2024 0515   HGB 9.6 (L) 01/13/2024 0515   HGB 13.2 04/22/2023 1445   HCT 30.3 (L) 01/13/2024 0515   HCT 40.0 04/22/2023 1445   PLT 136 (L) 01/13/2024 0515   PLT 224 04/22/2023 1445   MCV 98.7 01/13/2024 0515   MCV 96 04/22/2023 1445   MCH 31.3 01/13/2024 0515   MCHC 31.7 01/13/2024 0515   RDW 14.6 01/13/2024 0515   RDW 13.4 04/22/2023 1445   LYMPHSABS 1.4 12/26/2023 1126   LYMPHSABS 1.4 04/22/2023 1445   MONOABS 0.7 12/26/2023 1126   EOSABS 0.2 12/26/2023 1126   EOSABS 0.1 04/22/2023 1445   BASOSABS 0.0 12/26/2023 1126   BASOSABS 0.0 04/22/2023 1445    BMET    Component Value Date/Time   NA 136 01/11/2024 1255   NA 143 05/26/2023 1307   K 3.4 (L) 01/11/2024 1255   CL 104 01/11/2024 1255   CO2 26 01/11/2024 1255   GLUCOSE 106 (H) 01/11/2024 1255   BUN 22 01/11/2024 1255   BUN 18 05/26/2023 1307   CREATININE 1.50 (H) 01/11/2024 1255   CALCIUM 8.1 (L) 01/11/2024 1255   GFRNONAA 36 (L) 01/11/2024 1255   GFRAA 67 05/05/2019 1753    INR    Component Value Date/Time   INR 4.0 (H) 01/12/2024 1030     Intake/Output Summary (Last 24 hours) at 01/13/2024 0746 Last data filed at 01/12/2024 1605 Gross per 24 hour  Intake --  Output 150 ml  Net -150 ml     Assessment/Plan:  78 y.o. female is s/p  R CFA endarterectomy with femoral to BK pop with PTFE  5 Days Post-Op   R foot well perfused with palpable DP pulse Incisions are healing well Heparin discontinued due to elevated INR;  will ask pharmacy for  recommendation on home dose of coumadin when INR results this morning TOC arranged [facility] Home today after the above   Emilie Rutter, PA-C [facility] [phone] 01/13/2024 7:46 AM  VASCULAR STAFF ADDENDUM: I have independently interviewed and examined the patient. I agree with the above.   Rande Brunt. Lenell Antu, MD [facility] Phone Number: [phone] 01/13/2024 12:04 PM

## 2024-01-14 NOTE — Discharge Summary (Signed)
 Bypass Discharge Summary Patient ID: Tina George [id number] 78 y.o. [birth year]  Admit date: 01/08/2024  Discharge date and time: 01/13/2024  2:44 PM   Admitting Physician: [name], MD   Discharge Physician: same  Admission Diagnoses: PAD (peripheral artery disease) (HCC) [I73.9] Critical lower limb ischemia [facility]) [I70.229]  Discharge Diagnoses: same  Admission Condition: fair  Discharged Condition: fair  Indication for Admission: Postoperative care after bypass surgery  Hospital Course: Tina George is a 78 year old female who underwent right iliofemoral endarterectomy with bovine patch angioplasty and femoral to below the knee popliteal bypass with PTFE by Dr. Lenell Antu on 01/08/2024 due to critical limb ischemia with rest pain of the right foot.  She tolerated the procedure well and was admitted to the hospital postoperatively.  Hospital course was uneventful.  Hospital stay was extended due to affordability of Xarelto and Eliquis and need to start Coumadin.  She received 2 consecutive doses of 5 mg with a sensitive response.  INR quickly elevated to 4.4 despite holding medication for 2 days.  She eventually was discharged on 2.5 mg to start on Wednesday/2/25.  She will follow-up with her PCP on Friday for INR check which was arranged by the transition of care team.  In addition to Coumadin she was also started on Plavix postoperatively.  Home health was also arranged for the patient.  She will follow-up in the office in 2 to 3 weeks for evaluation of her incisions.  At the time of discharge she had a palpable right DP pulse.  She was discharged home in stable condition.  Consults: None  Treatments: surgery: Right common femoral endarterectomy with femoral to below the knee popliteal bypass with PTFE by Dr. Lenell Antu on 01/08/2024    Disposition: Discharge disposition: 01-Home or Self Care       - For [facility] Registry use ---  Post-op:  Wound infection: No  Graft  infection: No  Transfusion: No   New Arrhythmia: No Patency judged by: [ ]  Dopper only, [ ]  Palpable graft pulse, [x ] Palpable distal pulse, [ ]  ABI inc. > 0.15, [ ]  Duplex  Complications: MI: [x ] No, [ ]  Troponin only, [ ]  EKG or Clinical CHF: No Resp failure: [ x] none, [ ]  Pneumonia, [ ]  Ventilator Chg in renal function: [ x] none, [ ]  Inc. Cr > 0.5, [ ]  Temp. Dialysis, [ ]  Permanent dialysis Stroke: [x ] None, [ ]  Minor, [ ]  Major Return to OR: No  Reason for return to OR: [ ]  Bleeding, [ ]  Infection, [ ]  Thrombosis, [ ]  Revision  Discharge medications: Statin use:  Yes ASA use:  Yes Plavix use:  Yes Beta blocker use: Yes Coumadin use: Yes    Patient Instructions:  Allergies as of 01/13/2024       Reactions   Aspirin Itching, Swelling, Other (See Comments)   Tongue swelling   Levaquin [levofloxacin In D5w] Other (See Comments)   Severe yeast reaction.   Codeine Rash        Medication List     STOP taking these medications    traMADol 50 MG tablet Commonly known as: ULTRAM       TAKE these medications    acetaminophen 325 MG tablet Commonly known as: TYLENOL Take 650 mg by mouth every 6 (six) hours as needed for mild pain (pain score 1-3) or moderate pain (pain score 4-6).   albuterol 108 (90 Base) MCG/ACT inhaler Commonly known as: VENTOLIN HFA Inhale 1-2  puffs into the lungs as needed for wheezing or shortness of breath.   ALPRAZolam 0.25 MG tablet Commonly known as: XANAX Take 0.25 mg by mouth every 6 (six) hours as needed for anxiety.   benzonatate 200 MG capsule Commonly known as: TESSALON Take 200 mg by mouth 3 (three) times daily as needed.   Breztri Aerosphere 160-9-4.8 MCG/ACT Aero Generic drug: budeson-glycopyrrolate-formoterol Inhale 2 puffs into the lungs in the morning and at bedtime.   clopidogrel 75 MG tablet Commonly known as: Plavix Take 1 tablet (75 mg total) by mouth daily.   doxycycline 100 MG tablet Commonly known as:  ADOXA Take 100 mg by mouth 2 (two) times daily.   furosemide 40 MG tablet Commonly known as: LASIX Take 40 mg by mouth 2 (two) times daily as needed for fluid.   gabapentin 300 MG capsule Commonly known as: NEURONTIN Take 300 mg by mouth at bedtime.   ibuprofen 200 MG tablet Commonly known as: ADVIL Take 200 mg by mouth every 8 (eight) hours as needed for pain.   ipratropium-albuterol 0.5-2.5 (3) MG/3ML Soln Commonly known as: DUONEB Take 3 mLs by nebulization every 4 (four) hours as needed for wheezing or shortness of breath.   metoprolol tartrate 50 MG tablet Commonly known as: LOPRESSOR Take 1 tablet (50 mg total) by mouth 2 (two) times daily.   multivitamin with minerals Tabs tablet Take 1 tablet by mouth every evening. Multivitamin for Women   nitroGLYCERIN 0.4 MG SL tablet Commonly known as: NITROSTAT Place 1 tablet (0.4 mg total) under the tongue every 5 (five) minutes as needed for chest pain.   oxyCODONE-acetaminophen 5-325 MG tablet Commonly known as: PERCOCET/ROXICET Take 1 tablet by mouth every 4 (four) hours as needed for severe pain (pain score 7-10).   PARoxetine 40 MG tablet Commonly known as: PAXIL Take 40 mg by mouth every morning.   rosuvastatin 40 MG tablet Commonly known as: CRESTOR Take 1 tablet (40 mg total) by mouth daily.   warfarin 2.5 MG tablet Commonly known as: COUMADIN Take 1 tablet (2.5 mg total) by mouth daily.       Activity: activity as tolerated Diet: regular diet Wound Care: keep wound clean and dry  Follow-up with [facility] in 2 weeks.  SignedEmilie Rutter 01/14/2024 12:21 PM

## 2024-01-16 ENCOUNTER — Telehealth: Payer: Self-pay | Admitting: *Deleted

## 2024-01-16 NOTE — Telephone Encounter (Signed)
 Triage call, daughter called . Pt has been talking out of her mind. States she was on the phone with her brother who passes 5 years ago. Pt was on pain medication and has been talking it around the clock however daughter stopped due to pt increase confusion . Pt is able to answer question properly. Pt does have decrease kidney function. Pt encouraged to increase intake of fluids and stop all narcotics. Pt has appointment with PCP today 01/16/24. Pt incisions site are clean and dry. Leg is swollen but no other issues. Daughter encouraged to clean daily with soap and water  and pat dry. If confusion worsens encouraged to call back or call 911/ daughter educated on sighs of stroke.

## 2024-01-26 ENCOUNTER — Other Ambulatory Visit: Payer: Self-pay

## 2024-01-26 DIAGNOSIS — I739 Peripheral vascular disease, unspecified: Secondary | ICD-10-CM

## 2024-02-03 ENCOUNTER — Ambulatory Visit: Payer: Medicare Other | Admitting: Vascular Surgery

## 2024-02-04 ENCOUNTER — Ambulatory Visit (HOSPITAL_COMMUNITY)
Admission: RE | Admit: 2024-02-04 | Discharge: 2024-02-04 | Disposition: A | Discharge status: Home / Self Care | Source: Ambulatory Visit | Attending: Vascular Surgery | Admitting: Vascular Surgery

## 2024-02-04 ENCOUNTER — Ambulatory Visit (HOSPITAL_BASED_OUTPATIENT_CLINIC_OR_DEPARTMENT_OTHER): Admit: 2024-02-04 | Discharge: 2024-02-04 | Discharge status: Home / Self Care | Attending: Vascular Surgery

## 2024-02-04 DIAGNOSIS — I739 Peripheral vascular disease, unspecified: Secondary | ICD-10-CM | POA: Diagnosis not present

## 2024-02-04 LAB — VAS US ABI WITH/WO TBI
Left ABI: 0.66
Right ABI: 1.05

## 2024-02-09 NOTE — Progress Notes (Unsigned)
 POST OPERATIVE OFFICE NOTE    CC:  F/u for surgery  HPI:  This is a 78 y.o. female who is s/p Right t iliofemoral endarterectomy and bovine pericardial patch angioplasty and right common femoral to below-knee popliteal artery bypass with 6 mm externally supported PTFE and subfascial plane 01/08/2024 by Dr. Edgardo Goodwill for rest pain and ulceration  Pt returns today for follow up.  Pt states she is doing very well since surgery.  She states that her leg feels much better.  Her wound is healing.  She does have some swelling that improves with elevation.  She states it gets worse with prolonged standing.  She has some numbness in the right lower leg around the incision and outer leg.   She states that she is down to one cigarette every week or week and a half.  She has a goal of no more smoking next week.  She states that she has used goals to quit smoking.   Pt is on statin, plavix , and coumadin  (for afib).   Allergies  Allergen Reactions   Aspirin  Itching, Swelling and Other (See Comments)    Tongue swelling   Levaquin [Levofloxacin In D5w] Other (See Comments)    Severe yeast reaction.   Codeine Rash    Current Outpatient Medications  Medication Sig Dispense Refill   acetaminophen  (TYLENOL ) 325 MG tablet Take 650 mg by mouth every 6 (six) hours as needed for mild pain (pain score 1-3) or moderate pain (pain score 4-6).     albuterol  (PROVENTIL  HFA;VENTOLIN  HFA) 108 (90 Base) MCG/ACT inhaler Inhale 1-2 puffs into the lungs as needed for wheezing or shortness of breath.     ALPRAZolam  (XANAX ) 0.25 MG tablet Take 0.25 mg by mouth every 6 (six) hours as needed for anxiety.     benzonatate (TESSALON) 200 MG capsule Take 200 mg by mouth 3 (three) times daily as needed.     Budeson-Glycopyrrol-Formoterol (BREZTRI  AEROSPHERE) 160-9-4.8 MCG/ACT AERO Inhale 2 puffs into the lungs in the morning and at bedtime. 42.8 g 0   clopidogrel  (PLAVIX ) 75 MG tablet Take 1 tablet (75 mg total) by mouth daily. 90  tablet 3   doxycycline (ADOXA) 100 MG tablet Take 100 mg by mouth 2 (two) times daily.     furosemide  (LASIX ) 40 MG tablet Take 40 mg by mouth 2 (two) times daily as needed for fluid.     gabapentin  (NEURONTIN ) 300 MG capsule Take 300 mg by mouth at bedtime.     ibuprofen  (ADVIL ,MOTRIN ) 200 MG tablet Take 200 mg by mouth every 8 (eight) hours as needed for pain.     ipratropium-albuterol  (DUONEB) 0.5-2.5 (3) MG/3ML SOLN Take 3 mLs by nebulization every 4 (four) hours as needed for wheezing or shortness of breath.     metoprolol  tartrate (LOPRESSOR ) 50 MG tablet Take 1 tablet (50 mg total) by mouth 2 (two) times daily. 180 tablet 3   Multiple Vitamin (MULTIVITAMIN WITH MINERALS) TABS tablet Take 1 tablet by mouth every evening. Multivitamin for Women     nitroGLYCERIN  (NITROSTAT ) 0.4 MG SL tablet Place 1 tablet (0.4 mg total) under the tongue every 5 (five) minutes as needed for chest pain. 25 tablet 5   oxyCODONE -acetaminophen  (PERCOCET/ROXICET) 5-325 MG tablet Take 1 tablet by mouth every 4 (four) hours as needed for severe pain (pain score 7-10). 15 tablet 0   PARoxetine  (PAXIL ) 40 MG tablet Take 40 mg by mouth every morning.     rosuvastatin  (CRESTOR ) 40 MG tablet Take  1 tablet (40 mg total) by mouth daily. 90 tablet 3   warfarin (COUMADIN ) 2.5 MG tablet Take 1 tablet (2.5 mg total) by mouth daily. 30 tablet 0   No current facility-administered medications for this visit.     ROS:  See HPI  Physical Exam:  Today's Vitals   02/10/24 0843 02/10/24 0845  BP: 127/70   Pulse: 81   Resp: 18   Temp: (!) 97.3 F (36.3 C)   TempSrc: Temporal   SpO2: 95%   Weight: 140 lb (63.5 kg)   Height: 5\' 7"  (1.702 m)   PainSc: 0-No pain 3    Body mass index is 21.93 kg/m.   Incision:  right groin and right below knee incisions look good. Extremities:  palpable right DP pulse    ABI/TBI 02/04/2024 Right:  1.05/0.85 toe pressure 118 Left:  0.66/0.47 toe pressure 66  RLE arterial duplex  02/04/2024 +-----------+--------+-----+---------------+--------+  RIGHT     PSV cm/sRatioStenosis       Waveform  +-----------+--------+-----+---------------+--------+  CFA Distal 203          50-74% stenosisbiphasic  +-----------+--------+-----+---------------+--------+  ATA Distal 70                          biphasic  +-----------+--------+-----+---------------+--------+  PTA Distal 41                          biphasic  +-----------+--------+-----+---------------+--------+  PERO Distal29                          biphasic  +-----------+--------+-----+---------------+--------+     Right Graft #1: Right CFA-BK POP BPG  PSV cm/s Waveform +------------------+---+--------+  Inflow            203biphasic  +------------------+---+--------+  Prox Anastomosis  151biphasic  +------------------+---+--------+  Proximal Graft    68 biphasic  +------------------+---+--------+  Mid Graft         49 biphasic  +------------------+---+--------+  Distal Graft      58 biphasic  +------------------+---+--------+  Distal Anastomosis51 biphasic  +------------------+---+--------+  Outflow          72 biphasic  +------------------+---+--------+     Summary:  Right:  - 50-74% stenosis of the common femoral artery.  - Patent right common femoral to below knee popliteal artery bypass graft.  - Patent 3-vessel runoff at distal segments with biphasic waveforms.    Assessment/Plan:  This is a 78 y.o. female who is s/p: Right t iliofemoral endarterectomy and bovine pericardial patch angioplasty and right common femoral to below-knee popliteal artery bypass with 6 mm externally supported PTFE and subfascial plane 01/08/2024 by Dr. Edgardo Goodwill for rest pain and ulceration   -pt with palpable right DP pulse.  Subjectively pain much improved.   -right ABI improved from pre-op.  On duplex, she did have a mild stenosis in the distal CFA.   Given it is mild  and asymptomatic, will have her follow up in 3 months with RLE arterial duplex and ABI and see Dr. Edgardo Goodwill.  She only has mild symptoms in the left leg.  Encouraged her to continue graduated walking program.  She is having swelling in the right lower leg that is not unexpected.  She does have some mild compression and I encouraged her to wear these during the day putting them on in the morning and taking off at night.  She will also elevate her  legs when she is not walking.   -she does have some numbness around the incision of the lower leg and outer leg most likely from nerve irritation.  Discussed that hopefully with time, this will improve.   -continue plavix , statin and coumadin .  -she has cut down on smoking significantly and only smokes about 1 cigarette every week or week and a half.  She has a goal to not smoke anymore next week.   Praised her for this and encouraged her to continue the good work!   Maryanna Smart, [facility] [phone]   Clinic MD:  Edgardo Goodwill

## 2024-02-10 ENCOUNTER — Encounter: Payer: Self-pay | Admitting: Physician Assistant

## 2024-02-10 ENCOUNTER — Ambulatory Visit: Attending: Vascular Surgery | Admitting: Physician Assistant

## 2024-02-10 VITALS — BP 127/70 | HR 81 | Temp 97.3°F | Resp 18 | Ht 67.0 in | Wt 140.0 lb

## 2024-02-10 DIAGNOSIS — I739 Peripheral vascular disease, unspecified: Secondary | ICD-10-CM

## 2024-02-16 NOTE — Progress Notes (Signed)
 Remote pacemaker transmission.

## 2024-02-16 NOTE — Addendum Note (Signed)
 Addended by: Lott Rouleau A on: 02/16/2024 08:39 AM   Modules accepted: Orders

## 2024-02-19 ENCOUNTER — Other Ambulatory Visit: Payer: Self-pay | Admitting: *Deleted

## 2024-02-19 DIAGNOSIS — I739 Peripheral vascular disease, unspecified: Secondary | ICD-10-CM

## 2024-02-19 DIAGNOSIS — I70213 Atherosclerosis of native arteries of extremities with intermittent claudication, bilateral legs: Secondary | ICD-10-CM

## 2024-03-30 ENCOUNTER — Ambulatory Visit (INDEPENDENT_AMBULATORY_CARE_PROVIDER_SITE_OTHER): Payer: Medicare Other

## 2024-03-30 DIAGNOSIS — I495 Sick sinus syndrome: Secondary | ICD-10-CM

## 2024-03-30 LAB — CUP PACEART REMOTE DEVICE CHECK
Battery Remaining Longevity: 168 mo
Battery Remaining Percentage: 100 %
Brady Statistic RA Percent Paced: 17 %
Brady Statistic RV Percent Paced: 0 %
Date Time Interrogation Session: 20250617030100
Implantable Lead Connection Status: 753985
Implantable Lead Connection Status: 753985
Implantable Lead Implant Date: 20241216
Implantable Lead Implant Date: 20241216
Implantable Lead Location: 753859
Implantable Lead Location: 753860
Implantable Lead Model: 7840
Implantable Lead Model: 7841
Implantable Lead Serial Number: 1122086
Implantable Lead Serial Number: 1532872
Implantable Pulse Generator Implant Date: 20241216
Lead Channel Impedance Value: 482 Ohm
Lead Channel Impedance Value: 712 Ohm
Lead Channel Pacing Threshold Amplitude: 0.7 V
Lead Channel Pacing Threshold Amplitude: 0.9 V
Lead Channel Pacing Threshold Pulse Width: 0.4 ms
Lead Channel Pacing Threshold Pulse Width: 0.4 ms
Lead Channel Setting Pacing Amplitude: 3.5 V
Lead Channel Setting Pacing Amplitude: 3.5 V
Lead Channel Setting Pacing Pulse Width: 0.4 ms
Lead Channel Setting Sensing Sensitivity: 2.5 mV
Pulse Gen Serial Number: 132101
Zone Setting Status: 755011

## 2024-04-01 ENCOUNTER — Ambulatory Visit: Payer: Self-pay | Admitting: Internal Medicine

## 2024-05-25 ENCOUNTER — Encounter (HOSPITAL_COMMUNITY)

## 2024-05-25 ENCOUNTER — Ambulatory Visit: Admitting: Vascular Surgery

## 2024-06-07 NOTE — Progress Notes (Unsigned)
 POST OPERATIVE OFFICE NOTE    CC:  F/u for surgery  HPI:  This is a 78 y.o. female who is s/p Right t iliofemoral endarterectomy and bovine pericardial patch angioplasty and right common femoral to below-knee popliteal artery bypass with 6 mm externally supported PTFE and subfascial plane 01/08/2024 by Dr. Magda for rest pain and ulceration  Pt returns today for follow up.  Pt states she is doing very well since surgery.  She states that her leg feels much better.  Her wound is healing.  She does have some swelling that improves with elevation.  She states it gets worse with prolonged standing.  She has some numbness in the right lower leg around the incision and outer leg.   She states that she is down to one cigarette every week or week and a half.  She has a goal of no more smoking next week.  She states that she has used goals to quit smoking.   Pt is on statin, plavix , and coumadin  (for afib).   Allergies  Allergen Reactions   Aspirin  Itching, Swelling and Other (See Comments)    Tongue swelling   Levaquin [Levofloxacin In D5w] Other (See Comments)    Severe yeast reaction.   Codeine Rash    Current Outpatient Medications  Medication Sig Dispense Refill   acetaminophen  (TYLENOL ) 325 MG tablet Take 650 mg by mouth every 6 (six) hours as needed for mild pain (pain score 1-3) or moderate pain (pain score 4-6).     albuterol  (PROVENTIL  HFA;VENTOLIN  HFA) 108 (90 Base) MCG/ACT inhaler Inhale 1-2 puffs into the lungs as needed for wheezing or shortness of breath.     ALPRAZolam  (XANAX ) 0.25 MG tablet Take 0.25 mg by mouth every 6 (six) hours as needed for anxiety.     benzonatate (TESSALON) 200 MG capsule Take 200 mg by mouth 3 (three) times daily as needed.     Budeson-Glycopyrrol-Formoterol (BREZTRI  AEROSPHERE) 160-9-4.8 MCG/ACT AERO Inhale 2 puffs into the lungs in the morning and at bedtime. 42.8 g 0   clopidogrel  (PLAVIX ) 75 MG tablet Take 1 tablet (75 mg total) by mouth daily. 90  tablet 3   doxycycline (ADOXA) 100 MG tablet Take 100 mg by mouth 2 (two) times daily.     furosemide  (LASIX ) 40 MG tablet Take 40 mg by mouth 2 (two) times daily as needed for fluid. (Patient not taking: Reported on 02/10/2024)     gabapentin  (NEURONTIN ) 300 MG capsule Take 300 mg by mouth at bedtime.     ibuprofen  (ADVIL ,MOTRIN ) 200 MG tablet Take 200 mg by mouth every 8 (eight) hours as needed for pain.     ipratropium-albuterol  (DUONEB) 0.5-2.5 (3) MG/3ML SOLN Take 3 mLs by nebulization every 4 (four) hours as needed for wheezing or shortness of breath.     metoprolol  tartrate (LOPRESSOR ) 50 MG tablet Take 1 tablet (50 mg total) by mouth 2 (two) times daily. 180 tablet 3   Multiple Vitamin (MULTIVITAMIN WITH MINERALS) TABS tablet Take 1 tablet by mouth every evening. Multivitamin for Women     nitroGLYCERIN  (NITROSTAT ) 0.4 MG SL tablet Place 1 tablet (0.4 mg total) under the tongue every 5 (five) minutes as needed for chest pain. 25 tablet 5   oxyCODONE -acetaminophen  (PERCOCET/ROXICET) 5-325 MG tablet Take 1 tablet by mouth every 4 (four) hours as needed for severe pain (pain score 7-10). (Patient not taking: Reported on 02/10/2024) 15 tablet 0   PARoxetine  (PAXIL ) 40 MG tablet Take 40 mg by mouth  every morning.     rosuvastatin  (CRESTOR ) 40 MG tablet Take 1 tablet (40 mg total) by mouth daily. 90 tablet 3   warfarin (COUMADIN ) 2.5 MG tablet Take 1 tablet (2.5 mg total) by mouth daily. 30 tablet 0   No current facility-administered medications for this visit.     ROS:  See HPI  Physical Exam:  There were no vitals filed for this visit.  There is no height or weight on file to calculate BMI.   Incision:  right groin and right below knee incisions look good. Extremities:  palpable right DP pulse    ABI/TBI 02/04/2024 Right:  1.05/0.85 toe pressure 118 Left:  0.66/0.47 toe pressure 66  RLE arterial duplex 02/04/2024 +-----------+--------+-----+---------------+--------+  RIGHT      PSV cm/sRatioStenosis       Waveform  +-----------+--------+-----+---------------+--------+  CFA Distal 203          50-74% stenosisbiphasic  +-----------+--------+-----+---------------+--------+  ATA Distal 70                          biphasic  +-----------+--------+-----+---------------+--------+  PTA Distal 41                          biphasic  +-----------+--------+-----+---------------+--------+  PERO Distal29                          biphasic  +-----------+--------+-----+---------------+--------+     Right Graft #1: Right CFA-BK POP BPG  PSV cm/s Waveform +------------------+---+--------+  Inflow            203biphasic  +------------------+---+--------+  Prox Anastomosis  151biphasic  +------------------+---+--------+  Proximal Graft    68 biphasic  +------------------+---+--------+  Mid Graft         49 biphasic  +------------------+---+--------+  Distal Graft      58 biphasic  +------------------+---+--------+  Distal Anastomosis51 biphasic  +------------------+---+--------+  Outflow          72 biphasic  +------------------+---+--------+     Summary:  Right:  - 50-74% stenosis of the common femoral artery.  - Patent right common femoral to below knee popliteal artery bypass graft.  - Patent 3-vessel runoff at distal segments with biphasic waveforms.    Assessment/Plan:  This is a 78 y.o. female who is s/p: Right t iliofemoral endarterectomy and bovine pericardial patch angioplasty and right common femoral to below-knee popliteal artery bypass with 6 mm externally supported PTFE and subfascial plane 01/08/2024 for rest pain and ulceration  Recommend:  Abstinence from all tobacco products. Blood glucose control with goal A1c < 7%. Blood pressure control with goal blood pressure < 130/80 mmHg. Lipid reduction therapy with goal LDL-C < 55 mg/dL. Clopidogrel  75mg  by mouth daily. Coumadin  Atorvastatin 40-80mg  PO  QD (or other high intensity statin therapy).  Follow up with me in 6 months. Will continue to monitor CFA stenosis seen on duplex which has not changed.   [name]. Magda, MD [facility] Phone Number: [phone] 06/08/2024 1:50 PM

## 2024-06-08 ENCOUNTER — Ambulatory Visit (HOSPITAL_BASED_OUTPATIENT_CLINIC_OR_DEPARTMENT_OTHER)
Admission: RE | Admit: 2024-06-08 | Discharge: 2024-06-08 | Disposition: A | Discharge status: Home / Self Care | Source: Ambulatory Visit | Attending: Physician Assistant | Admitting: Physician Assistant

## 2024-06-08 ENCOUNTER — Encounter: Payer: Self-pay | Admitting: Vascular Surgery

## 2024-06-08 ENCOUNTER — Ambulatory Visit: Admitting: Vascular Surgery

## 2024-06-08 ENCOUNTER — Ambulatory Visit (HOSPITAL_COMMUNITY)
Admission: RE | Admit: 2024-06-08 | Discharge: 2024-06-08 | Disposition: A | Discharge status: Home / Self Care | Source: Ambulatory Visit | Attending: Physician Assistant | Admitting: Physician Assistant

## 2024-06-08 VITALS — BP 184/91 | HR 72 | Temp 97.7°F | Ht 67.0 in | Wt 144.0 lb

## 2024-06-08 DIAGNOSIS — I739 Peripheral vascular disease, unspecified: Secondary | ICD-10-CM | POA: Insufficient documentation

## 2024-06-08 DIAGNOSIS — I70213 Atherosclerosis of native arteries of extremities with intermittent claudication, bilateral legs: Secondary | ICD-10-CM

## 2024-06-08 LAB — VAS US ABI WITH/WO TBI
Left ABI: 0.61
Right ABI: 1.03

## 2024-06-09 ENCOUNTER — Other Ambulatory Visit: Payer: Self-pay

## 2024-06-09 DIAGNOSIS — I70213 Atherosclerosis of native arteries of extremities with intermittent claudication, bilateral legs: Secondary | ICD-10-CM

## 2024-06-10 NOTE — Progress Notes (Signed)
 Remote pacemaker transmission.

## 2024-06-29 ENCOUNTER — Ambulatory Visit (INDEPENDENT_AMBULATORY_CARE_PROVIDER_SITE_OTHER): Payer: Medicare Other

## 2024-06-29 DIAGNOSIS — I495 Sick sinus syndrome: Secondary | ICD-10-CM

## 2024-06-30 LAB — CUP PACEART REMOTE DEVICE CHECK
Battery Remaining Longevity: 162 mo
Battery Remaining Percentage: 100 %
Brady Statistic RA Percent Paced: 28 %
Brady Statistic RV Percent Paced: 0 %
Date Time Interrogation Session: 20250916112000
Implantable Lead Connection Status: 753985
Implantable Lead Connection Status: 753985
Implantable Lead Implant Date: 20241216
Implantable Lead Implant Date: 20241216
Implantable Lead Location: 753859
Implantable Lead Location: 753860
Implantable Lead Model: 7840
Implantable Lead Model: 7841
Implantable Lead Serial Number: 1122086
Implantable Lead Serial Number: 1532872
Implantable Pulse Generator Implant Date: 20241216
Lead Channel Impedance Value: 545 Ohm
Lead Channel Impedance Value: 797 Ohm
Lead Channel Pacing Threshold Amplitude: 0.7 V
Lead Channel Pacing Threshold Amplitude: 0.8 V
Lead Channel Pacing Threshold Pulse Width: 0.4 ms
Lead Channel Pacing Threshold Pulse Width: 0.4 ms
Lead Channel Setting Pacing Amplitude: 3.5 V
Lead Channel Setting Pacing Amplitude: 3.5 V
Lead Channel Setting Pacing Pulse Width: 0.4 ms
Lead Channel Setting Sensing Sensitivity: 2.5 mV
Pulse Gen Serial Number: 132101
Zone Setting Status: 755011

## 2024-07-02 ENCOUNTER — Ambulatory Visit: Payer: Self-pay | Admitting: Internal Medicine

## 2024-07-05 NOTE — Progress Notes (Signed)
 Remote PPM Transmission

## 2024-07-19 ENCOUNTER — Telehealth: Payer: Self-pay

## 2024-07-19 ENCOUNTER — Other Ambulatory Visit: Payer: Self-pay | Admitting: Vascular Surgery

## 2024-07-19 DIAGNOSIS — I739 Peripheral vascular disease, unspecified: Secondary | ICD-10-CM

## 2024-07-19 NOTE — Progress Notes (Unsigned)
 POST OPERATIVE OFFICE NOTE    CC:  F/u for surgery  HPI:  This is a 78 y.o. female who is s/p Right t iliofemoral endarterectomy and bovine pericardial patch angioplasty and right common femoral to below-knee popliteal artery bypass with 6 mm externally supported PTFE and subfascial plane 01/08/2024 by Dr. Magda for rest pain and ulceration  Pt returns today for follow up.  Pt states she is doing very well since surgery.  She states that her leg feels much better.  Her wound is healing.  She does have some swelling that improves with elevation.  She states it gets worse with prolonged standing.  She has some numbness in the right lower leg around the incision and outer leg.   She states that she is down to one cigarette every week or week and a half.  She has a goal of no more smoking next week.  She states that she has used goals to quit smoking.   Pt is on statin, plavix , and coumadin  (for afib).   06/09/24: Patient returns to clinic for surveillance of peripheral arterial disease status post right lower extremity bypass.  She is doing very well overall.  She does report some cramping at night which is bothersome to her.  She does not have the typical cramping pain with walking seen and claudication.  She has no ischemic rest pain.  She has no ulcers about her feet.  She does notice more discomfort in her left leg, when compared to her right.  We reviewed her noninvasive testing in detail.  Allergies  Allergen Reactions   Aspirin  Itching, Swelling and Other (See Comments)    Tongue swelling   Levaquin [Levofloxacin In D5w] Other (See Comments)    Severe yeast reaction.   Codeine Rash    Current Outpatient Medications  Medication Sig Dispense Refill   acetaminophen  (TYLENOL ) 325 MG tablet Take 650 mg by mouth every 6 (six) hours as needed for mild pain (pain score 1-3) or moderate pain (pain score 4-6).     albuterol  (PROVENTIL  HFA;VENTOLIN  HFA) 108 (90 Base) MCG/ACT inhaler Inhale 1-2  puffs into the lungs as needed for wheezing or shortness of breath.     ALPRAZolam  (XANAX ) 0.25 MG tablet Take 0.25 mg by mouth every 6 (six) hours as needed for anxiety.     benzonatate (TESSALON) 200 MG capsule Take 200 mg by mouth 3 (three) times daily as needed.     Budeson-Glycopyrrol-Formoterol (BREZTRI  AEROSPHERE) 160-9-4.8 MCG/ACT AERO Inhale 2 puffs into the lungs in the morning and at bedtime. 42.8 g 0   clopidogrel  (PLAVIX ) 75 MG tablet Take 1 tablet (75 mg total) by mouth daily. 90 tablet 3   doxycycline (ADOXA) 100 MG tablet Take 100 mg by mouth 2 (two) times daily.     furosemide  (LASIX ) 40 MG tablet Take 40 mg by mouth 2 (two) times daily as needed for fluid. (Patient not taking: Reported on 02/10/2024)     gabapentin  (NEURONTIN ) 300 MG capsule Take 300 mg by mouth at bedtime.     ibuprofen  (ADVIL ,MOTRIN ) 200 MG tablet Take 200 mg by mouth every 8 (eight) hours as needed for pain.     ipratropium-albuterol  (DUONEB) 0.5-2.5 (3) MG/3ML SOLN Take 3 mLs by nebulization every 4 (four) hours as needed for wheezing or shortness of breath.     metoprolol  tartrate (LOPRESSOR ) 50 MG tablet Take 1 tablet (50 mg total) by mouth 2 (two) times daily. 180 tablet 3   Multiple Vitamin (MULTIVITAMIN  WITH MINERALS) TABS tablet Take 1 tablet by mouth every evening. Multivitamin for Women     nitroGLYCERIN  (NITROSTAT ) 0.4 MG SL tablet Place 1 tablet (0.4 mg total) under the tongue every 5 (five) minutes as needed for chest pain. 25 tablet 5   oxyCODONE -acetaminophen  (PERCOCET/ROXICET) 5-325 MG tablet Take 1 tablet by mouth every 4 (four) hours as needed for severe pain (pain score 7-10). (Patient not taking: Reported on 02/10/2024) 15 tablet 0   PARoxetine  (PAXIL ) 40 MG tablet Take 40 mg by mouth every morning.     rosuvastatin  (CRESTOR ) 40 MG tablet Take 1 tablet (40 mg total) by mouth daily. 90 tablet 3   warfarin (COUMADIN ) 2.5 MG tablet Take 1 tablet (2.5 mg total) by mouth daily. 30 tablet 0   No  current facility-administered medications for this visit.     ROS:  See HPI  Physical Exam:  There were no vitals filed for this visit.   There is no height or weight on file to calculate BMI.  No acute distress Regular rate and rhythm Unlabored breathing Well-healed incisions from prior bypass in the right leg Dependent rubor in bilateral lower extremities Feet are warm and well-perfused  +-------+-----------+-----------+------------+------------+  ABI/TBIToday's ABIToday's TBIPrevious ABIPrevious TBI  +-------+-----------+-----------+------------+------------+  Right 1.03       0.71       1.05        0.85          +-------+-----------+-----------+------------+------------+  Left  0.61       0.31       0.66        0.47          +-------+-----------+-----------+------------+------------+   Right lower extremity bypass duplex: Patent bypass with continued 50-74% inflow stenosis (low end of  range).     Assessment/Plan:  This is a 78 y.o. female who is s/p: Right t iliofemoral endarterectomy and bovine pericardial patch angioplasty and right common femoral to below-knee popliteal artery bypass with 6 mm externally supported PTFE and subfascial plane 01/08/2024 for rest pain and ulceration  Recommend:  Abstinence from all tobacco products. Blood glucose control with goal A1c < 7%. Blood pressure control with goal blood pressure < 130/80 mmHg. Lipid reduction therapy with goal LDL-C < 55 mg/dL. Clopidogrel  75mg  by mouth daily. Coumadin  Atorvastatin 40-80mg  PO QD (or other high intensity statin therapy).  Follow up with me in 6 months. Will continue to monitor CFA stenosis seen on duplex which has not changed.   [name]. Magda, MD [facility] Phone Number: [phone] 07/19/2024 4:44 PM

## 2024-07-19 NOTE — Telephone Encounter (Signed)
 Patient called reporting left toes and foot are turning purple and black, rest pain in left foot and increased cramping in left calf. Patient also reporting increased swelling.

## 2024-07-20 ENCOUNTER — Other Ambulatory Visit: Payer: Self-pay

## 2024-07-20 ENCOUNTER — Ambulatory Visit (INDEPENDENT_AMBULATORY_CARE_PROVIDER_SITE_OTHER): Admitting: Vascular Surgery

## 2024-07-20 ENCOUNTER — Encounter: Payer: Self-pay | Admitting: Vascular Surgery

## 2024-07-20 ENCOUNTER — Ambulatory Visit (HOSPITAL_COMMUNITY)
Admission: RE | Admit: 2024-07-20 | Discharge: 2024-07-20 | Disposition: A | Discharge status: Home / Self Care | Source: Ambulatory Visit | Attending: Vascular Surgery | Admitting: Vascular Surgery

## 2024-07-20 VITALS — BP 179/94 | HR 94 | Temp 97.8°F | Ht 67.0 in | Wt 144.0 lb

## 2024-07-20 DIAGNOSIS — I70222 Atherosclerosis of native arteries of extremities with rest pain, left leg: Secondary | ICD-10-CM | POA: Insufficient documentation

## 2024-07-20 DIAGNOSIS — I739 Peripheral vascular disease, unspecified: Secondary | ICD-10-CM | POA: Insufficient documentation

## 2024-07-20 DIAGNOSIS — I70213 Atherosclerosis of native arteries of extremities with intermittent claudication, bilateral legs: Secondary | ICD-10-CM

## 2024-07-20 LAB — VAS US ABI WITH/WO TBI
Left ABI: 0.59
Right ABI: 0.98

## 2024-07-28 ENCOUNTER — Other Ambulatory Visit: Payer: Self-pay

## 2024-07-28 DIAGNOSIS — Z950 Presence of cardiac pacemaker: Secondary | ICD-10-CM | POA: Insufficient documentation

## 2024-07-29 ENCOUNTER — Other Ambulatory Visit: Payer: Self-pay

## 2024-07-29 ENCOUNTER — Emergency Department (HOSPITAL_COMMUNITY)

## 2024-07-29 ENCOUNTER — Encounter (HOSPITAL_COMMUNITY): Payer: Self-pay

## 2024-07-29 ENCOUNTER — Inpatient Hospital Stay (HOSPITAL_COMMUNITY)
Admission: EM | Admit: 2024-07-29 | Discharge: 2024-07-31 | DRG: 322 | Disposition: A | Discharge status: Home / Self Care

## 2024-07-29 ENCOUNTER — Ambulatory Visit

## 2024-07-29 VITALS — BP 120/64 | HR 94 | Ht 66.0 in | Wt 144.4 lb

## 2024-07-29 DIAGNOSIS — I739 Peripheral vascular disease, unspecified: Secondary | ICD-10-CM | POA: Diagnosis present

## 2024-07-29 DIAGNOSIS — Z7902 Long term (current) use of antithrombotics/antiplatelets: Secondary | ICD-10-CM | POA: Diagnosis not present

## 2024-07-29 DIAGNOSIS — Z885 Allergy status to narcotic agent status: Secondary | ICD-10-CM | POA: Diagnosis not present

## 2024-07-29 DIAGNOSIS — E7800 Pure hypercholesterolemia, unspecified: Secondary | ICD-10-CM | POA: Diagnosis present

## 2024-07-29 DIAGNOSIS — I70213 Atherosclerosis of native arteries of extremities with intermittent claudication, bilateral legs: Secondary | ICD-10-CM

## 2024-07-29 DIAGNOSIS — I2510 Atherosclerotic heart disease of native coronary artery without angina pectoris: Secondary | ICD-10-CM | POA: Diagnosis not present

## 2024-07-29 DIAGNOSIS — N183 Chronic kidney disease, stage 3 unspecified: Secondary | ICD-10-CM | POA: Diagnosis present

## 2024-07-29 DIAGNOSIS — F17210 Nicotine dependence, cigarettes, uncomplicated: Secondary | ICD-10-CM | POA: Diagnosis present

## 2024-07-29 DIAGNOSIS — I495 Sick sinus syndrome: Secondary | ICD-10-CM | POA: Diagnosis present

## 2024-07-29 DIAGNOSIS — I25110 Atherosclerotic heart disease of native coronary artery with unstable angina pectoris: Principal | ICD-10-CM | POA: Diagnosis present

## 2024-07-29 DIAGNOSIS — J4489 Other specified chronic obstructive pulmonary disease: Secondary | ICD-10-CM | POA: Diagnosis present

## 2024-07-29 DIAGNOSIS — Z9841 Cataract extraction status, right eye: Secondary | ICD-10-CM | POA: Diagnosis not present

## 2024-07-29 DIAGNOSIS — Z7951 Long term (current) use of inhaled steroids: Secondary | ICD-10-CM

## 2024-07-29 DIAGNOSIS — Z881 Allergy status to other antibiotic agents status: Secondary | ICD-10-CM

## 2024-07-29 DIAGNOSIS — I200 Unstable angina: Principal | ICD-10-CM | POA: Diagnosis present

## 2024-07-29 DIAGNOSIS — Z79899 Other long term (current) drug therapy: Secondary | ICD-10-CM

## 2024-07-29 DIAGNOSIS — Z886 Allergy status to analgesic agent status: Secondary | ICD-10-CM

## 2024-07-29 DIAGNOSIS — E785 Hyperlipidemia, unspecified: Secondary | ICD-10-CM | POA: Diagnosis present

## 2024-07-29 DIAGNOSIS — Z9049 Acquired absence of other specified parts of digestive tract: Secondary | ICD-10-CM | POA: Diagnosis not present

## 2024-07-29 DIAGNOSIS — Z8249 Family history of ischemic heart disease and other diseases of the circulatory system: Secondary | ICD-10-CM | POA: Diagnosis not present

## 2024-07-29 DIAGNOSIS — Z800 Family history of malignant neoplasm of digestive organs: Secondary | ICD-10-CM

## 2024-07-29 DIAGNOSIS — Z9842 Cataract extraction status, left eye: Secondary | ICD-10-CM

## 2024-07-29 DIAGNOSIS — I129 Hypertensive chronic kidney disease with stage 1 through stage 4 chronic kidney disease, or unspecified chronic kidney disease: Secondary | ICD-10-CM | POA: Diagnosis present

## 2024-07-29 DIAGNOSIS — Z7901 Long term (current) use of anticoagulants: Secondary | ICD-10-CM

## 2024-07-29 DIAGNOSIS — Z950 Presence of cardiac pacemaker: Secondary | ICD-10-CM

## 2024-07-29 DIAGNOSIS — Z955 Presence of coronary angioplasty implant and graft: Secondary | ICD-10-CM

## 2024-07-29 DIAGNOSIS — R079 Chest pain, unspecified: Secondary | ICD-10-CM | POA: Diagnosis not present

## 2024-07-29 DIAGNOSIS — I471 Supraventricular tachycardia, unspecified: Secondary | ICD-10-CM | POA: Diagnosis present

## 2024-07-29 DIAGNOSIS — Z90710 Acquired absence of both cervix and uterus: Secondary | ICD-10-CM | POA: Diagnosis not present

## 2024-07-29 DIAGNOSIS — I10 Essential (primary) hypertension: Secondary | ICD-10-CM | POA: Diagnosis present

## 2024-07-29 LAB — CBC
HCT: 36.2 % (ref 36.0–46.0)
Hemoglobin: 11.5 g/dL — ABNORMAL LOW (ref 12.0–15.0)
MCH: 31.5 pg (ref 26.0–34.0)
MCHC: 31.8 g/dL (ref 30.0–36.0)
MCV: 99.2 fL (ref 80.0–100.0)
Platelets: 151 K/uL (ref 150–400)
RBC: 3.65 MIL/uL — ABNORMAL LOW (ref 3.87–5.11)
RDW: 15.1 % (ref 11.5–15.5)
WBC: 6.6 K/uL (ref 4.0–10.5)
nRBC: 0 % (ref 0.0–0.2)

## 2024-07-29 LAB — TROPONIN I (HIGH SENSITIVITY)
Troponin I (High Sensitivity): 15 ng/L (ref <18)
Troponin I (High Sensitivity): 15 ng/L (ref <18)

## 2024-07-29 LAB — BASIC METABOLIC PANEL WITH GFR
Anion gap: 8 (ref 5–15)
BUN: 11 mg/dL (ref 8–23)
CO2: 24 mmol/L (ref 22–32)
Calcium: 8.9 mg/dL (ref 8.9–10.3)
Chloride: 108 mmol/L (ref 98–111)
Creatinine, Ser: 1.21 mg/dL — ABNORMAL HIGH (ref 0.44–1.00)
GFR, Estimated: 46 mL/min — ABNORMAL LOW (ref >60)
Glucose, Bld: 106 mg/dL — ABNORMAL HIGH (ref 70–99)
Potassium: 3.6 mmol/L (ref 3.5–5.1)
Sodium: 140 mmol/L (ref 135–145)

## 2024-07-29 LAB — PROTIME-INR
INR: 0.9 (ref 0.8–1.2)
Prothrombin Time: 12.9 s (ref 11.4–15.2)

## 2024-07-29 MED ORDER — ACETAMINOPHEN 325 MG PO TABS
650.0000 mg | ORAL_TABLET | Freq: Once | ORAL | Status: AC
  Administered 2024-07-29: 650 mg via ORAL
  Filled 2024-07-29: qty 2

## 2024-07-29 MED ORDER — ASPIRIN 300 MG RE SUPP: 300.0000 mg | RECTAL | Status: DC

## 2024-07-29 MED ORDER — ONDANSETRON HCL 4 MG/2ML IJ SOLN: 4.0000 mg | Freq: Four times a day (QID) | INTRAMUSCULAR | Status: DC | PRN

## 2024-07-29 MED ORDER — NITROGLYCERIN 0.4 MG SL SUBL: 0.4000 mg | SUBLINGUAL_TABLET | SUBLINGUAL | Status: DC | PRN

## 2024-07-29 MED ORDER — HEPARIN BOLUS VIA INFUSION
3800.0000 [IU] | Freq: Once | INTRAVENOUS | Status: AC
  Administered 2024-07-29: 3800 [IU] via INTRAVENOUS
  Filled 2024-07-29: qty 3800

## 2024-07-29 MED ORDER — ACETAMINOPHEN 325 MG PO TABS
650.0000 mg | ORAL_TABLET | ORAL | Status: DC | PRN
  Administered 2024-07-30 – 2024-07-31 (×2): 650 mg via ORAL
  Filled 2024-07-29 (×2): qty 2

## 2024-07-29 MED ORDER — HYDRALAZINE HCL 20 MG/ML IJ SOLN
10.0000 mg | Freq: Once | INTRAMUSCULAR | Status: DC
  Filled 2024-07-29: qty 1

## 2024-07-29 MED ORDER — HEPARIN (PORCINE) 25000 UT/250ML-% IV SOLN
850.0000 [IU]/h | INTRAVENOUS | Status: DC
  Administered 2024-07-29: 800 [IU]/h via INTRAVENOUS
  Filled 2024-07-29: qty 250

## 2024-07-29 MED ORDER — ASPIRIN 81 MG PO CHEW: 324.0000 mg | CHEWABLE_TABLET | ORAL | Status: DC

## 2024-07-29 NOTE — ED Provider Notes (Signed)
 [place] EMERGENCY DEPARTMENT AT [facility] Provider Note   CSN: [id number] Arrival date & time: 07/29/24  1539     Patient presents with: Chest Pain   Tina George is a 78 y.o. 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  Continues to have some ongoing chest tightness.  She felt some heart racing and pain rating into her left arm last night.  Pacemaker interrogated by cardiology and showed runs of SVT.  Received a call from Dr. Liborio prior to patient's arrival, patient sent to the ED due to concern for unstable angina, with plan for probable cardiac admission.    Chest Pain      Prior to Admission medications   Medication Sig Start Date End Date Taking? Authorizing Provider  acetaminophen  (TYLENOL ) 325 MG tablet Take 650 mg by mouth every 6 (six) hours as needed for mild pain (pain score 1-3) or moderate pain (pain score 4-6).    [provider]  albuterol  (PROVENTIL  HFA;VENTOLIN  HFA) 108 (90 Base) MCG/ACT inhaler Inhale 1-2 puffs into the lungs as needed for wheezing or shortness of breath.    [provider]  ALPRAZolam  (XANAX ) 0.25 MG tablet Take 0.25 mg by mouth every 6 (six) hours as needed for anxiety. 07/04/20   [provider]  amLODipine (NORVASC) 5 MG tablet Take 5 mg by mouth daily. 06/05/24   [provider]  benzonatate (TESSALON) 200 MG capsule Take 200 mg by mouth 3 (three) times daily as needed. 12/18/23   [provider]  Budeson-Glycopyrrol-Formoterol (BREZTRI  AEROSPHERE) 160-9-4.8 MCG/ACT AERO Inhale 2 puffs into the lungs in the morning and at bedtime. 12/29/20   Kara Dorn NOVAK, MD  clopidogrel  (PLAVIX ) 75 MG tablet Take 1 tablet (75 mg total) by mouth daily. 10/20/23   Madireddy, Alean SAUNDERS, MD  cyclobenzaprine (FLEXERIL) 10 MG  tablet Take 10 mg by mouth 2 (two) times daily as needed. 06/07/24   [provider]  doxycycline (ADOXA) 100 MG tablet Take 100 mg by mouth 2 (two) times daily.    [provider]  furosemide  (LASIX ) 40 MG tablet Take 40 mg by mouth 2 (two) times daily as needed for fluid.    [provider]  gabapentin  (NEURONTIN ) 300 MG capsule Take 300 mg by mouth at bedtime. 05/08/23   [provider]  ibuprofen  (ADVIL ,MOTRIN ) 200 MG tablet Take 200 mg by mouth every 8 (eight) hours as needed for pain.    [provider]  ipratropium-albuterol  (DUONEB) 0.5-2.5 (3) MG/3ML SOLN Take 3 mLs by nebulization every 4 (four) hours as needed for wheezing or shortness of breath. 08/25/20   [provider]  metoprolol  tartrate (LOPRESSOR ) 50 MG tablet Take 1 tablet (50 mg total) by mouth 2 (two) times daily. 12/24/23   Krasowski, Robert J, MD  Multiple Vitamin (MULTIVITAMIN WITH MINERALS) TABS tablet Take 1 tablet by mouth every evening. Multivitamin for Women    [provider]  nitroGLYCERIN  (NITROSTAT ) 0.4 MG SL tablet Place 1 tablet (0.4 mg total) under the tongue every 5 (five) minutes as needed for chest pain. 05/30/23   Carlin Delon BROCKS, NP  oxyCODONE -acetaminophen  (PERCOCET/ROXICET) 5-325 MG tablet Take 1 tablet by mouth every 4 (four) hours as needed for severe pain (pain score 7-10). 01/13/24   Bethanie Cough, PA-C  PARoxetine  (PAXIL ) 40 MG tablet Take 40 mg by  mouth every morning. 11/17/20   [provider]  rosuvastatin  (CRESTOR ) 40 MG tablet Take 1 tablet (40 mg total) by mouth daily. 10/20/23   Madireddy, Alean SAUNDERS, MD  warfarin (COUMADIN ) 2.5 MG tablet Take 1 tablet (2.5 mg total) by mouth daily. 01/14/24   Bethanie Cough, PA-C    Allergies: Aspirin , Levaquin [levofloxacin in d5w], and Codeine    Review of Systems  Cardiovascular:  Positive for chest pain.  All other systems reviewed and are negative.   Updated Vital Signs BP (!) 200/84  (BP Location: Right Arm)   Pulse 82   Temp (!) 97.4 F (36.3 C) (Oral)   Resp (!) 24   SpO2 100%   Physical Exam Vitals and nursing note reviewed.  Constitutional:      General: She is not in acute distress.    Appearance: Normal appearance.  HENT:     Head: Normocephalic and atraumatic.  Eyes:     General:        Right eye: No discharge.        Left eye: No discharge.  Cardiovascular:     Rate and Rhythm: Normal rate and regular rhythm.     Heart sounds: No murmur heard.    No friction rub. No gallop.  Pulmonary:     Effort: Pulmonary effort is normal.     Breath sounds: Normal breath sounds.  Abdominal:     General: Bowel sounds are normal.     Palpations: Abdomen is soft.  Skin:    General: Skin is warm and dry.     Capillary Refill: Capillary refill takes less than 2 seconds.  Neurological:     Mental Status: She is alert and oriented to person, place, and time.  Psychiatric:        Mood and Affect: Mood normal.        Behavior: Behavior normal.     (all labs ordered are listed, but only abnormal results are displayed) Labs Reviewed  BASIC METABOLIC PANEL WITH GFR - Abnormal; Notable for the following components:      Result Value   Glucose, Bld 106 (*)    Creatinine, Ser 1.21 (*)    GFR, Estimated 46 (*)    All other components within normal limits  CBC - Abnormal; Notable for the following components:   RBC 3.65 (*)    Hemoglobin 11.5 (*)    All other components within normal limits  PROTIME-INR  TROPONIN I (HIGH SENSITIVITY)  TROPONIN I (HIGH SENSITIVITY)    EKG: EKG Interpretation Date/Time:  Thursday July 29 2024 15:47:26 EDT Ventricular Rate:  79 PR Interval:  134 QRS Duration:  74 QT Interval:  360 QTC Calculation: 412 R Axis:   81  Text Interpretation: Normal sinus rhythm Septal infarct , age undetermined Abnormal ECG When compared with ECG of 27-Nov-2023 09:43, PREVIOUS ECG IS PRESENT Confirmed by Neysa Clap [phone]) on 07/29/2024  9:20:19 PM  Radiology: [facility] Chest 2 View Result Date: 07/29/2024 CLINICAL DATA:  Chest pain. EXAM: CHEST - 2 VIEW COMPARISON:  September 30, 2023 FINDINGS: There is stable dual lead AICD positioning. The heart size and mediastinal contours are within normal limits. Marked severity calcification of the aortic arch is seen. No acute infiltrate, pleural effusion or pneumothorax is identified. Partially calcified bilateral breast implants are present. Multilevel degenerative changes are seen throughout the thoracic spine. IMPRESSION: No active cardiopulmonary disease. Electronically Signed   By: Suzen Dials M.D.   On: 07/29/2024 17:50  Procedures   Medications Ordered in the ED  hydrALAZINE  (APRESOLINE ) injection 10 mg (has no administration in time range)  acetaminophen  (TYLENOL ) tablet 650 mg (650 mg Oral Given 07/29/24 1716)                                    Medical Decision Making Amount and/or Complexity of Data Reviewed Labs: ordered. Radiology: ordered.   This patient is a 78 y.o. female  who presents to the ED for concern of chest pain, palpitations.   Differential diagnoses prior to evaluation: The emergent differential diagnosis includes, but is not limited to,  The differential diagnosis for palpitations includes cardiac arrhythmias, PVC/PAC, ACS, Cardiomyopathy, CHF, MVP, pericarditis, valvular disease, Panic/Anxiety, Somatic disorder, ETOH, Caffeine,  Stimulant use, medication side effect, Anemia, Hyperthyroidism, pulmonary embolism, ACS, AAS, PE, Mallory-Weiss, Boerhaave's, Pneumonia, acute bronchitis, asthma or COPD exacerbation, anxiety, MSK pain or traumatic injury to the chest, acid reflux versus other . This is not an exhaustive differential.   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  Pertinent results include: Reviewed lab work, imaging from previous emergency department visits, outpatient cardiology visits, previous echocardiograms, pacemaker interrogations.  Physical Exam: Physical exam performed. The pertinent findings include: Vital signs overall stable other than mild hypertension, blood pressure 147/75.  Lab Tests/Imaging studies: I personally interpreted labs/imaging and the pertinent results include: CBC with mild anemia, hemoglobin 11.5.  BMP overall stable, chronically elevated creatinine 1.21.  Her initial troponin is 15. Plain film chest xray with no acute intrathoracic abnormality. I agree with the radiologist interpretation.  Cardiac monitoring: EKG obtained and interpreted by myself and attending physician which shows: Normal sinus rhythm, no STEMI   Medications: I ordered medication including Tylenol  for headache.  I have reviewed the patients home medicines and have made adjustments as needed.   Consults: Spoke with Dr. Arnetha with cardiology who will plan to assess the patient at bedside.   Disposition: After consideration of the diagnostic results and the patients response to treatment, I feel that patient would benefit from cardiology admission for unstable angina.     Final diagnoses:  Unstable angina [facility])    ED Discharge Orders     None          Rosan Sherlean DEL, PA-C 07/29/24 2150    Neysa Caron PARAS, DO 07/29/24 2240

## 2024-07-29 NOTE — ED Triage Notes (Signed)
 Pt sent by cardiology office for further eval of chest pain that started last night, pt c.o tightness in her chest at this time. Pt states she had an episode last night where she could feel her heart racing and the pain was radiating into her left arm. Pt has a pacemaker-unsure what kind. Cards office interrogated her pacemaker and it showed runs of SVT last night.

## 2024-07-29 NOTE — Progress Notes (Signed)
 Cardiology Consultation:    Date:  07/29/2024   ID:  CATHLEEN YAGI, DOB 05-19-1946, MRN [id number]  PCP:  Jama Chow, MD  Cardiologist:  Alean SAUNDERS Davinder Haff, MD   Referring MD: Jama Chow, MD   No chief complaint on file.    ASSESSMENT AND PLAN:   Ms. Alwin 78 year old woman history of moderate coronary artery disease on cardiac CT August 2024 with abnormal CT FFR of very distal LAD and OM territories being medically managed,  normal biventricular function and diastolic function without significant valve abnormalities on echocardiogram 08/2023, sick sinus syndrome with symptomatic bradycardia and poor heart rate response s/p permanent pacemaker implant 09/29/2023,  hypertension, hyperlipidemia, CKD stage III, smokes tobacco [quit March 2025], peripheral neuropathy, benign essential tremor, asthma/COPD, chronic venous insufficiency. peripheral arterial disease follows up with vascular surgeon Dr. Magda being planned for aortogram and bilateral lower extremity angiogram with possible left lower extremity intervention tentatively plan for 10/17/205, remains on both warfarin and clopidogrel  from vascular standpoint.  Now here for routine follow-up visit but mentions symptoms of chest pain last night and this morning. Highly suspicious for unstable angina.  Other differential could be runs of SVT that she was noted to have on prior pacemaker checks.  Problem List Items Addressed This Visit     Chest pain of uncertain etiology   High suspicion for her symptoms to be unstable angina versus paroxysmal SVT. Given her comorbidities, advised her to go to the ER for prompt evaluation. She is agreeable and her son will bring her to the ER.  Called and updated the ER at [facility] over the phone.      Hypercholesterolemia - Primary   Target LDL less than 55 mg/dL. Last lipid panel 06/28/2024 optimal with total cholesterol 127, triglycerides 65, HDL 61, LDL 52. Continue rosuvastatin   40 mg once daily.       Sick sinus syndrome (HCC), symptomatic with bradycardia, s/p permanent pacemaker implant 09/29/2023   Normal functioning device on last check 06/29/2024. Short runs of SVT with the longest episode up to 20 seconds.  Her symptoms of chest discomfort last night and this morning could be related to runs of SVT although significant concern for angina given her underlying CAD history. Recommended she go to the ER for further evaluation.      Peripheral vascular disease, unspecified   Continue management as per Dr. Magda. With her ongoing symptoms of chest pain she will be going to the ER for further evaluation. Will send the office note to the vascular surgeon to update them.  Timing of vascular procedures inpatient versus reschedule outpatient will be determined based on her course in the ER.       She did not get an EKG done while being roomed into the visit today.  In the interest of time did not hold her back for an EKG as her son was ready to drive her.  Return to clinic tentatively in 4 to 6 weeks   History of Present Illness:    EVELIA WASKEY is a 78 y.o. female who is being seen today for follow-up visit. PCP is Jama Chow, MD. Last visit with me in the office was 12/25/2023. Follows up with Dr.Hawken and vascular surgery.  Here for the office visit today by herself.  Mentions her son is waiting outside.  Lives with her daughter at home.  Has history of moderate coronary artery disease on cardiac CT August 2024 with abnormal CT FFR of very  distal LAD and OM territories being medically managed,  normal biventricular function and diastolic function without significant valve abnormalities on echocardiogram 08/2023, sick sinus syndrome with symptomatic bradycardia and poor heart rate response s/p permanent pacemaker implant 09/29/2023,  hypertension, hyperlipidemia, CKD stage III, smokes tobacco [quit March 2025], peripheral neuropathy, benign essential  tremor, asthma/COPD, chronic venous insufficiency. peripheral arterial disease follows up with vascular surgeon Dr. Magda being planned for aortogram and bilateral lower extremity angiogram with possible left lower extremity intervention tentatively plan for 10/17/205, remains on both warfarin and clopidogrel  from vascular standpoint.  Mentions last night she has had an episode of chest pressure that started while she was laying in the bed.  Followed with the sensation of fast heartbeat.  Symptoms persisted for several minutes.  First nitroglycerin  eased up the pain did not resolve it.  After the second nitro her symptoms eased up.  She mention feeling scared as if she was going to die, but did not want to bother her family at night.  This morning 2 she had another episode but milder intensity and did not last for more than a minute.  Mentions overall her functional status has been limited due to bilateral lower extremity pain.  Worse on the left compared to the right.  Has been taking her medications consistently. Over the last 3 to 4 days her warfarin has been on hold for her upcoming vascular procedure tomorrow. Continues to take Plavix  uninterrupted.  Currently she denies any chest pain or shortness of breath.  No palpitations.  Last device check pacemaker 06/29/2024 normal functioning a sensed V paced, short runs of SVT with the longest episode up to 20 seconds.  Blood work from 06/28/2024 Total cholesterol 127, triglycerides 65, HDL 61, LDL 52 BUN 20, creatinine 8.63, eGFR 40 suggests chronic kidney disease stage III Sodium 139 and potassium 5.3, mild hyperkalemia Normal transaminases and alkaline phosphatase.  Mentions she quit smoking March 2025 after her last office visit here.   Past Medical History:  Diagnosis Date   Anemia 09/20/2014   Asthma 05/06/2014   Bilateral leg edema 09/15/2020   Bradycardia 09/28/2023   CAD (coronary artery disease), moderate nonobstructive disease CT  coronaries August 2024, mildly abnormal CT FFR very distal LAD and OM territory, medically managed 10/20/2023   COPD (chronic obstructive pulmonary disease) (HCC)    Critical lower limb ischemia (HCC) 01/08/2024   Daytime somnolence 09/15/2020   Dyslipidemia    GERD (gastroesophageal reflux disease)    GERD (gastroesophageal reflux disease)    High blood pressure    Hypercholesterolemia 09/20/2014   Hypokalemia    Medication management 09/15/2020   Murmur 09/15/2020   Muscle cramps    Osteoarthritis 09/20/2014   Other chest pain 09/15/2020   PAD (peripheral artery disease) 01/08/2024   Pain in both lower extremities 09/15/2020   Palpitations 08/06/2023   Peripheral vascular disease, unspecified 10/20/2023   Postural lightheadedness 08/18/2023   Precordial pain 09/15/2020   Presence of permanent cardiac pacemaker    Psoriasis    Sick sinus syndrome (HCC), symptomatic with bradycardia, s/p permanent pacemaker implant 09/29/2023 10/20/2023   Snoring 09/15/2020   SOB (shortness of breath) 09/15/2020   Symptomatic bradycardia 09/27/2023   Syncope and collapse 08/06/2023   Vitamin D deficiency 09/20/2014    Past Surgical History:  Procedure Laterality Date   ABDOMINAL AORTOGRAM W/LOWER EXTREMITY Right 09/05/2023   Procedure: ABDOMINAL AORTOGRAM W/LOWER EXTREMITY;  Surgeon: Magda [name], MD;  Location: [facility] INVASIVE CV LAB;  Service: Cardiovascular;  Laterality: Right;   ABDOMINAL AORTOGRAM W/LOWER EXTREMITY N/A 12/31/2023   Procedure: ABDOMINAL AORTOGRAM W/LOWER EXTREMITY;  Surgeon: Lanis Fonda BRAVO, MD;  Location: [facility] INVASIVE CV LAB;  Service: Cardiovascular;  Laterality: N/A;   ABDOMINAL HYSTERECTOMY     APPENDECTOMY     arthroscopic shoulder surgery     BACK SURGERY     CATARACT EXTRACTION, BILATERAL     CHOLECYSTECTOMY     ENDARTERECTOMY FEMORAL Right 01/08/2024   Procedure: ENDARTERECTOMY,  RIGHT ILIOFEMORAL;  Surgeon: Magda [name], MD;  Location: [facility] OR;  Service:  Vascular;  Laterality: Right;   FEMORAL-POPLITEAL BYPASS GRAFT Right 01/08/2024   Procedure: RIGHT FEMORAL TO BELOW KNEE POPLITEAL ARTERY BYPASS GRAFT 6mm RINGED PROPATEN GRAFT;  Surgeon: Magda [name], MD;  Location: [facility] OR;  Service: Vascular;  Laterality: Right;   LOWER EXTREMITY ANGIOGRAPHY Bilateral 12/31/2023   Procedure: Lower Extremity Angiography;  Surgeon: Lanis Fonda BRAVO, MD;  Location: [facility] INVASIVE CV LAB;  Service: Cardiovascular;  Laterality: Bilateral;   MASTECTOMY     MASTOIDECTOMY     PACEMAKER IMPLANT N/A 09/29/2023   Procedure: PACEMAKER IMPLANT - DUAL CHAMBER;  Surgeon: Waddell Danelle ORN, MD;  Location: [facility] INVASIVE CV LAB;  Service: Cardiovascular;  Laterality: N/A;   PARTIAL COLECTOMY     PERIPHERAL VASCULAR INTERVENTION Right 09/05/2023   Procedure: PERIPHERAL VASCULAR INTERVENTION;  Surgeon: Magda [name], MD;  Location: [facility] INVASIVE CV LAB;  Service: Cardiovascular;  Laterality: Right;  SFA    Current Medications: Current Meds  Medication Sig   acetaminophen  (TYLENOL ) 325 MG tablet Take 650 mg by mouth every 6 (six) hours as needed for mild pain (pain score 1-3) or moderate pain (pain score 4-6).   albuterol  (PROVENTIL  HFA;VENTOLIN  HFA) 108 (90 Base) MCG/ACT inhaler Inhale 1-2 puffs into the lungs as needed for wheezing or shortness of breath.   ALPRAZolam  (XANAX ) 0.25 MG tablet Take 0.25 mg by mouth every 6 (six) hours as needed for anxiety.   amLODipine (NORVASC) 5 MG tablet Take 5 mg by mouth daily.   benzonatate (TESSALON) 200 MG capsule Take 200 mg by mouth 3 (three) times daily as needed.   Budeson-Glycopyrrol-Formoterol (BREZTRI  AEROSPHERE) 160-9-4.8 MCG/ACT AERO Inhale 2 puffs into the lungs in the morning and at bedtime.   clopidogrel  (PLAVIX ) 75 MG tablet Take 1 tablet (75 mg total) by mouth daily.   cyclobenzaprine (FLEXERIL) 10 MG tablet Take 10 mg by mouth 2 (two) times daily as needed.   doxycycline (ADOXA) 100 MG tablet Take 100 mg by mouth 2 (two)  times daily.   furosemide  (LASIX ) 40 MG tablet Take 40 mg by mouth 2 (two) times daily as needed for fluid.   gabapentin  (NEURONTIN ) 300 MG capsule Take 300 mg by mouth at bedtime.   ibuprofen  (ADVIL ,MOTRIN ) 200 MG tablet Take 200 mg by mouth every 8 (eight) hours as needed for pain.   ipratropium-albuterol  (DUONEB) 0.5-2.5 (3) MG/3ML SOLN Take 3 mLs by nebulization every 4 (four) hours as needed for wheezing or shortness of breath.   metoprolol  tartrate (LOPRESSOR ) 50 MG tablet Take 1 tablet (50 mg total) by mouth 2 (two) times daily.   Multiple Vitamin (MULTIVITAMIN WITH MINERALS) TABS tablet Take 1 tablet by mouth every evening. Multivitamin for Women   nitroGLYCERIN  (NITROSTAT ) 0.4 MG SL tablet Place 1 tablet (0.4 mg total) under the tongue every 5 (five) minutes as needed for chest pain.   oxyCODONE -acetaminophen  (PERCOCET/ROXICET) 5-325 MG tablet Take 1 tablet by mouth every 4 (four) hours as  needed for severe pain (pain score 7-10).   PARoxetine  (PAXIL ) 40 MG tablet Take 40 mg by mouth every morning.   rosuvastatin  (CRESTOR ) 40 MG tablet Take 1 tablet (40 mg total) by mouth daily.   warfarin (COUMADIN ) 2.5 MG tablet Take 1 tablet (2.5 mg total) by mouth daily.     Allergies:   Aspirin , Levaquin [levofloxacin in d5w], and Codeine   Social History   Socioeconomic History   Marital status: Widowed    Spouse name: Not on file   Number of children: 2   Years of education: Not on file   Highest education level: Not on file  Occupational History   Not on file  Tobacco Use   Smoking status: Every Day    Current packs/day: 0.50    Types: Cigarettes   Smokeless tobacco: Never   Tobacco comments:    1-3 cigs daily  Vaping Use   Vaping status: Never Used  Substance and Sexual Activity   Alcohol use: Never   Drug use: Never   Sexual activity: Not on file  Other Topics Concern   Not on file  Social History Narrative   Not on file   Social Drivers of Health   Financial Resource  Strain: Not on file  Food Insecurity: Patient Declined (01/08/2024)   Hunger Vital Sign    Worried About Running Out of Food in the Last Year: Patient declined    Ran Out of Food in the Last Year: Patient declined  Transportation Needs: Patient Declined (01/08/2024)   PRAPARE - Administrator, Civil Service (Medical): Patient declined    Lack of Transportation (Non-Medical): Patient declined  Physical Activity: Not on file  Stress: Not on file  Social Connections: Patient Declined (01/08/2024)   Social Connection and Isolation Panel    Frequency of Communication with Friends and Family: Patient declined    Frequency of Social Gatherings with Friends and Family: Patient declined    Attends Religious Services: Patient declined    Database administrator or Organizations: Patient declined    Attends Banker Meetings: Patient declined    Marital Status: Patient declined     Family History: The patient's family history includes Heart disease in her father; Hypertension in her father; Pancreatic cancer in her brother, mother, and sister. ROS:   Please see the history of present illness.    All 14 point review of systems negative except as described per history of present illness.  EKGs/Labs/Other Studies Reviewed:    The following studies were reviewed today:   EKG:       Recent Labs: 09/27/2023: TSH 1.584 09/29/2023: Magnesium  2.0 01/08/2024: ALT 15 01/11/2024: BUN 22; Creatinine, Ser 1.50; Potassium 3.4; Sodium 136 01/13/2024: Hemoglobin 9.6; Platelets 136  Recent Lipid Panel    Component Value Date/Time   CHOL 103 01/09/2024 0401   TRIG 56 01/09/2024 0401   HDL 56 01/09/2024 0401   CHOLHDL 1.8 01/09/2024 0401   VLDL 11 01/09/2024 0401   LDLCALC 36 01/09/2024 0401    Physical Exam:    VS:  BP 120/64   Pulse 94   Ht 5' 6 (1.676 m)   Wt 144 lb 6.4 oz (65.5 kg)   SpO2 96%   BMI 23.31 kg/m     Wt Readings from Last 3 Encounters:  07/29/24 144 lb  6.4 oz (65.5 kg)  07/20/24 144 lb (65.3 kg)  06/08/24 144 lb (65.3 kg)     GENERAL:  Well nourished,  well developed in no acute distress NECK: No JVD; No carotid bruits CARDIAC: RRR, S1 and S2 present, no murmurs, no rubs, no gallops CHEST:  Clear to auscultation without rales, wheezing or rhonchi  Extremities: Mild bilateral pitting pedal edema extending up to the knees. Pulses bilaterally symmetric with radial 2+ and dorsalis pedis 2+ NEUROLOGIC:  Alert and oriented x 3  Medication Adjustments/Labs and Tests Ordered: Current medicines are reviewed at length with the patient today.  Concerns regarding medicines are outlined above.  No orders of the defined types were placed in this encounter.  No orders of the defined types were placed in this encounter.   Signed, Alean jess Kobus, MD, MPH, [facility]. 07/29/2024 1:49 PM    [facility]

## 2024-07-29 NOTE — Assessment & Plan Note (Signed)
 High suspicion for her symptoms to be unstable angina versus paroxysmal SVT. Given her comorbidities, advised her to go to the ER for prompt evaluation. She is agreeable and her son will bring her to the ER.  Called and updated the ER at [facility] over the phone.

## 2024-07-29 NOTE — ED Notes (Signed)
 Extra Blue top drawn

## 2024-07-29 NOTE — Assessment & Plan Note (Signed)
 Target LDL less than 55 mg/dL. Last lipid panel 06/28/2024 optimal with total cholesterol 127, triglycerides 65, HDL 61, LDL 52. Continue rosuvastatin  40 mg once daily.

## 2024-07-29 NOTE — Progress Notes (Signed)
 ANTICOAGULATION CONSULT NOTE  Pharmacy Consult for Heparin  Indication: chest pain/ACS  Allergies  Allergen Reactions   Aspirin  Itching, Swelling and Other (See Comments)    Tongue swelling   Levaquin [Levofloxacin In D5w] Other (See Comments)    Severe yeast reaction.   Codeine Rash    Patient Measurements:   Heparin  Dosing Weight: 65.5 kg  Vital Signs: Temp: 97.4 F (36.3 C) (10/16 2100) Temp Source: Oral (10/16 2100) BP: 157/70 (10/16 2250) Pulse Rate: 70 (10/16 2250)  Labs: Recent Labs    07/29/24 1548 07/29/24 1926  HGB 11.5*  --   HCT 36.2  --   PLT 151  --   LABPROT  --  12.9  INR  --  0.9  CREATININE 1.21*  --   TROPONINIHS 15 15    Estimated Creatinine Clearance: 36.4 mL/min (A) (by C-G formula based on SCr of 1.21 mg/dL (H)).   Medical History: Past Medical History:  Diagnosis Date   Anemia 09/20/2014   Asthma 05/06/2014   Bilateral leg edema 09/15/2020   Bradycardia 09/28/2023   CAD (coronary artery disease), moderate nonobstructive disease CT coronaries August 2024, mildly abnormal CT FFR very distal LAD and OM territory, medically managed 10/20/2023   COPD (chronic obstructive pulmonary disease) (HCC)    Critical lower limb ischemia (HCC) 01/08/2024   Daytime somnolence 09/15/2020   Dyslipidemia    GERD (gastroesophageal reflux disease)    GERD (gastroesophageal reflux disease)    High blood pressure    Hypercholesterolemia 09/20/2014   Hypokalemia    Medication management 09/15/2020   Murmur 09/15/2020   Muscle cramps    Osteoarthritis 09/20/2014   Other chest pain 09/15/2020   PAD (peripheral artery disease) 01/08/2024   Pain in both lower extremities 09/15/2020   Palpitations 08/06/2023   Peripheral vascular disease, unspecified 10/20/2023   Postural lightheadedness 08/18/2023   Precordial pain 09/15/2020   Presence of permanent cardiac pacemaker    Psoriasis    Sick sinus syndrome (HCC), symptomatic with bradycardia, s/p  permanent pacemaker implant 09/29/2023 10/20/2023   Snoring 09/15/2020   SOB (shortness of breath) 09/15/2020   Symptomatic bradycardia 09/27/2023   Syncope and collapse 08/06/2023   Vitamin D deficiency 09/20/2014    Medications:  (Not in a hospital admission)  Scheduled:   hydrALAZINE   10 mg Intravenous Once   Infusions:  PRN: acetaminophen , nitroGLYCERIN , ondansetron  (ZOFRAN ) IV  Assessment: 15 yof with a history of CAD, SSS s/p PPM, CKD, PAD s/p right iliofemoral endarterectomy and fem-pop bypass (3/25). Patient is presenting with chest pain. Heparin  per pharmacy consult placed for chest pain/ACS.  Patient is on warfarin prior to arrival. Warfarin has been on hold for past 4 days prior to planned aortogram and bilateral lower extremity angiogram with possible left lower extremity intervention.  PT/INR 12.9/0.9 Hgb 11.5; plt 151  Goal of Therapy:  Heparin  level 0.3-0.7 units/ml Monitor platelets by anticoagulation protocol: Yes   Plan:  Give IV heparin  3800 units bolus x 1 Start heparin  infusion at 800 units/hr Check anti-Xa level in 8 hours and daily while on heparin  Continue to monitor H&H and platelets  Dorn Buttner, PharmD, BCPS 07/29/2024 11:00 PM ED Clinical Pharmacist -  [phone]

## 2024-07-29 NOTE — H&P (Signed)
 Cardiology Admission History and Physical   Patient ID: Tina George MRN: [id number]; DOB: June 17, 1946   Admission date: 07/29/2024  PCP:  Jama Chow, MD   [facility] Providers Cardiologist:  None  Electrophysiologist:  OLE ONEIDA HOLTS, MD      Chief Complaint:  chest pain  Patient Profile: Tina George is a 78 y.o. female with with a hx of CAD medically managed, SSS s/p PPM (12/24), SVT on prior PPM checks, CKDIII, PAD s/p right iliofemoral endarterectomy and fem-pop bypass (3/25) presenting with CP.    History of Present Illness: Tina George   See consult note from 10/16 for HPI   Past Medical History:  Diagnosis Date   Anemia 09/20/2014   Asthma 05/06/2014   Bilateral leg edema 09/15/2020   Bradycardia 09/28/2023   CAD (coronary artery disease), moderate nonobstructive disease CT coronaries August 2024, mildly abnormal CT FFR very distal LAD and OM territory, medically managed 10/20/2023   COPD (chronic obstructive pulmonary disease) (HCC)    Critical lower limb ischemia (HCC) 01/08/2024   Daytime somnolence 09/15/2020   Dyslipidemia    GERD (gastroesophageal reflux disease)    GERD (gastroesophageal reflux disease)    High blood pressure    Hypercholesterolemia 09/20/2014   Hypokalemia    Medication management 09/15/2020   Murmur 09/15/2020   Muscle cramps    Osteoarthritis 09/20/2014   Other chest pain 09/15/2020   PAD (peripheral artery disease) 01/08/2024   Pain in both lower extremities 09/15/2020   Palpitations 08/06/2023   Peripheral vascular disease, unspecified 10/20/2023   Postural lightheadedness 08/18/2023   Precordial pain 09/15/2020   Presence of permanent cardiac pacemaker    Psoriasis    Sick sinus syndrome (HCC), symptomatic with bradycardia, s/p permanent pacemaker implant 09/29/2023 10/20/2023   Snoring 09/15/2020   SOB (shortness of breath) 09/15/2020   Symptomatic bradycardia 09/27/2023   Syncope and collapse 08/06/2023    Vitamin D deficiency 09/20/2014   Past Surgical History:  Procedure Laterality Date   ABDOMINAL AORTOGRAM W/LOWER EXTREMITY Right 09/05/2023   Procedure: ABDOMINAL AORTOGRAM W/LOWER EXTREMITY;  Surgeon: Magda [name], MD;  Location: [facility] INVASIVE CV LAB;  Service: Cardiovascular;  Laterality: Right;   ABDOMINAL AORTOGRAM W/LOWER EXTREMITY N/A 12/31/2023   Procedure: ABDOMINAL AORTOGRAM W/LOWER EXTREMITY;  Surgeon: Lanis Fonda BRAVO, MD;  Location: [facility] INVASIVE CV LAB;  Service: Cardiovascular;  Laterality: N/A;   ABDOMINAL HYSTERECTOMY     APPENDECTOMY     arthroscopic shoulder surgery     BACK SURGERY     CATARACT EXTRACTION, BILATERAL     CHOLECYSTECTOMY     ENDARTERECTOMY FEMORAL Right 01/08/2024   Procedure: ENDARTERECTOMY,  RIGHT ILIOFEMORAL;  Surgeon: Magda [name], MD;  Location: [facility] OR;  Service: Vascular;  Laterality: Right;   FEMORAL-POPLITEAL BYPASS GRAFT Right 01/08/2024   Procedure: RIGHT FEMORAL TO BELOW KNEE POPLITEAL ARTERY BYPASS GRAFT 6mm RINGED PROPATEN GRAFT;  Surgeon: Magda [name], MD;  Location: [facility] OR;  Service: Vascular;  Laterality: Right;   LOWER EXTREMITY ANGIOGRAPHY Bilateral 12/31/2023   Procedure: Lower Extremity Angiography;  Surgeon: Lanis Fonda BRAVO, MD;  Location: [facility] INVASIVE CV LAB;  Service: Cardiovascular;  Laterality: Bilateral;   MASTECTOMY     MASTOIDECTOMY     PACEMAKER IMPLANT N/A 09/29/2023   Procedure: PACEMAKER IMPLANT - DUAL CHAMBER;  Surgeon: Waddell Danelle ORN, MD;  Location: [facility] INVASIVE CV LAB;  Service: Cardiovascular;  Laterality: N/A;   PARTIAL COLECTOMY     PERIPHERAL VASCULAR INTERVENTION Right 09/05/2023  Procedure: PERIPHERAL VASCULAR INTERVENTION;  Surgeon: Magda [name], MD;  Location: [facility] INVASIVE CV LAB;  Service: Cardiovascular;  Laterality: Right;  SFA     Medications Prior to Admission: Prior to Admission medications   Medication Sig Start Date End Date Taking? Authorizing Provider  acetaminophen  (TYLENOL ) 325 MG tablet  Take 650 mg by mouth every 6 (six) hours as needed for mild pain (pain score 1-3) or moderate pain (pain score 4-6).    [provider]  albuterol  (PROVENTIL  HFA;VENTOLIN  HFA) 108 (90 Base) MCG/ACT inhaler Inhale 1-2 puffs into the lungs as needed for wheezing or shortness of breath.    [provider]  ALPRAZolam  (XANAX ) 0.25 MG tablet Take 0.25 mg by mouth every 6 (six) hours as needed for anxiety. 07/04/20   [provider]  amLODipine (NORVASC) 5 MG tablet Take 5 mg by mouth daily. 06/05/24   [provider]  benzonatate (TESSALON) 200 MG capsule Take 200 mg by mouth 3 (three) times daily as needed. 12/18/23   [provider]  Budeson-Glycopyrrol-Formoterol (BREZTRI  AEROSPHERE) 160-9-4.8 MCG/ACT AERO Inhale 2 puffs into the lungs in the morning and at bedtime. 12/29/20   Kara Dorn NOVAK, MD  clopidogrel  (PLAVIX ) 75 MG tablet Take 1 tablet (75 mg total) by mouth daily. 10/20/23   Madireddy, Alean SAUNDERS, MD  cyclobenzaprine (FLEXERIL) 10 MG tablet Take 10 mg by mouth 2 (two) times daily as needed. 06/07/24   [provider]  doxycycline (ADOXA) 100 MG tablet Take 100 mg by mouth 2 (two) times daily.    [provider]  furosemide  (LASIX ) 40 MG tablet Take 40 mg by mouth 2 (two) times daily as needed for fluid.    [provider]  gabapentin  (NEURONTIN ) 300 MG capsule Take 300 mg by mouth at bedtime. 05/08/23   [provider]  ibuprofen  (ADVIL ,MOTRIN ) 200 MG tablet Take 200 mg by mouth every 8 (eight) hours as needed for pain.    [provider]  ipratropium-albuterol  (DUONEB) 0.5-2.5 (3) MG/3ML SOLN Take 3 mLs by nebulization every 4 (four) hours as needed for wheezing or shortness of breath. 08/25/20   [provider]  metoprolol  tartrate (LOPRESSOR ) 50 MG tablet Take 1 tablet (50 mg total) by mouth 2 (two) times daily. 12/24/23   Krasowski, Robert J, MD  Multiple Vitamin (MULTIVITAMIN WITH MINERALS) TABS  tablet Take 1 tablet by mouth every evening. Multivitamin for Women    [provider]  nitroGLYCERIN  (NITROSTAT ) 0.4 MG SL tablet Place 1 tablet (0.4 mg total) under the tongue every 5 (five) minutes as needed for chest pain. 05/30/23   Carlin Delon BROCKS, NP  oxyCODONE -acetaminophen  (PERCOCET/ROXICET) 5-325 MG tablet Take 1 tablet by mouth every 4 (four) hours as needed for severe pain (pain score 7-10). 01/13/24   Bethanie Cough, PA-C  PARoxetine  (PAXIL ) 40 MG tablet Take 40 mg by mouth every morning. 11/17/20   [provider]  rosuvastatin  (CRESTOR ) 40 MG tablet Take 1 tablet (40 mg total) by mouth daily. 10/20/23   Madireddy, Alean SAUNDERS, MD  warfarin (COUMADIN ) 2.5 MG tablet Take 1 tablet (2.5 mg total) by mouth daily. 01/14/24   Bethanie Cough, PA-C     Allergies:    Allergies  Allergen Reactions   Aspirin  Itching, Swelling and Other (See Comments)    Tongue swelling   Levaquin [Levofloxacin In D5w] Other (See Comments)    Severe yeast reaction.   Codeine Rash    Social History:   Social History  Socioeconomic History   Marital status: Widowed    Spouse name: Not on file   Number of children: 2   Years of education: Not on file   Highest education level: Not on file  Occupational History   Not on file  Tobacco Use   Smoking status: Every Day    Current packs/day: 0.50    Types: Cigarettes   Smokeless tobacco: Never   Tobacco comments:    1-3 cigs daily  Vaping Use   Vaping status: Never Used  Substance and Sexual Activity   Alcohol use: Never   Drug use: Never   Sexual activity: Not on file  Other Topics Concern   Not on file  Social History Narrative   Not on file   Social Drivers of Health   Financial Resource Strain: Not on file  Food Insecurity: Patient Declined (01/08/2024)   Hunger Vital Sign    Worried About Running Out of Food in the Last Year: Patient declined    Ran Out of Food in the Last Year: Patient declined  Transportation Needs:  Patient Declined (01/08/2024)   PRAPARE - Administrator, Civil Service (Medical): Patient declined    Lack of Transportation (Non-Medical): Patient declined  Physical Activity: Not on file  Stress: Not on file  Social Connections: Patient Declined (01/08/2024)   Social Connection and Isolation Panel    Frequency of Communication with Friends and Family: Patient declined    Frequency of Social Gatherings with Friends and Family: Patient declined    Attends Religious Services: Patient declined    Database administrator or Organizations: Patient declined    Attends Banker Meetings: Patient declined    Marital Status: Patient declined  Intimate Partner Violence: Patient Declined (01/08/2024)   Humiliation, Afraid, Rape, and Kick questionnaire    Fear of Current or Ex-Partner: Patient declined    Emotionally Abused: Patient declined    Physically Abused: Patient declined    Sexually Abused: Patient declined     Family History:   The patient's family history includes Heart disease in her father; Hypertension in her father; Pancreatic cancer in her brother, mother, and sister.    ROS:  Please see the history of present illness.  All other ROS reviewed and negative.     Physical Exam/Data: Vitals:   07/29/24 1800 07/29/24 1815 07/29/24 1944 07/29/24 2100  BP: (!) 174/75 (!) 189/82 (!) 186/78 (!) 200/84  Pulse: 67 65 67 82  Resp: 16 19 16  (!) 24  Temp:   98.2 F (36.8 C) (!) 97.4 F (36.3 C)  TempSrc:   Oral Oral  SpO2: 100% 100% 100% 100%   No intake or output data in the 24 hours ending 07/29/24 2158    07/29/2024    1:14 PM 07/20/2024   10:08 AM 06/08/2024    1:12 PM  Last 3 Weights  Weight (lbs) 144 lb 6.4 oz 144 lb 144 lb  Weight (kg) 65.499 kg 65.318 kg 65.318 kg     There is no height or weight on file to calculate BMI.  General:  Well nourished, well developed, in no acute distress HEENT: normal Neck: no JVD Vascular: No carotid bruits;  Distal pulses 2+ bilaterally   Cardiac:  normal S1, S2; RRR; no murmur  Lungs:  clear to auscultation bilaterally, no wheezing, rhonchi or rales  Abd: soft, nontender, no hepatomegaly  Ext: no edema Musculoskeletal:  No deformities, BUE and BLE strength normal and equal Skin: warm  and dry  Neuro:  CNs 2-12 intact, no focal abnormalities noted Psych:  Normal affect   EKG:  The ECG that was done  was personally reviewed and demonstrates sinus rhythm  Relevant CV Studies: reviewed  Laboratory Data: High Sensitivity Troponin:   Recent Labs  Lab 07/29/24 1548 07/29/24 1926  TROPONINIHS 15 15      Chemistry Recent Labs  Lab 07/29/24 1548  NA 140  K 3.6  CL 108  CO2 24  GLUCOSE 106*  BUN 11  CREATININE 1.21*  CALCIUM  8.9  GFRNONAA 46*  ANIONGAP 8    No results for input(s): PROT, ALBUMIN, AST, ALT, ALKPHOS, BILITOT in the last 168 hours. Lipids No results for input(s): CHOL, TRIG, HDL, LABVLDL, LDLCALC, CHOLHDL in the last 168 hours. Hematology Recent Labs  Lab 07/29/24 1548  WBC 6.6  RBC 3.65*  HGB 11.5*  HCT 36.2  MCV 99.2  MCH 31.5  MCHC 31.8  RDW 15.1  PLT 151   Thyroid  No results for input(s): TSH, FREET4 in the last 168 hours. BNPNo results for input(s): BNP, PROBNP in the last 168 hours.  DDimer No results for input(s): DDIMER in the last 168 hours.  Radiology/Studies:  DG Chest 2 View Result Date: 07/29/2024 CLINICAL DATA:  Chest pain. EXAM: CHEST - 2 VIEW COMPARISON:  September 30, 2023 FINDINGS: There is stable dual lead AICD positioning. The heart size and mediastinal contours are within normal limits. Marked severity calcification of the aortic arch is seen. No acute infiltrate, pleural effusion or pneumothorax is identified. Partially calcified bilateral breast implants are present. Multilevel degenerative changes are seen throughout the thoracic spine. IMPRESSION: No active cardiopulmonary disease. Electronically  Signed   By: Suzen Dials M.D.   On: 07/29/2024 17:50     Assessment and Plan:  Tina George is a 78 y.o. female with with a hx of CAD medically managed, SSS s/p PPM (12/24), SVT on prior PPM checks, CKDIII, PAD s/p right iliofemoral endarterectomy and fem-pop bypass (3/25) presenting with CP.    #CP c/f unstable angina #Mod CAD on CT #SSS s/p PPM #PAD s/p iliofemoral endarterectomy and fem bypass - Presenting an episode of CP last night concerning for unstable angina. Initial trop 15 and ECG w/o ischemic changes. She did report some feeling of palpitations, and thus SVT remains on the differential.  - NPO at midnight for for possible LHC assuming INR < 2.0 - Cannot do aspirin  2/2 allergy -IV heparin  - Check INR and trend troponin - Re-start amlo 5, metop 50 BID, rosuvastatin  40, and sublingual nitroglycerin  as needed - Obtain TTE - Plan for PPM interrogation in AM -holding warfarin     Risk Assessment/Risk Scores:   TIMI Risk Score for Unstable Angina or Non-ST Elevation MI:   The patient's TIMI risk score is  , which indicates a  % risk of all cause mortality, new or recurrent myocardial infarction or need for urgent revascularization in the next 14 days.     Code Status: Full Code  Severity of Illness: The appropriate patient status for this patient is INPATIENT. Inpatient status is judged to be reasonable and necessary in order to provide the required intensity of service to ensure the patient's safety. The patient's presenting symptoms, physical exam findings, and initial radiographic and laboratory data in the context of their chronic comorbidities is felt to place them at high risk for further clinical deterioration. Furthermore, it is not anticipated that the patient will be medically stable for discharge  from the hospital within 2 midnights of admission.   * I certify that at the point of admission it is my clinical judgment that the patient will require  inpatient hospital care spanning beyond 2 midnights from the point of admission due to high intensity of service, high risk for further deterioration and high frequency of surveillance required.*  For questions or updates, please contact [facility] Please consult [email or web address] for contact info under       Signed, Thomasina Housley A Milos Milligan, MD  07/29/2024 9:58 PM

## 2024-07-29 NOTE — Assessment & Plan Note (Signed)
 Normal functioning device on last check 06/29/2024. Short runs of SVT with the longest episode up to 20 seconds.  Her symptoms of chest discomfort last night and this morning could be related to runs of SVT although significant concern for angina given her underlying CAD history. Recommended she go to the ER for further evaluation.

## 2024-07-29 NOTE — Patient Instructions (Addendum)
 Medication Instructions:  Your physician recommends that you continue on your current medications as directed. Please refer to the Current Medication list given to you today.  *If you need a refill on your cardiac medications before your next appointment, please call your pharmacy*  Lab Work: None If you have labs (blood work) drawn today and your tests are completely normal, you will receive your results only by: MyChart Message (if you have MyChart) OR A paper copy in the mail If you have any lab test that is abnormal or we need to change your treatment, we will call you to review the results.  Testing/Procedures: None  Follow-Up: At [facility], you and your health needs are our priority.  As part of our continuing mission to provide you with exceptional heart care, our providers are all part of one team.  This team includes your primary Cardiologist (physician) and Advanced Practice Providers or APPs (Physician Assistants and Nurse Practitioners) who all work together to provide you with the care you need, when you need it.  Your next appointment:   6 week(s)  Provider:   Alean Kobus, MD    We recommend signing up for the patient portal called MyChart.  Sign up information is provided on this After Visit Summary.  MyChart is used to connect with patients for Virtual Visits (Telemedicine).  Patients are able to view lab/test results, encounter notes, upcoming appointments, etc.  Non-urgent messages can be sent to your provider as well.   To learn more about what you can do with MyChart, go to [email or web address].   Other Instructions Go to the ER.

## 2024-07-29 NOTE — ED Notes (Signed)
 ED Provider at bedside.

## 2024-07-29 NOTE — Consult Note (Addendum)
 Cardiology Consultation:  Patient ID: Tina George MRN: [id number]; DOB: 15-Feb-1946 Admit date: 07/29/2024 Date of Consult: 07/29/2024 Primary Care Provider: Jama Chow, MD Primary Cardiologist: None  Primary Electrophysiologist:  OLE ONEIDA HOLTS, MD  Patient Profile:  Tina George is a 78 y.o. female with a hx of CAD medically managed, SSS s/p PPM (12/24), SVT on prior PPM checks, CKDIII, PAD s/p right iliofemoral endarterectomy and fem-pop bypass (3/25) who is being seen today for the evaluation of CP at the request of Dr. Neysa.  History of Present Illness:  Ms. Short presents with CP. Last night while laying down, she had severe left sided CP that radiated to her back. The pain felt tight and associated with dyspnea. She took 1 tab of nitroglycerin  but no improvement, and subsequently took another tab with resolution. This morning, she had an episode that milder and did not require nitroglycerin . She reports about 2 episodes of chest tightness a month that is sometimes with exertion, sometimes at rest. She denies LE edema, orthopnea, or PND. Warfarin has been held for 4 days in anticipation of vascular procedure.   Past Medical History: CAD, SSS s/p PPM, SVT, CKDIII, PAD  Past Surgical History: PPM 12/24  Allergies:    Allergies  Allergen Reactions   Aspirin  Itching, Swelling and Other (See Comments)    Tongue swelling   Levaquin [Levofloxacin In D5w] Other (See Comments)    Severe yeast reaction.   Codeine Rash    Social History: 50-pack tobacco use  Family History:   Noncontributory  ROS:  All other ROS reviewed and negative. Pertinent positives noted in the HPI.     Physical Exam/Data:   Vitals:   07/29/24 1542 07/29/24 1615 07/29/24 1630  BP: (!) 147/75 (!) 170/72 (!) 156/82  Pulse: 89 74 72  Resp: 18 16 (!) 22  Temp: 97.7 F (36.5 C)    SpO2: 99% 100% 100%   No intake or output data in the 24 hours ending 07/29/24 1719     07/29/2024    1:14 PM 07/20/2024    10:08 AM 06/08/2024    1:12 PM  Last 3 Weights  Weight (lbs) 144 lb 6.4 oz 144 lb 144 lb  Weight (kg) 65.499 kg 65.318 kg 65.318 kg    There is no height or weight on file to calculate BMI.  General: Well nourished, well developed, in no acute distress HEENT: Atraumatic, no JVD Cardiac: Normal S1, S2; RRR; no murmurs, rubs, or gallops Lungs:CTAB, no wheezing, rhonchi or rales  Abd: Soft, nontender, no hepatomegaly  Ext: No pulses on LLE Skin: Warm and dry, no rashes     Relevant CV Studies: Trop/BNP: 15 TTE 11/24: 60-65%, mild LVH Coronary CTA 8/24:  CAC 2228, mod stenosis of LAD w/ mildly abnormal FFR in distal LAD and OM likely due to tapering effect Assessment and Plan:  BAILLEY George is a 78 y.o. female with a hx of CAD medically managed, SSS s/p PPM (12/24), SVT on prior PPM checks, CKDIII, PAD s/p right iliofemoral endarterectomy and fem-pop bypass (3/25) presenting with CP.  #CP c/f unstable angina #Mod CAD on CT #SSS s/p PPM #PAD s/p iliofemoral endarterectomy and fem bypass - Presenting an episode of CP last night concerning for unstable angina. Initial trop 15 and ECG w/o ischemic changes. She did report some feeling of palpitations, and thus SVT remains on the differential.  - Cannot do aspirin  2/2 allergy; Re-start Plavix  75 mg daily - Check INR and trend  troponin - Re-start amlo 5, metop 50 BID, rosuvastatin  40, and sublingual nitroglycerin  as needed - Obtain TTE - Plan for PPM interrogation in AM - NPO at midnight for for possible LHC assuming INR < 2.0  Informed Consent   Shared Decision Making/Informed Consent The risks [stroke (1 in 1000), death (1 in 1000), kidney failure [usually temporary] (1 in 500), bleeding (1 in 200), allergic reaction [possibly serious] (1 in 200)], benefits (diagnostic support and management of coronary artery disease) and alternatives of a cardiac catheterization were discussed in detail with Ms. Renninger and she is willing to proceed.      For questions or updates, please contact [facility] Please consult [email or web address] for contact info under    Signed, Joelle DEL. Ren Ny, MD, [facility] [place]  [facility]  07/29/2024 5:19 PM

## 2024-07-29 NOTE — ED Notes (Signed)
 Patient transported to X-ray

## 2024-07-29 NOTE — Assessment & Plan Note (Signed)
 Continue management as per Dr. Magda. With her ongoing symptoms of chest pain she will be going to the ER for further evaluation. Will send the office note to the vascular surgeon to update them.  Timing of vascular procedures inpatient versus reschedule outpatient will be determined based on her course in the ER.

## 2024-07-30 ENCOUNTER — Inpatient Hospital Stay (HOSPITAL_COMMUNITY): Admission: EM | Disposition: A | Payer: Self-pay | Source: Home / Self Care

## 2024-07-30 ENCOUNTER — Other Ambulatory Visit (HOSPITAL_COMMUNITY): Payer: Self-pay

## 2024-07-30 ENCOUNTER — Other Ambulatory Visit: Payer: Self-pay

## 2024-07-30 ENCOUNTER — Ambulatory Visit (HOSPITAL_COMMUNITY): Admission: RE | Admit: 2024-07-30 | Source: Home / Self Care | Admitting: Vascular Surgery

## 2024-07-30 ENCOUNTER — Encounter (HOSPITAL_COMMUNITY): Payer: Self-pay | Admitting: Internal Medicine

## 2024-07-30 ENCOUNTER — Other Ambulatory Visit: Payer: Self-pay | Admitting: Cardiology

## 2024-07-30 ENCOUNTER — Encounter (HOSPITAL_COMMUNITY): Admission: EM | Disposition: A | Discharge status: Home / Self Care | Payer: Self-pay | Source: Home / Self Care

## 2024-07-30 ENCOUNTER — Inpatient Hospital Stay (HOSPITAL_COMMUNITY)

## 2024-07-30 DIAGNOSIS — I25110 Atherosclerotic heart disease of native coronary artery with unstable angina pectoris: Secondary | ICD-10-CM | POA: Diagnosis not present

## 2024-07-30 DIAGNOSIS — I2510 Atherosclerotic heart disease of native coronary artery without angina pectoris: Secondary | ICD-10-CM | POA: Diagnosis not present

## 2024-07-30 HISTORY — PX: CORONARY STENT INTERVENTION: CATH118234

## 2024-07-30 HISTORY — PX: LEFT HEART CATH AND CORONARY ANGIOGRAPHY: CATH118249

## 2024-07-30 LAB — CBC
HCT: 34.9 % — ABNORMAL LOW (ref 36.0–46.0)
Hemoglobin: 11 g/dL — ABNORMAL LOW (ref 12.0–15.0)
MCH: 30.8 pg (ref 26.0–34.0)
MCHC: 31.5 g/dL (ref 30.0–36.0)
MCV: 97.8 fL (ref 80.0–100.0)
Platelets: 140 K/uL — ABNORMAL LOW (ref 150–400)
RBC: 3.57 MIL/uL — ABNORMAL LOW (ref 3.87–5.11)
RDW: 15.2 % (ref 11.5–15.5)
WBC: 6.1 K/uL (ref 4.0–10.5)
nRBC: 0 % (ref 0.0–0.2)

## 2024-07-30 LAB — BASIC METABOLIC PANEL WITH GFR
Anion gap: 11 (ref 5–15)
BUN: 11 mg/dL (ref 8–23)
CO2: 23 mmol/L (ref 22–32)
Calcium: 8.9 mg/dL (ref 8.9–10.3)
Chloride: 106 mmol/L (ref 98–111)
Creatinine, Ser: 1.1 mg/dL — ABNORMAL HIGH (ref 0.44–1.00)
GFR, Estimated: 52 mL/min — ABNORMAL LOW (ref >60)
Glucose, Bld: 88 mg/dL (ref 70–99)
Potassium: 3.2 mmol/L — ABNORMAL LOW (ref 3.5–5.1)
Sodium: 140 mmol/L (ref 135–145)

## 2024-07-30 LAB — POCT ACTIVATED CLOTTING TIME
Activated Clotting Time: 0 s
Activated Clotting Time: 268 s

## 2024-07-30 LAB — PROTIME-INR
INR: 1 (ref 0.8–1.2)
Prothrombin Time: 13.4 s (ref 11.4–15.2)

## 2024-07-30 LAB — ECHOCARDIOGRAM COMPLETE BUBBLE STUDY
Area-P 1/2: 4.1 cm2
S' Lateral: 2.26 cm

## 2024-07-30 LAB — HEPARIN LEVEL (UNFRACTIONATED): Heparin Unfractionated: 0.27 [IU]/mL — ABNORMAL LOW (ref 0.30–0.70)

## 2024-07-30 SURGERY — LEFT HEART CATH AND CORONARY ANGIOGRAPHY
Anesthesia: LOCAL

## 2024-07-30 SURGERY — ABDOMINAL AORTOGRAM W/LOWER EXTREMITY
Anesthesia: LOCAL

## 2024-07-30 MED ORDER — HYDRALAZINE HCL 20 MG/ML IJ SOLN: 10.0000 mg | INTRAMUSCULAR | Status: AC | PRN

## 2024-07-30 MED ORDER — LIDOCAINE HCL (PF) 1 % IJ SOLN
INTRAMUSCULAR | Status: DC | PRN
  Administered 2024-07-30: 2 mL via INTRADERMAL

## 2024-07-30 MED ORDER — PAROXETINE HCL 20 MG PO TABS
40.0000 mg | ORAL_TABLET | Freq: Every morning | ORAL | Status: DC
  Administered 2024-07-30 – 2024-07-31 (×2): 40 mg via ORAL
  Filled 2024-07-30 (×3): qty 2

## 2024-07-30 MED ORDER — LIDOCAINE HCL (PF) 1 % IJ SOLN
INTRAMUSCULAR | Status: AC
  Filled 2024-07-30: qty 30

## 2024-07-30 MED ORDER — ROSUVASTATIN CALCIUM 20 MG PO TABS
40.0000 mg | ORAL_TABLET | Freq: Every day | ORAL | Status: DC
  Administered 2024-07-30 – 2024-07-31 (×2): 40 mg via ORAL
  Filled 2024-07-30 (×2): qty 2

## 2024-07-30 MED ORDER — SODIUM CHLORIDE 0.9 % IV SOLN: INTRAVENOUS | Status: DC

## 2024-07-30 MED ORDER — CLOPIDOGREL BISULFATE 75 MG PO TABS
75.0000 mg | ORAL_TABLET | Freq: Every day | ORAL | Status: DC
  Administered 2024-07-30: 75 mg via ORAL
  Filled 2024-07-30: qty 1

## 2024-07-30 MED ORDER — HEPARIN (PORCINE) IN NACL 1000-0.9 UT/500ML-% IV SOLN
INTRAVENOUS | Status: DC | PRN
  Administered 2024-07-30 (×2): 500 mL

## 2024-07-30 MED ORDER — NITROGLYCERIN 1 MG/10 ML FOR IR/CATH LAB
INTRA_ARTERIAL | Status: AC
  Filled 2024-07-30: qty 10

## 2024-07-30 MED ORDER — ALPRAZOLAM 0.25 MG PO TABS: 0.2500 mg | ORAL_TABLET | Freq: Four times a day (QID) | ORAL | Status: DC | PRN

## 2024-07-30 MED ORDER — TICAGRELOR 90 MG PO TABS
90.0000 mg | ORAL_TABLET | Freq: Two times a day (BID) | ORAL | 3 refills | Status: AC
  Filled 2024-07-30: qty 180, 90d supply, fill #0
  Filled 2024-07-30: qty 60, 30d supply, fill #0

## 2024-07-30 MED ORDER — FENTANYL CITRATE (PF) 100 MCG/2ML IJ SOLN
INTRAMUSCULAR | Status: AC
  Filled 2024-07-30: qty 2

## 2024-07-30 MED ORDER — SODIUM CHLORIDE 0.9% FLUSH
3.0000 mL | Freq: Two times a day (BID) | INTRAVENOUS | Status: DC
  Administered 2024-07-30 – 2024-07-31 (×2): 3 mL via INTRAVENOUS

## 2024-07-30 MED ORDER — FENTANYL CITRATE (PF) 100 MCG/2ML IJ SOLN
INTRAMUSCULAR | Status: DC | PRN
  Administered 2024-07-30: 25 ug via INTRAVENOUS

## 2024-07-30 MED ORDER — FREE WATER
500.0000 mL | Freq: Once | Status: AC
  Administered 2024-07-30: 500 mL via ORAL

## 2024-07-30 MED ORDER — VERAPAMIL HCL 2.5 MG/ML IV SOLN
INTRAVENOUS | Status: DC | PRN
  Administered 2024-07-30: 10 mL via INTRA_ARTERIAL

## 2024-07-30 MED ORDER — ENOXAPARIN SODIUM 40 MG/0.4ML IJ SOSY: 40.0000 mg | PREFILLED_SYRINGE | INTRAMUSCULAR | Status: DC

## 2024-07-30 MED ORDER — SODIUM CHLORIDE 0.9% FLUSH: 3.0000 mL | INTRAVENOUS | Status: DC | PRN

## 2024-07-30 MED ORDER — AMLODIPINE BESYLATE 10 MG PO TABS
10.0000 mg | ORAL_TABLET | Freq: Every day | ORAL | Status: DC
  Administered 2024-07-30 – 2024-07-31 (×2): 10 mg via ORAL
  Filled 2024-07-30: qty 2
  Filled 2024-07-30: qty 1

## 2024-07-30 MED ORDER — HEPARIN SODIUM (PORCINE) 1000 UNIT/ML IJ SOLN
INTRAMUSCULAR | Status: DC | PRN
  Administered 2024-07-30 (×2): 3500 [IU] via INTRAVENOUS

## 2024-07-30 MED ORDER — BUDESON-GLYCOPYRROL-FORMOTEROL 160-9-4.8 MCG/ACT IN AERO
2.0000 | INHALATION_SPRAY | Freq: Two times a day (BID) | RESPIRATORY_TRACT | Status: DC
  Administered 2024-07-30 – 2024-07-31 (×3): 2 via RESPIRATORY_TRACT
  Filled 2024-07-30: qty 5.9

## 2024-07-30 MED ORDER — TICAGRELOR 90 MG PO TABS
90.0000 mg | ORAL_TABLET | Freq: Two times a day (BID) | ORAL | Status: DC
  Administered 2024-07-30 – 2024-07-31 (×2): 90 mg via ORAL
  Filled 2024-07-30 (×2): qty 1

## 2024-07-30 MED ORDER — SODIUM CHLORIDE 0.9 % IV SOLN: 250.0000 mL | INTRAVENOUS | Status: DC | PRN

## 2024-07-30 MED ORDER — METOPROLOL TARTRATE 50 MG PO TABS
50.0000 mg | ORAL_TABLET | Freq: Two times a day (BID) | ORAL | Status: DC
  Administered 2024-07-30 – 2024-07-31 (×4): 50 mg via ORAL
  Filled 2024-07-30: qty 2
  Filled 2024-07-30: qty 1
  Filled 2024-07-30: qty 2
  Filled 2024-07-30: qty 1

## 2024-07-30 MED ORDER — IOHEXOL 350 MG/ML SOLN
INTRAVENOUS | Status: DC | PRN
  Administered 2024-07-30: 130 mL

## 2024-07-30 MED ORDER — VERAPAMIL HCL 2.5 MG/ML IV SOLN
INTRAVENOUS | Status: AC
  Filled 2024-07-30: qty 2

## 2024-07-30 MED ORDER — AMLODIPINE BESYLATE 5 MG PO TABS: 5.0000 mg | ORAL_TABLET | Freq: Every day | ORAL | Status: DC

## 2024-07-30 MED ORDER — LABETALOL HCL 5 MG/ML IV SOLN: 10.0000 mg | INTRAVENOUS | Status: AC | PRN

## 2024-07-30 MED ORDER — TICAGRELOR 90 MG PO TABS
ORAL_TABLET | ORAL | Status: DC | PRN
  Administered 2024-07-30: 180 mg via ORAL

## 2024-07-30 MED ADMIN — THERA M PLUS PO TABS: 1 | ORAL | NDC 1650058694

## 2024-07-30 MED ADMIN — Gabapentin Cap 300 MG: 300 mg | ORAL | NDC 60687059111

## 2024-07-30 MED ADMIN — Sodium Chloride IV Soln 0.9%: 10 mL/h | INTRAVENOUS | NDC 00338004904

## 2024-07-30 MED ADMIN — Gabapentin Cap 300 MG: 300 mg | ORAL | NDC 16714066202

## 2024-07-30 MED ADMIN — Midazolam HCl Inj PF 2 MG/2ML (Base Equivalent): 1 mg | INTRAVENOUS | NDC 00409000125

## 2024-07-30 MED FILL — Multiple Vitamin Tab: 1.0000 | ORAL | Qty: 1 | Status: AC

## 2024-07-30 MED FILL — Midazolam HCl Inj 2 MG/2ML (Base Equivalent): INTRAMUSCULAR | Qty: 2 | Status: AC

## 2024-07-30 MED FILL — Gabapentin Cap 300 MG: 300.0000 mg | ORAL | Qty: 1 | Status: AC

## 2024-07-30 MED FILL — Heparin Sodium (Porcine) Inj 1000 Unit/ML: INTRAMUSCULAR | Qty: 10 | Status: AC

## 2024-07-30 MED FILL — Ticagrelor Tab 90 MG: ORAL | Qty: 2 | Status: AC

## 2024-07-30 SURGICAL SUPPLY — 17 items
BALLOON EMERGE MR 2.5X12 (BALLOONS) IMPLANT
BALLOON ~~LOC~~ EMERGE MR 3.25X12 (BALLOONS) IMPLANT
CATH 5FR JL3.5 JR4 ANG PIG MP (CATHETERS) IMPLANT
CATH VISTA GUIDE 6FR XB3.5 EPK (CATHETERS) IMPLANT
DEVICE RAD COMP TR BAND LRG (VASCULAR PRODUCTS) IMPLANT
GLIDESHEATH SLEND SS 6F .021 (SHEATH) IMPLANT
GUIDEWIRE INQWIRE 1.5J.035X260 (WIRE) IMPLANT
KIT ENCORE 26 ADVANTAGE (KITS) IMPLANT
KIT ESSENTIALS PG (KITS) IMPLANT
KIT SINGLE USE MANIFOLD (KITS) IMPLANT
PACK CARDIAC CATHETERIZATION (CUSTOM PROCEDURE TRAY) ×1 IMPLANT
SET ATX-X65L (MISCELLANEOUS) IMPLANT
SHEATH PROBE COVER 6X72 (BAG) IMPLANT
STENT SYNERGY XD 3.0X16 (Permanent Stent) IMPLANT
TUBING CIL FLEX 10 FLL-RA (TUBING) IMPLANT
WIRE ASAHI PROWATER 180CM (WIRE) IMPLANT
WIRE HI TORQ WHISPER MS 190CM (WIRE) IMPLANT

## 2024-07-30 NOTE — Progress Notes (Addendum)
 ANTICOAGULATION CONSULT NOTE  Pharmacy Consult for Heparin  Indication: chest pain/ACS  Allergies  Allergen Reactions   Aspirin  Shortness Of Breath, Itching, Swelling and Other (See Comments)    Tongue swelling   Codeine Shortness Of Breath, Swelling and Rash   Levaquin [Levofloxacin In D5w] Other (See Comments)    Severe yeast reaction.    Patient Measurements:   Heparin  Dosing Weight: 65.5 kg  Vital Signs: Temp: 98 F (36.7 C) (10/17 0715) Temp Source: Oral (10/17 0715) BP: 191/81 (10/17 0700) Pulse Rate: 59 (10/17 0700)  Labs: Recent Labs    07/29/24 1548 07/29/24 1926 07/30/24 0355 07/30/24 0845  HGB 11.5*  --  11.0*  --   HCT 36.2  --  34.9*  --   PLT 151  --  140*  --   LABPROT  --  12.9 13.4  --   INR  --  0.9 1.0  --   HEPARINUNFRC  --   --   --  0.27*  CREATININE 1.21*  --   --  1.10*  TROPONINIHS 15 15  --   --     Estimated Creatinine Clearance: 40.1 mL/min (A) (by C-G formula based on SCr of 1.1 mg/dL (H)).   Medical History: Past Medical History:  Diagnosis Date   Anemia 09/20/2014   Asthma 05/06/2014   Bilateral leg edema 09/15/2020   Bradycardia 09/28/2023   CAD (coronary artery disease), moderate nonobstructive disease CT coronaries August 2024, mildly abnormal CT FFR very distal LAD and OM territory, medically managed 10/20/2023   COPD (chronic obstructive pulmonary disease) (HCC)    Critical lower limb ischemia (HCC) 01/08/2024   Daytime somnolence 09/15/2020   Dyslipidemia    GERD (gastroesophageal reflux disease)    GERD (gastroesophageal reflux disease)    High blood pressure    Hypercholesterolemia 09/20/2014   Hypokalemia    Medication management 09/15/2020   Murmur 09/15/2020   Muscle cramps    Osteoarthritis 09/20/2014   Other chest pain 09/15/2020   PAD (peripheral artery disease) 01/08/2024   Pain in both lower extremities 09/15/2020   Palpitations 08/06/2023   Peripheral vascular disease, unspecified 10/20/2023    Postural lightheadedness 08/18/2023   Precordial pain 09/15/2020   Presence of permanent cardiac pacemaker    Psoriasis    Sick sinus syndrome (HCC), symptomatic with bradycardia, s/p permanent pacemaker implant 09/29/2023 10/20/2023   Snoring 09/15/2020   SOB (shortness of breath) 09/15/2020   Symptomatic bradycardia 09/27/2023   Syncope and collapse 08/06/2023   Vitamin D deficiency 09/20/2014    Medications:  (Not in a hospital admission)  Scheduled:   amLODipine  10 mg Oral Daily   budesonide-glycopyrrolate-formoterol  2 puff Inhalation BID   clopidogrel   75 mg Oral Daily   gabapentin   300 mg Oral QHS   hydrALAZINE   10 mg Intravenous Once   metoprolol  tartrate  50 mg Oral BID   multivitamin with minerals  1 tablet Oral QPM   PARoxetine   40 mg Oral q morning   rosuvastatin   40 mg Oral Daily   Infusions:   heparin  800 Units/hr (07/30/24 0819)   PRN: acetaminophen , ALPRAZolam , ipratropium-albuterol , nitroGLYCERIN , ondansetron  (ZOFRAN ) IV  Assessment: 46 yof with a history of CAD, SSS s/p PPM, CKD, PAD s/p right iliofemoral endarterectomy and fem-pop bypass (3/25). Patient is presenting with chest pain. Heparin  per pharmacy consult placed for chest pain/ACS.  Patient is on warfarin prior to arrival. Warfarin has been on hold for past 4 days prior to planned aortogram and bilateral  lower extremity angiogram with possible left lower extremity intervention.  Heparin  level slightly subtherapeutic at 0.27 while on heparin  800u/hr. HgB 11.0 and PLTs 140.   Goal of Therapy:  Heparin  level 0.3-0.7 units/ml Monitor platelets by anticoagulation protocol: Yes   Plan:  Increase heparin  infusion at 850 units/hr Check anti-Xa level in 8 hours and daily while on heparin  Continue to monitor H&H and platelets  Powell Blush, PharmD, BCCCP  07/30/2024 10:11 AM ED Clinical Pharmacist -  [phone]

## 2024-07-30 NOTE — H&P (View-Only) (Signed)
  Progress Note  Patient Name: Tina George Date of Encounter: 07/30/2024 [facility] Cardiologist: None   Interval Summary   No events overnight. She is reporting some intermittent episodes of left sided shooting CP but nothing as significant as the night before. S/p IV hydral x 1 for BP.   Vital Signs Vitals:   07/30/24 0400 07/30/24 0425 07/30/24 0530 07/30/24 0715  BP: (!) 146/74  (!) 148/88   Pulse: 60  (!) 58   Resp: (!) 23  (!) 27   Temp:    98 F (36.7 C)  TempSrc:    Oral  SpO2: 96% 96% 96%    No intake or output data in the 24 hours ending 07/30/24 0809    07/29/2024    1:14 PM 07/20/2024   10:08 AM 06/08/2024    1:12 PM  Last 3 Weights  Weight (lbs) 144 lb 6.4 oz 144 lb 144 lb  Weight (kg) 65.499 kg 65.318 kg 65.318 kg      Telemetry/ECG  NSR - Personally Reviewed  Physical Exam  General: Well nourished, well developed, in no acute distress HEENT: Atraumatic, no JVD Cardiac: Normal S1, S2; RRR; no murmurs, rubs, or gallops Lungs:CTAB, no wheezing, rhonchi or rales  Abd: Soft, nontender, no hepatomegaly  Ext: No pulses on LLE Skin: Warm and dry, no rashes   Assessment & Plan  VALISA KARPEL is a 78 y.o. female with a hx of CAD medically managed, SSS s/p PPM (12/24), SVT on prior PPM checks, CKDIII, PAD s/p right iliofemoral endarterectomy and fem-pop bypass (3/25) presenting with CP c/f unstable angina. Trop 15 and 15 and ECG w/o ischemic changes.    #CP c/f unstable angina #Mod CAD on CT #SSS s/p PPM #PAD s/p iliofemoral endarterectomy and fem bypass - Presenting an episode of CP last night concerning for unstable angina.  - Cannot do aspirin  2/2 allergy - INR 1.0 - Cont hep gtt and Plavix  75 mg daily - Cont metop 50 BID, rosuvastatin  40, and sublingual nitroglycerin  as needed - Increase amlo to 10 mg given BP - Obtain TTE and PPM interrogation - Plan for LHC   For questions or updates, please contact [facility] Please  consult [email or web address] for contact info under  Signed, Tina VEAR Ren Donley, MD

## 2024-07-30 NOTE — Progress Notes (Addendum)
  Progress Note  Patient Name: Tina George Date of Encounter: 07/30/2024 [facility] Cardiologist: None   Interval Summary   No events overnight. She is reporting some intermittent episodes of left sided shooting CP but nothing as significant as the night before. S/p IV hydral x 1 for BP.   Vital Signs Vitals:   07/30/24 0400 07/30/24 0425 07/30/24 0530 07/30/24 0715  BP: (!) 146/74  (!) 148/88   Pulse: 60  (!) 58   Resp: (!) 23  (!) 27   Temp:    98 F (36.7 C)  TempSrc:    Oral  SpO2: 96% 96% 96%    No intake or output data in the 24 hours ending 07/30/24 0809    07/29/2024    1:14 PM 07/20/2024   10:08 AM 06/08/2024    1:12 PM  Last 3 Weights  Weight (lbs) 144 lb 6.4 oz 144 lb 144 lb  Weight (kg) 65.499 kg 65.318 kg 65.318 kg      Telemetry/ECG  NSR - Personally Reviewed  Physical Exam  General: Well nourished, well developed, in no acute distress HEENT: Atraumatic, no JVD Cardiac: Normal S1, S2; RRR; no murmurs, rubs, or gallops Lungs:CTAB, no wheezing, rhonchi or rales  Abd: Soft, nontender, no hepatomegaly  Ext: No pulses on LLE Skin: Warm and dry, no rashes   Assessment & Plan  SACRED ROA is a 78 y.o. female with a hx of CAD medically managed, SSS s/p PPM (12/24), SVT on prior PPM checks, CKDIII, PAD s/p right iliofemoral endarterectomy and fem-pop bypass (3/25) presenting with CP c/f unstable angina. Trop 15 and 15 and ECG w/o ischemic changes.    #CP c/f unstable angina #Mod CAD on CT #SSS s/p PPM #PAD s/p iliofemoral endarterectomy and fem bypass - Presenting an episode of CP last night concerning for unstable angina.  - Cannot do aspirin  2/2 allergy - INR 1.0 - Cont hep gtt and Plavix  75 mg daily - Cont metop 50 BID, rosuvastatin  40, and sublingual nitroglycerin  as needed - Increase amlo to 10 mg given BP - Obtain TTE and PPM interrogation - Plan for LHC  Addendum 5 PM - TTE with EF 60-65% and pacemaker interrogation w/o SVT -  Underwent LHC with PCI to Lcx and started on Brillanta 90 mg BID - Discussed with her vascular surgeon regarding her need for warfarin, and Dr. Magda said warfarin can be stopped.  - Can d/c in AM if no complication overnight.   For questions or updates, please contact [facility] Please consult [email or web address] for contact info under  Signed, Joelle VEAR Ren Donley, MD

## 2024-07-30 NOTE — Progress Notes (Signed)
 RN to bedside immediately after being made aware of bleeding from TR band site. Estimate loss of . TR band inflated to . RN held pressure above TR band for ten minutes. SBP in 70's. Provider paged. NS bolus administered. BP improved. SBP greater than 100. Patient initially felt lethargic, but after recovery patient states she is feel well.   After event, patient is A&Ox4, states she is feeling better, and able to ambulate to bathroom.   After the event right radial site appears bruised, purple, no hematoma. TR band remains inflated with of air.

## 2024-07-30 NOTE — Progress Notes (Signed)
 Paged at the bedside due to bleeding post cath today.  Reportedly TR band had come off and nurse estimates she had loss around 200+ milliliters of blood.  Blood pressure was briefly low in the 70 systolic and she was bolused with IV fluids with now systolics in the 100s.  She has had no further bleeding.  Vital signs have been stable for the last hour or so.  She has had no complaints of chest pain or shortness of breath.  Felt slightly lethargic during the moment but feeling well now.  EF is normal.  She has CBC scheduled for tomorrow.  Will follow-up on this.

## 2024-07-30 NOTE — Interval H&P Note (Signed)
 History and Physical Interval Note:  07/30/2024 1:59 PM  Tina George  has presented today for surgery, with the diagnosis of unstable angina.  The various methods of treatment have been discussed with the patient and family. After consideration of risks, benefits and other options for treatment, the patient has consented to  Procedure(s): LEFT HEART CATH AND CORONARY ANGIOGRAPHY (N/A) as a surgical intervention.  The patient's history has been reviewed, patient examined, no change in status, stable for surgery.  I have reviewed the patient's chart and labs.  Questions were answered to the patient's satisfaction.   Cath Lab Visit (complete for each Cath Lab visit)  Clinical Evaluation Leading to the Procedure:   ACS: Yes.    Non-ACS:    Anginal Classification: CCS III  Anti-ischemic medical therapy: Minimal Therapy (1 class of medications)  Non-Invasive Test Results: No non-invasive testing performed  Prior CABG: No previous CABG        [facility] 07/30/2024 1:59 PM

## 2024-07-30 NOTE — ED Notes (Signed)
 Pacemaker interrogated and data has been sent at this time. 1(800)-CARDIAC states to wait approximately 15 minutes for auto fax and if nothing has been sent after that time then to recontact.

## 2024-07-31 ENCOUNTER — Other Ambulatory Visit (HOSPITAL_COMMUNITY): Payer: Self-pay

## 2024-07-31 DIAGNOSIS — I200 Unstable angina: Secondary | ICD-10-CM | POA: Diagnosis not present

## 2024-07-31 LAB — BASIC METABOLIC PANEL WITH GFR
Anion gap: 9 (ref 5–15)
BUN: 13 mg/dL (ref 8–23)
CO2: 24 mmol/L (ref 22–32)
Calcium: 8.6 mg/dL — ABNORMAL LOW (ref 8.9–10.3)
Chloride: 108 mmol/L (ref 98–111)
Creatinine, Ser: 1.23 mg/dL — ABNORMAL HIGH (ref 0.44–1.00)
GFR, Estimated: 45 mL/min — ABNORMAL LOW (ref >60)
Glucose, Bld: 103 mg/dL — ABNORMAL HIGH (ref 70–99)
Potassium: 3.7 mmol/L (ref 3.5–5.1)
Sodium: 141 mmol/L (ref 135–145)

## 2024-07-31 LAB — CBC
HCT: 32.9 % — ABNORMAL LOW (ref 36.0–46.0)
Hemoglobin: 10.5 g/dL — ABNORMAL LOW (ref 12.0–15.0)
MCH: 31.3 pg (ref 26.0–34.0)
MCHC: 31.9 g/dL (ref 30.0–36.0)
MCV: 98.2 fL (ref 80.0–100.0)
Platelets: 133 K/uL — ABNORMAL LOW (ref 150–400)
RBC: 3.35 MIL/uL — ABNORMAL LOW (ref 3.87–5.11)
RDW: 15.1 % (ref 11.5–15.5)
WBC: 5.8 K/uL (ref 4.0–10.5)
nRBC: 0 % (ref 0.0–0.2)

## 2024-07-31 MED ORDER — AMLODIPINE BESYLATE 10 MG PO TABS
10.0000 mg | ORAL_TABLET | Freq: Every day | ORAL | 1 refills | Status: AC
  Filled 2024-07-31: qty 90, 90d supply, fill #0

## 2024-07-31 MED ORDER — METOPROLOL TARTRATE 50 MG PO TABS
50.0000 mg | ORAL_TABLET | Freq: Two times a day (BID) | ORAL | 1 refills | Status: AC
  Filled 2024-07-31: qty 60, 30d supply, fill #0

## 2024-07-31 NOTE — Progress Notes (Signed)
 CARDIAC REHAB PHASE I   PRE:  Rate/Rhythm: 60 paced   BP:  Supine: 129/61     SaO2: 97RA  MODE:  Ambulation: 75 ft   POST:  Rate/Rhythm: 66 paced  BP:  Sitting: 130/68       SaO2: 95RA   Pt tolerated exercise fair and AMB 75 ft with front wheel Knolan Simien, and standby assist. Pt had no rest break, chest pain or pain. She did have exertional SOB with short walk (97%SaO2 on walk), resolved with pursed lip breathing, she does have COPD. Education given to pt on heart healthy diet, radial/femoral weight restrictions, adherence to NTG, Brilinta and ASA.  Home exercise guidelines given and will refer to cardiac rehab phase 2 in [place], she does sound interested. Pt left in the bed with call bell in reach. All questions were answered and pt verbalized understanding.  Alec GORMAN Finder ACSM-CEP 07/31/2024 10:33 AM

## 2024-07-31 NOTE — Progress Notes (Signed)
 Right radial site assessment: -no hematoma -purple bruising noted -site soft to palpate and tender to touch -right radial pulse +2 -Distal extremity warm with positive cap refill (less than 3 sec.) -dressing changed (gauze and Tegaderm)  Extensive patient education provided to patient regarding right radial site care and use. Patient agreeable and states understanding to education.

## 2024-07-31 NOTE — Plan of Care (Signed)

## 2024-07-31 NOTE — Discharge Summary (Signed)
 Discharge Summary   Patient ID: Tina George MRN: [id number]; DOB: Oct 24, 1945  Admit date: 07/29/2024 Discharge date: 07/31/2024  PCP:  Jama Chow, MD   [facility] Providers Cardiologist:  Joelle VEAR Ren Donley, MD  Electrophysiologist:  OLE ONEIDA HOLTS, MD    Discharge Diagnoses  Principal Problem:   Unstable angina [facility]) Active Problems:   Dyslipidemia   High blood pressure   PAD (peripheral artery disease)   Diagnostic Studies/Procedures   Cath: 07/30/2024    Prox RCA lesion is 40% stenosed.   Prox RCA to Mid RCA lesion is 50% stenosed.   Mid LAD to Dist LAD lesion is 50% stenosed.   Prox LAD to Mid LAD lesion is 30% stenosed.   1st Mrg lesion is 90% stenosed.   A drug-eluting stent was successfully placed using a STENT SYNERGY XD 3.0X16.   Post intervention, there is a 0% residual stenosis.   The left ventricular systolic function is normal.   LV end diastolic pressure is mildly elevated.   The left ventricular ejection fraction is 55-65% by visual estimate.   Brilinta monotherapy 90 mg bid for one year.   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 Since she is ASA allergic would recommend Brilinta monotherapy for more potent platelet inhibition. Not a candidate for Effient due to age. Would not recommend Plavix  as monotherapy  Diagnostic Dominance: Right  Intervention   _____________   History of Present Illness   Tina George is a 78 y.o. female with 78 year old female with past medical history of CAD, sick sinus syndrome status post PPM (09/2023), SVT, CKD stage III, PAD status post right iliofemoral endarterectomy and femoropopliteal bypass (12/2023) who presented with chest pain concerning for unstable angina.  Presented to the ED on 10/16 with severe left-sided chest pain that radiated to her back with  associated dyspnea the night prior.  Took 2 sublingual nitroglycerin  with resolution of her symptoms.  Had return of pain the following morning and ultimately presented to the ED for further evaluation.   Hospital Course    Unstable angina --Presented with complaints of chest pain, high-sensitivity troponin 15>>15 -- Underwent cardiac catheterization 10/17 with single-vessel obstructive disease involving large circumflex/OM1 with successful PCI/DES x 1.  Given her allergy to aspirin , it was recommended that she be on Brilinta monotherapy for at least 1 year.  Not a candidate for Effient secondary to her age.  LV gram with visual estimate of LVEF 55% -- Echo with LVEF of 60 to 65%, no regional wall motion abnormality, normal RV function, no significant valvular disease -- Continue Brilinta 90 mg twice daily, Crestor  40 mg daily, metoprolol  tartrate 50 mg twice daily, amlodipine 10 mg daily  Hypertension -- Elevated on admission, improved with titration of therapy -- Continue amlodipine 10 mg daily, metoprolol  tartrate 50 mg twice daily  Hyperlipidemia -- Continue Crestor  40 mg daily -- Will need outpatient lipid panel  PAD s/p iliofemoral endarterectomy and fem bypass 3/25 -- Previously on Plavix  and Coumadin  per [facility] -- Case discussed with vascular surgeon this admission and cleared to stop Coumadin  at this time  SSS s/p PPM -- no SVT episodes on device interrogation  -- per EP  Patient was seen by Dr. Floretta and deemed stable for discharge home.  Follow-up arranged in the office.  Medications sent to [facility] pharmacy.  Did the patient have an acute coronary syndrome (MI, NSTEMI, STEMI, etc) this admission?:  Yes                               AHA/ACC ACS Clinical Performance & Quality Measures: Aspirin  prescribed? - No - allergic ADP Receptor Inhibitor (Plavix /Clopidogrel , Brilinta/Ticagrelor or Effient/Prasugrel) prescribed (includes medically managed patients)? - Yes Beta Blocker  prescribed? - Yes High Intensity Statin (Lipitor 40-80mg  or Crestor  20-40mg ) prescribed? - Yes EF assessed during THIS hospitalization? - Yes For EF <40%, was ACEI/ARB prescribed? - Not Applicable (EF >/= 40%) For EF <40%, Aldosterone Antagonist (Spironolactone or Eplerenone) prescribed? - Not Applicable (EF >/= 40%) Cardiac Rehab Phase II ordered (including medically managed patients)? - Yes       The patient will be scheduled for a TOC follow up appointment in 10-14 days.  A message has been sent to the [facility] and Scheduling Pool at the office where the patient should be seen for follow up.  _____________  Discharge Vitals Blood pressure (!) 142/83, pulse 61, temperature 98.1 F (36.7 C), temperature source Oral, resp. rate 15, height 5' 5.98 (1.676 m), weight 66 kg, SpO2 95%.  Filed Weights   07/30/24 1524  Weight: 66 kg    Labs & Radiologic Studies  CBC Recent Labs    07/30/24 0355 07/31/24 0508  WBC 6.1 5.8  HGB 11.0* 10.5*  HCT 34.9* 32.9*  MCV 97.8 98.2  PLT 140* 133*   Basic Metabolic Panel Recent Labs    89/82/74 0845 07/31/24 0508  NA 140 141  K 3.2* 3.7  CL 106 108  CO2 23 24  GLUCOSE 88 103*  BUN 11 13  CREATININE 1.10* 1.23*  CALCIUM  8.9 8.6*   Liver Function Tests No results for input(s): AST, ALT, ALKPHOS, BILITOT, PROT, ALBUMIN in the last 72 hours. No results for input(s): LIPASE, AMYLASE in the last 72 hours. High Sensitivity Troponin:   Recent Labs  Lab 07/29/24 1548 07/29/24 1926  TROPONINIHS 15 15    No results for input(s): TRNPT in the last 720 hours.  BNP Invalid input(s): POCBNP No results for input(s): PROBNP in the last 72 hours.  No results for input(s): BNP in the last 72 hours.  D-Dimer No results for input(s): DDIMER in the last 72 hours. Hemoglobin A1C No results for input(s): HGBA1C in the last 72 hours. Fasting Lipid Panel No results for input(s): CHOL, HDL, LDLCALC, TRIG,  CHOLHDL, LDLDIRECT in the last 72 hours. No results found for: LIPOA  Thyroid  Function Tests No results for input(s): TSH, T4TOTAL, T3FREE, THYROIDAB in the last 72 hours.  Invalid input(s): FREET3 _____________  CARDIAC CATHETERIZATION Result Date: 07/30/2024   Prox RCA lesion is 40% stenosed.   Prox RCA to Mid RCA lesion is 50% stenosed.   Mid LAD to Dist LAD lesion is 50% stenosed.   Prox LAD to Mid LAD lesion is 30% stenosed.   1st Mrg lesion is 90% stenosed.   A drug-eluting stent was successfully placed using a STENT SYNERGY XD 3.0X16.   Post intervention, there is a 0% residual stenosis.   The left ventricular systolic function is normal.   LV end diastolic pressure is mildly elevated.   The left ventricular ejection fraction is 55-65% by visual estimate.   Brilinta monotherapy 90 mg bid for one year. 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 Since she is ASA allergic would recommend Brilinta monotherapy for more potent platelet inhibition. Not a candidate for Effient due to age. Would not recommend Plavix  as monotherapy   ECHOCARDIOGRAM COMPLETE BUBBLE STUDY Result Date: 07/30/2024    ECHOCARDIOGRAM REPORT   Patient Name:   Tina George Date of Exam: 07/30/2024 Medical Rec #:  [id number]      Height:       66.0 in Accession #:    [id number]     Weight:       144.4 lb Date of Birth:  Aug 21, 1946      BSA:          1.741 m Patient Age:    78 years       BP:           147/76 mmHg Patient Gender: F              HR:           60 bpm. Exam Location:  Inpatient Procedure: 2D Echo, Saline Contrast Bubble Study, Cardiac Doppler and Color            Doppler (Both Spectral and Color Flow Doppler were utilized during            procedure). Indications:    Coronary artery disease  History:        Patient has prior history of Echocardiogram examinations, most                  recent 08/27/2023. CAD, COPD and PAD, Signs/Symptoms:Shortness                 of Breath and Syncope; Risk Factors:Dyslipidemia.  Sonographer:    Philomena Daring Referring Phys: [id number] FRANCK H AZOBOU TONLEU IMPRESSIONS  1. Left ventricular ejection fraction, by estimation, is 60 to 65%. The left ventricle has normal function. The left ventricle has no regional wall motion abnormalities. Left ventricular diastolic parameters were normal.  2. Right ventricular systolic function is normal. The right ventricular size is normal.  3. The mitral valve is normal in structure. No evidence of mitral valve regurgitation. No evidence of mitral stenosis.  4. The aortic valve is tricuspid. Aortic valve regurgitation is not visualized. Aortic valve sclerosis is present, with no evidence of aortic valve stenosis.  5. The inferior vena cava is normal in size with greater than 50% respiratory variability, suggesting right atrial pressure of 3 mmHg.  6. Agitated saline contrast bubble study was negative, with no evidence of any interatrial shunt. Comparison(s): A prior study was performed on 08/27/2023. No significant change from prior study. FINDINGS  Left Ventricle: Left ventricular ejection fraction, by estimation, is 60 to 65%. The left ventricle has normal function. The left ventricle has no regional wall motion abnormalities. The left ventricular internal cavity size was normal in size. There is  borderline left ventricular hypertrophy. Concentric remodeling. Left ventricular diastolic parameters were normal. Right Ventricle: The right ventricular size is normal. No increase in right ventricular wall thickness. Right ventricular systolic function is normal. Left Atrium: Left atrial size was normal in size. Right Atrium: Right atrial size was normal in size. Pericardium: There is no evidence of pericardial effusion. Presence of epicardial fat layer. Mitral Valve: The mitral valve is normal in structure. There is mild  thickening of the mitral valve leaflet(s). Mild mitral annular calcification. No evidence of mitral valve regurgitation. No evidence of mitral  valve stenosis. Tricuspid Valve: The tricuspid valve is normal in structure. Tricuspid valve regurgitation is trivial. No evidence of tricuspid stenosis. Aortic Valve: The aortic valve is tricuspid. Aortic valve regurgitation is not visualized. Aortic valve sclerosis is present, with no evidence of aortic valve stenosis. Pulmonic Valve: The pulmonic valve was normal in structure. Pulmonic valve regurgitation is trivial. No evidence of pulmonic stenosis. Aorta: The aortic root and ascending aorta are structurally normal, with no evidence of dilitation. Venous: The inferior vena cava is normal in size with greater than 50% respiratory variability, suggesting right atrial pressure of 3 mmHg. IAS/Shunts: No atrial level shunt detected by color flow Doppler. Agitated saline contrast was given intravenously to evaluate for intracardiac shunting. Agitated saline contrast bubble study was negative, with no evidence of any interatrial shunt. Additional Comments: A device lead is visualized.  LEFT VENTRICLE PLAX 2D LVIDd:         3.32 cm   Diastology LVIDs:         2.26 cm   LV e' medial:    7.18 cm/s LV PW:         1.21 cm   LV E/e' medial:  14.1 LV IVS:        1.39 cm   LV e' lateral:   7.62 cm/s LVOT diam:     1.73 cm   LV E/e' lateral: 13.3 LV SV:         60 LV SV Index:   34 LVOT Area:     2.35 cm  RIGHT VENTRICLE             IVC RV Basal diam:  2.07 cm     IVC diam: 1.67 cm RV Mid diam:    1.52 cm RV S prime:     10.70 cm/s TAPSE (M-mode): 1.7 cm LEFT ATRIUM             Index        RIGHT ATRIUM          Index LA diam:        2.62 cm 1.50 cm/m   RA Area:     7.38 cm LA Vol (A2C):   16.1 ml 9.25 ml/m   RA Volume:   10.20 ml 5.86 ml/m LA Vol (A4C):   18.6 ml 10.68 ml/m LA Biplane Vol: 19.0 ml 10.91 ml/m  AORTIC VALVE LVOT Vmax:   104.00 cm/s LVOT Vmean:  67.100 cm/s LVOT  VTI:    0.254 m  AORTA Ao Root diam: 2.61 cm Ao Asc diam:  2.93 cm MITRAL VALVE MV Area (PHT): 4.10 cm     SHUNTS MV Decel Time: 185 msec     Systemic VTI:  0.25 m MV E velocity: 101.00 cm/s  Systemic Diam: 1.73 cm MV A velocity: 96.90 cm/s MV E/A ratio:  1.04 Emeline Calender Electronically signed by Emeline Calender Signature Date/Time: 07/30/2024/1:11:55 PM    Final    DG Chest 2 View Result Date: 07/29/2024 CLINICAL DATA:  Chest pain. EXAM: CHEST - 2 VIEW COMPARISON:  September 30, 2023 FINDINGS: There is stable dual lead AICD positioning. The heart size and mediastinal contours are within normal limits. Marked severity calcification of the aortic arch is seen. No acute infiltrate, pleural effusion or pneumothorax is identified. Partially calcified bilateral breast implants are present. Multilevel degenerative changes are seen throughout the thoracic spine. IMPRESSION: No active cardiopulmonary disease. Electronically Signed   By: Suzen Dials M.D.   On: 07/29/2024 17:50   VAS  US  ABI WITH/WO TBI Result Date: 07/20/2024  LOWER EXTREMITY DOPPLER STUDY Patient Name:  Tina George  Date of Exam:   07/20/2024 Medical Rec #: [id number]       Accession #:    [id number] Date of Birth: Apr 22, 1946       Patient Gender: F Patient Age:   72 years Exam Location:  [address] Procedure:      VAS US  ABI WITH/WO TBI Referring Phys: DEBBY ROBERTSON --------------------------------------------------------------------------------  Indications: Peripheral artery disease. Patient called reporting left toes and              foot are turning purple and black, rest pain in left foot and              increased cramping in left calf.              Patient also reporting increased swelling. High Risk Factors: Hypertension, hyperlipidemia, current smoker, coronary artery                    disease. Other Factors: S/p Right t iliofemoral endarterectomy and bovine pericardial                patch angioplasty and right common femoral to  below-knee                popliteal artery bypass with 6 mm externally supported PTFE and                subfascial plane 01/08/2024 by Dr. ROBERTSON for rest pain and                ulceration.  Performing Technologist: [employer] BS, RVT, RDCS  Examination Guidelines: A complete evaluation includes at minimum, Doppler waveform signals and systolic blood pressure reading at the level of bilateral brachial, anterior tibial, and posterior tibial arteries, when vessel segments are accessible. Bilateral testing is considered an integral part of a complete examination. Photoelectric Plethysmograph (PPG) waveforms and toe systolic pressure readings are included as required and additional duplex testing as needed. Limited examinations for reoccurring indications may be performed as noted.  ABI Findings: +---------+------------------+-----+--------+--------+ Right    Rt Pressure (mmHg)IndexWaveformComment  +---------+------------------+-----+--------+--------+ Brachial 164                                     +---------+------------------+-----+--------+--------+ ATA      178               0.98                  +---------+------------------+-----+--------+--------+ PTA      167               0.92                  +---------+------------------+-----+--------+--------+ Great Toe143               0.79                  +---------+------------------+-----+--------+--------+ +---------+------------------+-----+--------+-------+ Left     Lt Pressure (mmHg)IndexWaveformComment +---------+------------------+-----+--------+-------+ Brachial 182                                    +---------+------------------+-----+--------+-------+ ATA      102  0.56                 +---------+------------------+-----+--------+-------+ PTA      107               0.59                 +---------+------------------+-----+--------+-------+ Great Toe75                0.41                  +---------+------------------+-----+--------+-------+ +-------+-----------+-----------+------------+------------+ ABI/TBIToday's ABIToday's TBIPrevious ABIPrevious TBI +-------+-----------+-----------+------------+------------+ Right  .98        .79        1.03        .71          +-------+-----------+-----------+------------+------------+ Left   .59        .41        .61         .31          +-------+-----------+-----------+------------+------------+  Bilateral ABIs and TBIs appear essentially unchanged compared to prior study on 06/09/24.  Summary: Right: Resting right ankle-brachial index is within normal range. The right toe-brachial index is normal.  Left: Resting left ankle-brachial index indicates moderate left lower extremity arterial disease. Left toe pressure is >60 mmHg which suggests adequate perfusion for healing. *See table(s) above for measurements and observations.  Electronically signed by Debby Robertson on 07/20/2024 at 10:08:07 AM.    Final     Disposition Pt is being discharged home today in good condition.  Follow-up Plans & Appointments  Discharge Instructions     AMB Referral to Cardiac Rehabilitation - Phase II   Complete by: As directed    Diagnosis: Coronary Stents   After initial evaluation and assessments completed: Virtual Based Care may be provided alone or in conjunction with Phase 2 Cardiac Rehab based on patient barriers.: Yes   Intensive Cardiac Rehabilitation (ICR) [facility] location only OR Traditional Cardiac Rehabilitation (TCR) *If criteria for ICR are not met will enroll in TCR [facility] only): Yes   Call MD for:  difficulty breathing, headache or visual disturbances   Complete by: As directed    Call MD for:  persistant dizziness or light-headedness   Complete by: As directed    Call MD for:  redness, tenderness, or signs of infection (pain, swelling, redness, odor or green/yellow discharge around incision site)   Complete by: As directed    Diet  - low sodium heart healthy   Complete by: As directed    Discharge instructions   Complete by: As directed    Radial Site Care Refer to this sheet in the next few weeks. These instructions provide you with information on caring for yourself after your procedure. Your caregiver may also give you more specific instructions. Your treatment has been planned according to current medical practices, but problems sometimes occur. Call your caregiver if you have any problems or questions after your procedure. HOME CARE INSTRUCTIONS You may shower the day after the procedure. Remove the bandage (dressing) and gently wash the site with plain soap and water. Gently pat the site dry.  Do not apply powder or lotion to the site.  Do not submerge the affected site in water for 3 to 5 days.  Inspect the site at least twice daily.  Do not flex or bend the affected arm for 24 hours.  No lifting over 5 pounds (2.3 kg) for 5 days after your procedure.  Do not drive home if you are discharged the same day of the procedure. Have someone else drive you.  You may drive 24 hours after the procedure unless otherwise instructed by your caregiver.  What to expect: Any bruising will usually fade within 1 to 2 weeks.  Blood that collects in the tissue (hematoma) may be painful to the touch. It should usually decrease in size and tenderness within 1 to 2 weeks.  SEEK IMMEDIATE MEDICAL CARE IF: You have unusual pain at the radial site.  You have redness, warmth, swelling, or pain at the radial site.  You have drainage (other than a small amount of blood on the dressing).  You have chills.  You have a fever or persistent symptoms for more than 72 hours.  You have a fever and your symptoms suddenly get worse.  Your arm becomes pale, cool, tingly, or numb.  You have heavy bleeding from the site. Hold pressure on the site.   PLEASE DO NOT MISS ANY DOSES OF YOUR BRILINTA!!!!! Also keep a log of you blood pressures and bring  back to your follow up appt. Please call the office with any questions.   Patients taking blood thinners should generally stay away from medicines like ibuprofen , Advil , Motrin , naproxen, and Aleve due to risk of stomach bleeding. You may take Tylenol  as directed or talk to your primary doctor about alternatives.    PLEASE ENSURE THAT YOU DO NOT RUN OUT OF YOUR BRILINTA. This medication is very important to remain on for at least one year. IF you have issues obtaining this medication due to cost please CALL the office 3-5 business days prior to running out in order to prevent missing doses of this medication.   Increase activity slowly   Complete by: As directed        Discharge Medications Allergies as of 07/31/2024       Reactions   Aspirin  Shortness Of Breath, Itching, Swelling, Other (See Comments)   Tongue swelling   Codeine Shortness Of Breath, Swelling, Rash   Levaquin [levofloxacin In D5w] Other (See Comments)   Severe yeast reaction.        Medication List     STOP taking these medications    warfarin 5 MG tablet Commonly known as: COUMADIN        TAKE these medications    acetaminophen  500 MG tablet Commonly known as: TYLENOL  Take 500-1,000 mg by mouth every 6 (six) hours as needed for mild pain (pain score 1-3), moderate pain (pain score 4-6) or headache.   albuterol  108 (90 Base) MCG/ACT inhaler Commonly known as: VENTOLIN  HFA Inhale 1-2 puffs into the lungs as needed for wheezing or shortness of breath.   ALPRAZolam  0.25 MG tablet Commonly known as: XANAX  Take 0.25 mg by mouth at bedtime.   amLODipine 10 MG tablet Commonly known as: NORVASC Take 1 tablet (10 mg total) by mouth daily. What changed:  medication strength how much to take   budesonide-glycopyrrolate-formoterol 160-9-4.8 MCG/ACT Aero inhaler Commonly known as: BREZTRI  Inhale 2 puffs into the lungs 2 (two) times daily.   furosemide  40 MG tablet Commonly known as: LASIX  Take 40 mg  by mouth 2 (two) times daily as needed for fluid.   gabapentin  300 MG capsule Commonly known as: NEURONTIN  Take 300 mg by mouth at bedtime.   metoprolol  tartrate 50 MG tablet Commonly known as: LOPRESSOR  Take 1 tablet (50 mg total) by mouth 2 (two) times daily.   multivitamin with minerals Tabs tablet  Take 1 tablet by mouth every evening. Multivitamin for Women   nicotine  21 mg/24hr patch Commonly known as: NICODERM CQ  - dosed in mg/24 hours Place 21 mg onto the skin daily.   nitroGLYCERIN  0.4 MG SL tablet Commonly known as: NITROSTAT  Place 1 tablet (0.4 mg total) under the tongue every 5 (five) minutes as needed for chest pain.   PARoxetine  40 MG tablet Commonly known as: PAXIL  Take 40 mg by mouth every morning.   rosuvastatin  40 MG tablet Commonly known as: CRESTOR  Take 1 tablet (40 mg total) by mouth daily.   ticagrelor 90 MG Tabs tablet Commonly known as: Brilinta Take 1 tablet (90 mg total) by mouth 2 (two) times daily.   traMADol 50 MG tablet Commonly known as: ULTRAM Take 50 mg by mouth 3 (three) times daily as needed for moderate pain (pain score 4-6).         Outstanding Labs/Studies  Lipid panel  Duration of Discharge Encounter: APP Time: 20 minutes   Signed, Manuelita Rummer, NP 07/31/2024, 8:52 AM

## 2024-08-01 ENCOUNTER — Encounter (HOSPITAL_COMMUNITY): Payer: Self-pay | Admitting: Cardiology

## 2024-08-02 LAB — LIPOPROTEIN A (LPA): Lipoprotein (a): 36 nmol/L — ABNORMAL HIGH (ref <75.0)

## 2024-08-02 MED FILL — Nitroglycerin IV Soln 100 MCG/ML in D5W: INTRA_ARTERIAL | Qty: 10 | Status: AC

## 2024-08-04 ENCOUNTER — Telehealth (HOSPITAL_COMMUNITY): Payer: Self-pay

## 2024-08-04 ENCOUNTER — Other Ambulatory Visit: Payer: Self-pay

## 2024-08-04 NOTE — Telephone Encounter (Signed)
Per phase I cardiac rehab, fax referral to [place].

## 2024-08-09 ENCOUNTER — Ambulatory Visit

## 2024-08-09 VITALS — BP 130/72 | HR 74 | Resp 97 | Ht 67.0 in | Wt 148.4 lb

## 2024-08-09 DIAGNOSIS — I2510 Atherosclerotic heart disease of native coronary artery without angina pectoris: Secondary | ICD-10-CM | POA: Insufficient documentation

## 2024-08-09 DIAGNOSIS — E7800 Pure hypercholesterolemia, unspecified: Secondary | ICD-10-CM | POA: Diagnosis not present

## 2024-08-09 MED ORDER — ASPIRIN 81 MG PO TBEC: 81.0000 mg | DELAYED_RELEASE_TABLET | Freq: Every day | ORAL | Status: AC

## 2024-08-09 NOTE — Assessment & Plan Note (Signed)
 Continue rosuvastatin  40 mg once daily. Last lipid panel September 2025 was at goal. Will reassess in 6 months at follow-up.

## 2024-08-09 NOTE — Patient Instructions (Signed)
 Medication Instructions:  Your physician has recommended you make the following change in your medication:   Start taking 81 mg coated aspirin  daily  *If you need a refill on your cardiac medications before your next appointment, please call your pharmacy*   Lab Work: None ordered If you have labs (blood work) drawn today and your tests are completely normal, you will receive your results only by: MyChart Message (if you have MyChart) OR A paper copy in the mail If you have any lab test that is abnormal or we need to change your treatment, we will call you to review the results.   Testing/Procedures: None ordered   Follow-Up: At [facility], you and your health needs are our priority.  As part of our continuing mission to provide you with exceptional heart care, we have created designated Provider Care Teams.  These Care Teams include your primary Cardiologist (physician) and Advanced Practice Providers (APPs -  Physician Assistants and Nurse Practitioners) who all work together to provide you with the care you need, when you need it.  We recommend signing up for the patient portal called MyChart.  Sign up information is provided on this After Visit Summary.  MyChart is used to connect with patients for Virtual Visits (Telemedicine).  Patients are able to view lab/test results, encounter notes, upcoming appointments, etc.  Non-urgent messages can be sent to your provider as well.   To learn more about what you can do with MyChart, go to [email or web address].    Your next appointment:   3 month(s)  The format for your next appointment:   In Person  Provider:   Alean Kobus, MD    Other Instructions none  Important Information About Sugar

## 2024-08-09 NOTE — Assessment & Plan Note (Signed)
 Was discharged on monotherapy with Brilinta 90 mg twice daily due to concerns about potential aspirin  allergy. She mentions her aspirin  use concern previously was mostly related to peptic ulcer disease concerns rather than a true allergy. She mentions aspirin  81 mg once daily was tolerated without significant symptoms in the past.  She is agreeable to start taking aspirin  81 mg and if runs into any issues will let us  know promptly and discontinue the medication.  Continue Brilinta 90 mg twice daily Trial aspirin  81 mg once daily. Continue rosuvastatin  40 mg once daily. Continue metoprolol  tartrate 50 mg twice daily.

## 2024-08-09 NOTE — Progress Notes (Signed)
 Cardiology Consultation:    Date:  08/09/2024   ID:  Tina George, DOB 1946/09/02, MRN [id number]  PCP:  Jama Chow, MD  Cardiologist:  Alean SAUNDERS Kalin Amrhein, MD   Referring MD: Jama Chow, MD   No chief complaint on file.    ASSESSMENT AND PLAN:   Ms. Blakley 78 year old woman history of CAD with recent unstable angina s/p PCI of LCx/OM1 and LVEDP elevated mildly 18 mmHg 07/30/2024, normal biventricular function on echocardiogram without significant wall motion abnormality or valve abnormalities.  Also has history of sick sinus syndrome with symptomatic bradycardia and poor heart rate response s/p permanent pacemaker implant 09/29/2023,  hypertension, hyperlipidemia, CKD stage III, smokes tobacco [quit March 2025], peripheral neuropathy, benign essential tremor, asthma/COPD, chronic venous insufficiency. peripheral arterial disease follows up with vascular surgeon Dr. Magda being planned for aortogram and bilateral lower extremity angiogram with possible left lower extremity intervention [has follow-up visits with them rescheduled for next week].  Here for follow-up visit after cardiac cath and PCI. Doing well. Problem List Items Addressed This Visit     Hypercholesterolemia   Continue rosuvastatin  40 mg once daily. Last lipid panel September 2025 was at goal. Will reassess in 6 months at follow-up.       Relevant Medications   aspirin  EC 81 MG tablet   CAD, s/p PCI of LCX 07/30/2024 - Primary   Was discharged on monotherapy with Brilinta 90 mg twice daily due to concerns about potential aspirin  allergy. She mentions her aspirin  use concern previously was mostly related to peptic ulcer disease concerns rather than a true allergy. She mentions aspirin  81 mg once daily was tolerated without significant symptoms in the past.  She is agreeable to start taking aspirin  81 mg and if runs into any issues will let us  know promptly and discontinue the medication.  Continue Brilinta  90 mg twice daily Trial aspirin  81 mg once daily. Continue rosuvastatin  40 mg once daily. Continue metoprolol  tartrate 50 mg twice daily.       Relevant Medications   aspirin  EC 81 MG tablet    Return to clinic tentatively in 3 months.  History of Present Illness:    Tina George is a 78 y.o. female who is being seen today for follow-up visit after recent discharge from the hospital where she was treated for unstable angina and underwent cardiac cath and PCI of LCx/OM1 07/30/2024.SABRA PCP is Jama Chow, MD.   History of CAD [previously cardiac CT August 2024 moderate disease in LAD and OM, very distal flow dampened, medically managed], recently with chest pain unstable angina like symptoms at office visit sent to ER and underwent cath 07/30/2024 and PCI of LCx/OM1.  Normal biventricular function on echocardiogram 07/30/2024 without significant valve abnormality. Also has history of sick sinus syndrome with symptomatic bradycardia and poor heart rate response s/p permanent pacemaker implant 09/29/2023,  hypertension, hyperlipidemia, CKD stage III, smokes tobacco [quit March 2025], peripheral neuropathy, benign essential tremor, asthma/COPD, chronic venous insufficiency. peripheral arterial disease follows up with vascular surgeon Dr. Magda being planned for aortogram and bilateral lower extremity angiogram with possible left lower extremity intervention now had to be rescheduled given her admission for unstable angina to the hospital. There is aspirin  listed on her allergy list however and mentions it was more of an intolerance to full-strength aspirin  related to her history of peptic ulcer disease.  Her mother had allergy and throat swelling with aspirin . Her previous medication warfarin was discontinued after discussing with vascular  surgeon during inpatient stay.  Mentions overall she has been doing well. She did report significant bleeding from right radial cath access site in recovery  post cardiac cath/PCI and reports she lost significant amounts of blood before bleeding was controlled with readjusting the TR band.  She did feel weak and required normal saline bolus. No further bleeding from the site.  Still a little sore on her right wrist at the Access site.  Denies any chest pain similar to what prompted her being sent to the ER from our office 10 days ago. Reports intermittent atypical sharp chest pain lasting for a second or 2 occurs once every day or every other day.  No sustained episodes. No shortness of breath, orthopnea or paroxysmal nocturnal dyspnea.  No pedal edema.  Right radial access site pain is slowly easing up. No blood in urine or stools.  Last pacemaker check 06/29/2024 showed stable function, short runs of supraventricular tachycardia episodes lasting up to 20 seconds.  Echocardiogram 07/30/2024 noted LVEF 60 to 65%, normal RV size and function, no significant wall motion abnormality and no significant valve abnormalities.  Agitated saline study was negative for atrial level shunt.  Past Medical History:  Diagnosis Date   Anemia 09/20/2014   Asthma 05/06/2014   Bilateral leg edema 09/15/2020   Bradycardia 09/28/2023   CAD (coronary artery disease), moderate nonobstructive disease CT coronaries August 2024, mildly abnormal CT FFR very distal LAD and OM territory, medically managed 10/20/2023   Chest pain of uncertain etiology 09/15/2020   COPD (chronic obstructive pulmonary disease) (HCC)    Critical lower limb ischemia (HCC) 01/08/2024   Daytime somnolence 09/15/2020   Dyslipidemia    GERD (gastroesophageal reflux disease)    GERD (gastroesophageal reflux disease)    High blood pressure    Hypercholesterolemia 09/20/2014   Hypokalemia    Medication management 09/15/2020   Murmur 09/15/2020   Muscle cramps    Osteoarthritis 09/20/2014   Other chest pain 09/15/2020   PAD (peripheral artery disease) 01/08/2024   Pain in both lower extremities  09/15/2020   Palpitations 08/06/2023   Peripheral vascular disease, unspecified 10/20/2023   Postural lightheadedness 08/18/2023   Precordial pain 09/15/2020   Presence of permanent cardiac pacemaker    Psoriasis    Sick sinus syndrome (HCC), symptomatic with bradycardia, s/p permanent pacemaker implant 09/29/2023 10/20/2023   Snoring 09/15/2020   SOB (shortness of breath) 09/15/2020   Symptomatic bradycardia 09/27/2023   Syncope and collapse 08/06/2023   Unstable angina (HCC) 07/29/2024   Vitamin D deficiency 09/20/2014    Past Surgical History:  Procedure Laterality Date   ABDOMINAL AORTOGRAM W/LOWER EXTREMITY Right 09/05/2023   Procedure: ABDOMINAL AORTOGRAM W/LOWER EXTREMITY;  Surgeon: Magda [name], MD;  Location: [facility] INVASIVE CV LAB;  Service: Cardiovascular;  Laterality: Right;   ABDOMINAL AORTOGRAM W/LOWER EXTREMITY N/A 12/31/2023   Procedure: ABDOMINAL AORTOGRAM W/LOWER EXTREMITY;  Surgeon: Lanis Fonda BRAVO, MD;  Location: [facility] INVASIVE CV LAB;  Service: Cardiovascular;  Laterality: N/A;   ABDOMINAL HYSTERECTOMY     APPENDECTOMY     arthroscopic shoulder surgery     BACK SURGERY     CATARACT EXTRACTION, BILATERAL     CHOLECYSTECTOMY     CORONARY STENT INTERVENTION N/A 07/30/2024   Procedure: CORONARY STENT INTERVENTION;  Surgeon: Jordan, Peter M, MD;  Location: [facility] INVASIVE CV LAB;  Service: Cardiovascular;  Laterality: N/A;   ENDARTERECTOMY FEMORAL Right 01/08/2024   Procedure: ENDARTERECTOMY,  RIGHT ILIOFEMORAL;  Surgeon: Magda [name], MD;  Location:  [facility] OR;  Service: Vascular;  Laterality: Right;   FEMORAL-POPLITEAL BYPASS GRAFT Right 01/08/2024   Procedure: RIGHT FEMORAL TO BELOW KNEE POPLITEAL ARTERY BYPASS GRAFT 6mm RINGED PROPATEN GRAFT;  Surgeon: Magda [name], MD;  Location: [facility] OR;  Service: Vascular;  Laterality: Right;   LEFT HEART CATH AND CORONARY ANGIOGRAPHY N/A 07/30/2024   Procedure: LEFT HEART CATH AND CORONARY ANGIOGRAPHY;  Surgeon: Jordan, Peter M, MD;   Location: [facility] INVASIVE CV LAB;  Service: Cardiovascular;  Laterality: N/A;   LOWER EXTREMITY ANGIOGRAPHY Bilateral 12/31/2023   Procedure: Lower Extremity Angiography;  Surgeon: Lanis Fonda BRAVO, MD;  Location: [facility] INVASIVE CV LAB;  Service: Cardiovascular;  Laterality: Bilateral;   MASTECTOMY     MASTOIDECTOMY     PACEMAKER IMPLANT N/A 09/29/2023   Procedure: PACEMAKER IMPLANT - DUAL CHAMBER;  Surgeon: Waddell Danelle ORN, MD;  Location: [facility] INVASIVE CV LAB;  Service: Cardiovascular;  Laterality: N/A;   PARTIAL COLECTOMY     PERIPHERAL VASCULAR INTERVENTION Right 09/05/2023   Procedure: PERIPHERAL VASCULAR INTERVENTION;  Surgeon: Magda [name], MD;  Location: [facility] INVASIVE CV LAB;  Service: Cardiovascular;  Laterality: Right;  SFA    Current Medications: Current Meds  Medication Sig   acetaminophen  (TYLENOL ) 500 MG tablet Take 500-1,000 mg by mouth every 6 (six) hours as needed for mild pain (pain score 1-3), moderate pain (pain score 4-6) or headache.   albuterol  (PROVENTIL  HFA;VENTOLIN  HFA) 108 (90 Base) MCG/ACT inhaler Inhale 1-2 puffs into the lungs as needed for wheezing or shortness of breath.   ALPRAZolam  (XANAX ) 0.25 MG tablet Take 0.25 mg by mouth at bedtime.   amLODipine (NORVASC) 10 MG tablet Take 1 tablet (10 mg total) by mouth daily.   aspirin  EC 81 MG tablet Take 1 tablet (81 mg total) by mouth daily. Swallow whole.   budesonide-glycopyrrolate-formoterol (BREZTRI ) 160-9-4.8 MCG/ACT AERO inhaler Inhale 2 puffs into the lungs 2 (two) times daily.   furosemide  (LASIX ) 40 MG tablet Take 40 mg by mouth 2 (two) times daily as needed for fluid.   gabapentin  (NEURONTIN ) 300 MG capsule Take 300 mg by mouth at bedtime.   metoprolol  tartrate (LOPRESSOR ) 50 MG tablet Take 1 tablet (50 mg total) by mouth 2 (two) times daily.   Multiple Vitamin (MULTIVITAMIN WITH MINERALS) TABS tablet Take 1 tablet by mouth every evening. Multivitamin for Women   nicotine  (NICODERM CQ  - DOSED IN MG/24 HOURS) 21  mg/24hr patch Place 21 mg onto the skin daily.   nitroGLYCERIN  (NITROSTAT ) 0.4 MG SL tablet Place 1 tablet (0.4 mg total) under the tongue every 5 (five) minutes as needed for chest pain.   PARoxetine  (PAXIL ) 40 MG tablet Take 40 mg by mouth every morning.   rosuvastatin  (CRESTOR ) 40 MG tablet Take 1 tablet (40 mg total) by mouth daily.   ticagrelor (BRILINTA) 90 MG TABS tablet Take 1 tablet (90 mg total) by mouth 2 (two) times daily.   traMADol (ULTRAM) 50 MG tablet Take 50 mg by mouth 3 (three) times daily as needed for moderate pain (pain score 4-6).     Allergies:   Aspirin , Codeine, and Levaquin [levofloxacin in d5w]   Social History   Socioeconomic History   Marital status: Widowed    Spouse name: Not on file   Number of children: 2   Years of education: Not on file   Highest education level: Not on file  Occupational History   Not on file  Tobacco Use   Smoking status: Every Day  Current packs/day: 0.50    Types: Cigarettes   Smokeless tobacco: Never   Tobacco comments:    1-3 cigs daily  Vaping Use   Vaping status: Never Used  Substance and Sexual Activity   Alcohol use: Never   Drug use: Never   Sexual activity: Not on file  Other Topics Concern   Not on file  Social History Narrative   Not on file   Social Drivers of Health   Financial Resource Strain: Not on file  Food Insecurity: No Food Insecurity (07/30/2024)   Hunger Vital Sign    Worried About Running Out of Food in the Last Year: Never true    Ran Out of Food in the Last Year: Never true  Transportation Needs: No Transportation Needs (07/30/2024)   PRAPARE - Administrator, Civil Service (Medical): No    Lack of Transportation (Non-Medical): No  Physical Activity: Not on file  Stress: Not on file  Social Connections: Patient Declined (07/30/2024)   Social Connection and Isolation Panel    Frequency of Communication with Friends and Family: Patient declined    Frequency of Social  Gatherings with Friends and Family: Patient declined    Attends Religious Services: Patient declined    Database Administrator or Organizations: Patient declined    Attends Banker Meetings: Patient declined    Marital Status: Patient declined     Family History: The patient's family history includes Heart disease in her father; Hypertension in her father; Pancreatic cancer in her brother, mother, and sister. ROS:   Please see the history of present illness.    All 14 point review of systems negative except as described per history of present illness.  EKGs/Labs/Other Studies Reviewed:    The following studies were reviewed today:   EKG:       Recent Labs: 09/27/2023: TSH 1.584 09/29/2023: Magnesium  2.0 01/08/2024: ALT 15 07/31/2024: BUN 13; Creatinine, Ser 1.23; Hemoglobin 10.5; Platelets 133; Potassium 3.7; Sodium 141  Recent Lipid Panel    Component Value Date/Time   CHOL 103 01/09/2024 0401   TRIG 56 01/09/2024 0401   HDL 56 01/09/2024 0401   CHOLHDL 1.8 01/09/2024 0401   VLDL 11 01/09/2024 0401   LDLCALC 36 01/09/2024 0401    Physical Exam:    VS:  BP 130/72   Pulse 74   Resp (!) 97   Ht 5' 7 (1.702 m)   Wt 148 lb 6.4 oz (67.3 kg)   BMI 23.24 kg/m     Wt Readings from Last 3 Encounters:  08/09/24 148 lb 6.4 oz (67.3 kg)  07/30/24 145 lb 8.1 oz (66 kg)  07/29/24 144 lb 6.4 oz (65.5 kg)     GENERAL:  Well nourished, well developed in no acute distress NECK: No JVD; No carotid bruits CARDIAC: RRR, S1 and S2 present, no murmurs, no rubs, no gallops CHEST:  Clear to auscultation without rales, wheezing or rhonchi  Extremities: No pitting pedal edema.  Right radial artery Access site mildly tender without any significant swelling.  Distal to cath access site radial pulses 2+, bilaterally similar. NEUROLOGIC:  Alert and oriented x 3  Medication Adjustments/Labs and Tests Ordered: Current medicines are reviewed at length with the patient today.   Concerns regarding medicines are outlined above.  No orders of the defined types were placed in this encounter.  Meds ordered this encounter  Medications   aspirin  EC 81 MG tablet    Sig: Take 1 tablet (  81 mg total) by mouth daily. Swallow whole.    Signed, Alean jess Kobus, MD, MPH, [facility]. 08/09/2024 12:39 PM    [facility]

## 2024-08-25 ENCOUNTER — Other Ambulatory Visit: Payer: Self-pay

## 2024-08-25 ENCOUNTER — Ambulatory Visit: Attending: Vascular Surgery | Admitting: Vascular Surgery

## 2024-08-25 ENCOUNTER — Encounter: Payer: Self-pay | Admitting: Vascular Surgery

## 2024-08-25 VITALS — BP 184/92 | HR 98 | Temp 98.0°F | Resp 20 | Ht 67.0 in | Wt 147.6 lb

## 2024-08-25 DIAGNOSIS — I70213 Atherosclerosis of native arteries of extremities with intermittent claudication, bilateral legs: Secondary | ICD-10-CM

## 2024-08-25 DIAGNOSIS — I70222 Atherosclerosis of native arteries of extremities with rest pain, left leg: Secondary | ICD-10-CM | POA: Insufficient documentation

## 2024-08-25 NOTE — Progress Notes (Signed)
 CC:  F/u for surgery  HPI:  This is a 78 y.o. female who is s/p Right t iliofemoral endarterectomy and bovine pericardial patch angioplasty and right common femoral to below-knee popliteal artery bypass with 6 mm externally supported PTFE and subfascial plane 01/08/2024 for rest pain and ulceration  Pt returns today for follow up.  Pt states she is doing very well since surgery.  She states that her leg feels much better.  Her wound is healing.  She does have some swelling that improves with elevation.  She states it gets worse with prolonged standing.  She has some numbness in the right lower leg around the incision and outer leg.   She states that she is down to one cigarette every week or week and a half.  She has a goal of no more smoking next week.  She states that she has used goals to quit smoking.   Pt is on statin, plavix , and coumadin  (for afib).   06/09/24: Patient returns to clinic for surveillance of peripheral arterial disease status post right lower extremity bypass.  She is doing very well overall.  She does report some cramping at night which is bothersome to her.  She does not have the typical cramping pain with walking seen and claudication.  She has no ischemic rest pain.  She has no ulcers about her feet.  She does notice more discomfort in her left leg, when compared to her right.  We reviewed her noninvasive testing in detail.  07/20/24: Patient returns to clinic with new left leg disabling claudication and rest pain.  She is not able to tolerate her level of symptoms.  I recommended she undergo an angiogram with possible percutaneous intervention.  She is not keen on stenting given its early failure in her right leg.  08/25/24: Patient returns to clinic for follow-up.  We had scheduled her to undergo left lower extremity angiography for rest pain, unfortunately she developed unstable angina and required percutaneous coronary intervention.  Stent was applied and dual  antiplatelet therapy with Brilinta was initiated.  The patient suffered a fairly serious right leg injury with a coat hanger that got stuck in her skin several days ago.  She is undergoing wound care for this.  Thankfully this is the leg that has been revascularized.  Her left leg is still quite bothersome to her.  She would like to start cardiac rehab, but cannot because of her left leg pain.  I counseled her that the main issue in hand is the strict requirement to keep her on Brilinta to maintain patency of her coronary intervention.  I counseled her that though it may not be as durable as open surgery, a stenting solution would be favored in the left leg because of the requirement for continued Brilinta.  She is understanding and willing to proceed.  Allergies  Allergen Reactions   Aspirin  Shortness Of Breath, Itching, Swelling and Other (See Comments)    Tongue swelling   Codeine Shortness Of Breath, Swelling and Rash   Levaquin [Levofloxacin In D5w] Other (See Comments)    Severe yeast reaction.    Current Outpatient Medications  Medication Sig Dispense Refill   acetaminophen  (TYLENOL ) 500 MG tablet Take 500-1,000 mg by mouth every 6 (six) hours as needed for mild pain (pain score 1-3), moderate pain (pain score 4-6) or headache.     albuterol  (PROVENTIL  HFA;VENTOLIN  HFA) 108 (90 Base) MCG/ACT inhaler Inhale 1-2 puffs into the lungs as  needed for wheezing or shortness of breath.     ALPRAZolam  (XANAX ) 0.25 MG tablet Take 0.25 mg by mouth at bedtime.     amLODipine (NORVASC) 10 MG tablet Take 1 tablet (10 mg total) by mouth daily. 90 tablet 1   aspirin  EC 81 MG tablet Take 1 tablet (81 mg total) by mouth daily. Swallow whole.     budesonide-glycopyrrolate-formoterol (BREZTRI ) 160-9-4.8 MCG/ACT AERO inhaler Inhale 2 puffs into the lungs 2 (two) times daily.     furosemide  (LASIX ) 40 MG tablet Take 40 mg by mouth 2 (two) times daily as needed for fluid.     gabapentin  (NEURONTIN ) 300 MG capsule  Take 300 mg by mouth at bedtime.     metoprolol  tartrate (LOPRESSOR ) 50 MG tablet Take 1 tablet (50 mg total) by mouth 2 (two) times daily. 60 tablet 1   Multiple Vitamin (MULTIVITAMIN WITH MINERALS) TABS tablet Take 1 tablet by mouth every evening. Multivitamin for Women     nicotine  (NICODERM CQ  - DOSED IN MG/24 HOURS) 21 mg/24hr patch Place 21 mg onto the skin daily.     nitroGLYCERIN  (NITROSTAT ) 0.4 MG SL tablet Place 1 tablet (0.4 mg total) under the tongue every 5 (five) minutes as needed for chest pain. 25 tablet 5   PARoxetine  (PAXIL ) 40 MG tablet Take 40 mg by mouth every morning.     rosuvastatin  (CRESTOR ) 40 MG tablet Take 1 tablet (40 mg total) by mouth daily. 90 tablet 3   ticagrelor (BRILINTA) 90 MG TABS tablet Take 1 tablet (90 mg total) by mouth 2 (two) times daily. 180 tablet 3   traMADol (ULTRAM) 50 MG tablet Take 50 mg by mouth 3 (three) times daily as needed for moderate pain (pain score 4-6).     No current facility-administered medications for this visit.     ROS:  See HPI  Physical Exam:  Today's Vitals   08/25/24 0831  BP: (!) 184/92  Pulse: 98  Resp: 20  Temp: 98 F (36.7 C)  TempSrc: Temporal  SpO2: 98%  Weight: 147 lb 9.6 oz (67 kg)  Height: 5' 7 (1.702 m)  PainSc: 4      Body mass index is 23.12 kg/m.  No acute distress Regular rate and rhythm Unlabored breathing Well-healed incisions from prior bypass in the right leg Right lateral calf wound which is new.  Deep laceration which appears to be healing Dependent rubor in bilateral lower extremities Feet are warm and well-perfused   LOWER EXTREMITY DOPPLER STUDY   Patient Name:  Tina George  Date of Exam:   07/20/2024  Medical Rec #: [id number]       Accession #:    [id number]  Date of Birth: [birth year]       Patient Gender: F  Patient Age:   28 years  Exam Location:  [address]  Procedure:      VAS US  ABI WITH/WO TBI  Referring Phys: DEBBY ROBERTSON     ---------------------------------------------------------------------------  -----    Indications: Peripheral artery disease. Patient called reporting left toes  and              foot are turning purple and black, rest pain in left foot and               increased cramping in left calf.               Patient also reporting increased swelling.   High Risk Factors: Hypertension, hyperlipidemia, current smoker, coronary  artery                    disease.   Other Factors: S/p Right t iliofemoral endarterectomy and bovine  pericardial                patch angioplasty and right common femoral to below-knee                 popliteal artery bypass with 6 mm externally supported PTFE  and                subfascial plane 01/08/2024 by Dr. Magda for rest pain and                 ulceration.   Performing Technologist: [employer] BS, RVT, RDCS     Examination Guidelines: A complete evaluation includes at minimum, Doppler  waveform signals and systolic blood pressure reading at the level of  bilateral  brachial, anterior tibial, and posterior tibial arteries, when vessel  segments  are accessible. Bilateral testing is considered an integral part of a  complete  examination. Photoelectric Plethysmograph (PPG) waveforms and toe systolic  pressure readings are included as required and additional duplex testing  as  needed. Limited examinations for reoccurring indications may be performed  as  noted.     ABI Findings:  +---------+------------------+-----+--------+--------+  Right   Rt Pressure (mmHg)IndexWaveformComment   +---------+------------------+-----+--------+--------+  Brachial 164                                      +---------+------------------+-----+--------+--------+  ATA     178               0.98                   +---------+------------------+-----+--------+--------+  PTA     167               0.92                    +---------+------------------+-----+--------+--------+  Great Toe143               0.79                   +---------+------------------+-----+--------+--------+   +---------+------------------+-----+--------+-------+  Left    Lt Pressure (mmHg)IndexWaveformComment  +---------+------------------+-----+--------+-------+  Brachial 182                                     +---------+------------------+-----+--------+-------+  ATA     102               0.56                  +---------+------------------+-----+--------+-------+  PTA     107               0.59                  +---------+------------------+-----+--------+-------+  Great Toe75                0.41                  +---------+------------------+-----+--------+-------+   +-------+-----------+-----------+------------+------------+  ABI/TBIToday's ABIToday's TBIPrevious ABIPrevious TBI  +-------+-----------+-----------+------------+------------+  Right .98        .79  1.03        .71           +-------+-----------+-----------+------------+------------+  Left  .59        .41        .61         .31           +-------+-----------+-----------+------------+------------+         Bilateral ABIs and TBIs appear essentially unchanged compared to prior  study on 06/09/24.    Summary:  Right: Resting right ankle-brachial index is within normal range. The  right toe-brachial index is normal.    Left: Resting left ankle-brachial index indicates moderate left lower  extremity arterial disease.  Left toe pressure is >60 mmHg which suggests adequate perfusion for  healing.   *See table(s) above for measurements and observations.       Electronically signed by Debby Robertson on 07/20/2024 at 10:08:07 AM.      Assessment/Plan:  This is a 77 y.o. female who is s/p: Right t iliofemoral endarterectomy and bovine pericardial patch angioplasty and right common femoral to  below-knee popliteal artery bypass with 6 mm externally supported PTFE and subfascial plane 01/08/2024 for rest pain and ulceration  Recommend:  Abstinence from all tobacco products. Blood glucose control with goal A1c < 7%. Blood pressure control with goal blood pressure < 130/80 mmHg. Lipid reduction therapy with goal LDL-C < 55 mg/dL. Clopidogrel  75mg  by mouth daily. Coumadin  Atorvastatin 40-80mg  PO QD (or other high intensity statin therapy).  Plan aortogram, bilateral lower extremity angiogram, focus on left lower extremity with possible intervention.    [name]. Robertson, MD [facility] Phone Number: [phone] 08/25/2024 8:38 AM

## 2024-09-14 ENCOUNTER — Ambulatory Visit

## 2024-09-14 NOTE — Progress Notes (Deleted)
 Cardiology Consultation:    Date:  09/14/2024   ID:  Tina George, DOB [birth year], MRN [id number]  PCP:  Jama Chow, MD  Cardiologist:  Alean SAUNDERS Charlet Harr, MD   Referring MD: Jama Chow, MD   No chief complaint on file.    ASSESSMENT AND PLAN:   Tina George 78 year old woman with Problem List Items Addressed This Visit   None     History of Present Illness:    Tina George is a 78 y.o. female who is being seen today for follow-up visit. PCP Jama Chow, MD.  Last visit with me in the office was 07/29/2024.  Has history of moderate  CAD on cardiac CT August 2024 with abnormal CT FFR of very distal LAD and OM territories being medically managed.  At office visit October 2025 was acutely symptomatic with chest pain suggestive of unstable angina and sent to the ER and cardiac cath 07/30/2024 showed single-vessel obstructive CAD involving large LCx/OM1 underwent successful PCI with DES and started on Brilinta  monotherapy [due to aspirin  allergy].  Repeat echocardiogram 07/30/2024 shows LVEF 60 to 65% no wall motion abnormality, normal diastolic parameters, no significant valve abnormalities.  Also has sick sinus syndrome with symptomatic bradycardia and poor heart rate response s/p permanent pacemaker implant 09/29/2023,  hypertension, hyperlipidemia, CKD stage III, smokes tobacco [quit March 2025], peripheral neuropathy, benign essential tremor, asthma/COPD, chronic venous insufficiency. peripheral arterial disease [s/p right iliofemoral endarterectomy and bovine pericardial patch angioplasty and right CFA to below-knee popliteal artery bypass 01/08/2024] continues to follow up with vascular surgeon Dr. Magda being planned for aortogram and bilateral lower extremity angiogram with possible left lower extremity intervention tentatively plan for 10/17/205, was on both warfarin and clopidogrel  from vascular standpoint but was switched to Brilinta  after the cardiac cath 07/30/2024 in the  setting of unstable angina.  Her bilateral lower extremity angiogram and possible angioplasty is now tentatively scheduled for 09/17/2024   Now here for routine follow-up visit but mentions symptoms of chest pain last night and this morning. Highly suspicious for unstable angina.   Past Medical History:  Diagnosis Date   Anemia 09/20/2014   Asthma 05/06/2014   Bilateral leg edema 09/15/2020   Bradycardia 09/28/2023   CAD (coronary artery disease), moderate nonobstructive disease CT coronaries August 2024, mildly abnormal CT FFR very distal LAD and OM territory, medically managed 10/20/2023   Chest pain of uncertain etiology 09/15/2020   COPD (chronic obstructive pulmonary disease) (HCC)    Critical lower limb ischemia (HCC) 01/08/2024   Daytime somnolence 09/15/2020   Dyslipidemia    GERD (gastroesophageal reflux disease)    GERD (gastroesophageal reflux disease)    High blood pressure    Hypercholesterolemia 09/20/2014   Hypokalemia    Medication management 09/15/2020   Murmur 09/15/2020   Muscle cramps    Osteoarthritis 09/20/2014   Other chest pain 09/15/2020   PAD (peripheral artery disease) 01/08/2024   Pain in both lower extremities 09/15/2020   Palpitations 08/06/2023   Peripheral vascular disease, unspecified 10/20/2023   Postural lightheadedness 08/18/2023   Precordial pain 09/15/2020   Presence of permanent cardiac pacemaker    Psoriasis    Sick sinus syndrome (HCC), symptomatic with bradycardia, s/p permanent pacemaker implant 09/29/2023 10/20/2023   Snoring 09/15/2020   SOB (shortness of breath) 09/15/2020   Symptomatic bradycardia 09/27/2023   Syncope and collapse 08/06/2023   Unstable angina (HCC) 07/29/2024   Vitamin D deficiency 09/20/2014    Past Surgical History:  Procedure Laterality Date  ABDOMINAL AORTOGRAM W/LOWER EXTREMITY Right 09/05/2023   Procedure: ABDOMINAL AORTOGRAM W/LOWER EXTREMITY;  Surgeon: Magda [name], MD;  Location: [facility] INVASIVE CV  LAB;  Service: Cardiovascular;  Laterality: Right;   ABDOMINAL AORTOGRAM W/LOWER EXTREMITY N/A 12/31/2023   Procedure: ABDOMINAL AORTOGRAM W/LOWER EXTREMITY;  Surgeon: Lanis Fonda BRAVO, MD;  Location[facility] INVASIVE CV LAB;  Service: Cardiovascular;  Laterality: N/A;   ABDOMINAL HYSTERECTOMY     APPENDECTOMY     arthroscopic shoulder surgery     BACK SURGERY     CATARACT EXTRACTION, BILATERAL     CHOLECYSTECTOMY     CORONARY STENT INTERVENTION N/A 07/30/2024   Procedure: CORONARY STENT INTERVENTION;  Surgeon: Jordan, Peter M, MD;  Location: [facility] INVASIVE CV LAB;  Service: Cardiovascular;  Laterality: N/A;   ENDARTERECTOMY FEMORAL Right 01/08/2024   Procedure: ENDARTERECTOMY,  RIGHT ILIOFEMORAL;  Surgeon: Magda [name], MD;  Location: [facility] OR;  Service: Vascular;  Laterality: Right;   FEMORAL-POPLITEAL BYPASS GRAFT Right 01/08/2024   Procedure: RIGHT FEMORAL TO BELOW KNEE POPLITEAL ARTERY BYPASS GRAFT 6mm RINGED PROPATEN GRAFT;  Surgeon: Magda [name], MD;  Location: [facility] OR;  Service: Vascular;  Laterality: Right;   LEFT HEART CATH AND CORONARY ANGIOGRAPHY N/A 07/30/2024   Procedure: LEFT HEART CATH AND CORONARY ANGIOGRAPHY;  Surgeon: Jordan, Peter M, MD;  Location: [facility] INVASIVE CV LAB;  Service: Cardiovascular;  Laterality: N/A;   LOWER EXTREMITY ANGIOGRAPHY Bilateral 12/31/2023   Procedure: Lower Extremity Angiography;  Surgeon: Lanis Fonda BRAVO, MD;  Location: [facility] INVASIVE CV LAB;  Service: Cardiovascular;  Laterality: Bilateral;   MASTECTOMY     MASTOIDECTOMY     PACEMAKER IMPLANT N/A 09/29/2023   Procedure: PACEMAKER IMPLANT - DUAL CHAMBER;  Surgeon: Waddell Danelle ORN, MD;  Location: [facility] INVASIVE CV LAB;  Service: Cardiovascular;  Laterality: N/A;   PARTIAL COLECTOMY     PERIPHERAL VASCULAR INTERVENTION Right 09/05/2023   Procedure: PERIPHERAL VASCULAR INTERVENTION;  Surgeon: Magda [name], MD;  Location: [facility] INVASIVE CV LAB;  Service: Cardiovascular;  Laterality: Right;  SFA    Current  Medications: No outpatient medications have been marked as taking for the 09/14/24 encounter (Appointment) with Tina George, Alean SAUNDERS, MD.     Allergies:   Aspirin , Codeine, and Levaquin [levofloxacin in d5w]   Social History   Socioeconomic History   Marital status: Widowed    Spouse name: Not on file   Number of children: 2   Years of education: Not on file   Highest education level: Not on file  Occupational History   Not on file  Tobacco Use   Smoking status: Every Day    Current packs/day: 0.50    Types: Cigarettes   Smokeless tobacco: Never   Tobacco comments:    1-3 cigs daily, trying to completely quit  Vaping Use   Vaping status: Never Used  Substance and Sexual Activity   Alcohol use: Never   Drug use: Never   Sexual activity: Not on file  Other Topics Concern   Not on file  Social History Narrative   Not on file   Social Drivers of Health   Financial Resource Strain: Not on file  Food Insecurity: No Food Insecurity (07/30/2024)   Hunger Vital Sign    Worried About Running Out of Food in the Last Year: Never true    Ran Out of Food in the Last Year: Never true  Transportation Needs: No Transportation Needs (07/30/2024)   PRAPARE - Administrator, Civil Service (Medical): No  Lack of Transportation (Non-Medical): No  Physical Activity: Not on file  Stress: Not on file  Social Connections: Patient Declined (07/30/2024)   Social Connection and Isolation Panel    Frequency of Communication with Friends and Family: Patient declined    Frequency of Social Gatherings with Friends and Family: Patient declined    Attends Religious Services: Patient declined    Database Administrator or Organizations: Patient declined    Attends Banker Meetings: Patient declined    Marital Status: Patient declined     Family History: The patient's family history includes Heart disease in her father; Hypertension in her father; Pancreatic cancer in  her brother, mother, and sister. ROS:   Please see the history of present illness.    All 14 point review of systems negative except as described per history of present illness.  EKGs/Labs/Other Studies Reviewed:    The following studies were reviewed today:   EKG:       Recent Labs: 09/27/2023: TSH 1.584 09/29/2023: Magnesium  2.0 01/08/2024: ALT 15 07/31/2024: BUN 13; Creatinine, Ser 1.23; Hemoglobin 10.5; Platelets 133; Potassium 3.7; Sodium 141  Recent Lipid Panel    Component Value Date/Time   CHOL 103 01/09/2024 0401   TRIG 56 01/09/2024 0401   HDL 56 01/09/2024 0401   CHOLHDL 1.8 01/09/2024 0401   VLDL 11 01/09/2024 0401   LDLCALC 36 01/09/2024 0401    Physical Exam:    VS:  There were no vitals taken for this visit.    Wt Readings from Last 3 Encounters:  08/25/24 147 lb 9.6 oz (67 kg)  08/09/24 148 lb 6.4 oz (67.3 kg)  07/30/24 145 lb 8.1 oz (66 kg)     GENERAL:  Well nourished, well developed in no acute distress NECK: No JVD; No carotid bruits CARDIAC: RRR, S1 and S2 present, no murmurs, no rubs, no gallops CHEST:  Clear to auscultation without rales, wheezing or rhonchi  Extremities: No pitting pedal edema. Pulses bilaterally symmetric with radial 2+ and dorsalis pedis 2+ NEUROLOGIC:  Alert and oriented x 3  Medication Adjustments/Labs and Tests Ordered: Current medicines are reviewed at length with the patient today.  Concerns regarding medicines are outlined above.  No orders of the defined types were placed in this encounter.  No orders of the defined types were placed in this encounter.   Signed, Alean jess Kobus, MD, MPH, [facility]. 09/14/2024 4:02 PM    [facility]

## 2024-09-28 ENCOUNTER — Ambulatory Visit: Payer: Medicare Other

## 2024-09-28 ENCOUNTER — Telehealth: Payer: Self-pay

## 2024-09-28 DIAGNOSIS — I495 Sick sinus syndrome: Secondary | ICD-10-CM

## 2024-09-28 NOTE — Telephone Encounter (Signed)
 Pt c/o Shortness Of Breath: STAT if SOB developed within the last 24 hours or pt is noticeably SOB on the phone  1. Are you currently SOB (can you hear that pt is SOB on the phone)? no  2. How long have you been experiencing SOB? 3 weeks but getting worse  3. Are you SOB when sitting or when up moving around? Moving around  4. Are you currently experiencing any other symptoms? Fatigued, legs feel like they are going to give out

## 2024-09-28 NOTE — Telephone Encounter (Signed)
 Spoke with the pt who states that for 2 weeks she has been getting more short of breath and fatigued with exertion. Pt states that she normally weighs 140-142 and yesterday at Dr.Lee's office she weighed 151. Pt states that she has increased swelling in her legs. Pt is talking in full sentences on the phone. Last dose of Lasix  was Sunday 09/26/24. Pt states that her BP was good yesterday. Pt states that she feels like she did prior to her pacemaker. Please advise.

## 2024-09-29 ENCOUNTER — Ambulatory Visit

## 2024-09-29 VITALS — BP 136/56 | HR 63 | Ht 67.0 in | Wt 148.4 lb

## 2024-09-29 DIAGNOSIS — R0602 Shortness of breath: Secondary | ICD-10-CM | POA: Insufficient documentation

## 2024-09-29 DIAGNOSIS — I509 Heart failure, unspecified: Secondary | ICD-10-CM

## 2024-09-29 DIAGNOSIS — E7800 Pure hypercholesterolemia, unspecified: Secondary | ICD-10-CM | POA: Insufficient documentation

## 2024-09-29 DIAGNOSIS — I2510 Atherosclerotic heart disease of native coronary artery without angina pectoris: Secondary | ICD-10-CM | POA: Insufficient documentation

## 2024-09-29 DIAGNOSIS — I5032 Chronic diastolic (congestive) heart failure: Secondary | ICD-10-CM | POA: Diagnosis present

## 2024-09-29 HISTORY — DX: Heart failure, unspecified: I50.9

## 2024-09-29 LAB — CUP PACEART REMOTE DEVICE CHECK
Battery Remaining Longevity: 138 mo
Battery Remaining Percentage: 100 %
Brady Statistic RA Percent Paced: 30 %
Brady Statistic RV Percent Paced: 0 %
Date Time Interrogation Session: 20251216030200
Implantable Lead Connection Status: 753985
Implantable Lead Connection Status: 753985
Implantable Lead Implant Date: 20241216
Implantable Lead Implant Date: 20241216
Implantable Lead Location: 753859
Implantable Lead Location: 753860
Implantable Lead Model: 7840
Implantable Lead Model: 7841
Implantable Lead Serial Number: 1122086
Implantable Lead Serial Number: 1532872
Implantable Pulse Generator Implant Date: 20241216
Lead Channel Impedance Value: 561 Ohm
Lead Channel Impedance Value: 731 Ohm
Lead Channel Pacing Threshold Amplitude: 0.8 V
Lead Channel Pacing Threshold Amplitude: 0.8 V
Lead Channel Pacing Threshold Pulse Width: 0.4 ms
Lead Channel Pacing Threshold Pulse Width: 0.4 ms
Lead Channel Setting Pacing Amplitude: 3.5 V
Lead Channel Setting Pacing Amplitude: 3.5 V
Lead Channel Setting Pacing Pulse Width: 0.4 ms
Lead Channel Setting Sensing Sensitivity: 2.5 mV
Pulse Gen Serial Number: 132101
Zone Setting Status: 755011

## 2024-09-29 MED ORDER — FUROSEMIDE 40 MG PO TABS: ORAL_TABLET | ORAL | 3 refills | Status: AC

## 2024-09-29 NOTE — Addendum Note (Signed)
 Addended by: ONEITA BERLINER on: 09/29/2024 03:16 PM   Modules accepted: Orders

## 2024-09-29 NOTE — Patient Instructions (Signed)
 Medication Instructions:  Your physician has recommended you make the following change in your medication:   Take lasix  (furosemide ) 40 mg twice daily for the next 5 days. After completing the 5 days of high dose decrease to 40 mg in the morning and 20 mg in the evening.  *If you need a refill on your cardiac medications before your next appointment, please call your pharmacy*   Lab Work: Your physician recommends that you return for lab work in: 12/26 or 12/29 for BMP, CBC, magnesium  and ProBNP. You can come Monday through Friday 8:30 am to 12:00 pm and 1:15 to 4:30. You do not need to make an appointment as the order has already been placed.    If you have labs (blood work) drawn today and your tests are completely normal, you will receive your results only by: MyChart Message (if you have MyChart) OR A paper copy in the mail If you have any lab test that is abnormal or we need to change your treatment, we will call you to review the results.   Testing/Procedures: None ordered   Follow-Up: At [facility], you and your health needs are our priority.  As part of our continuing mission to provide you with exceptional heart care, we have created designated Provider Care Teams.  These Care Teams include your primary Cardiologist (physician) and Advanced Practice Providers (APPs -  Physician Assistants and Nurse Practitioners) who all work together to provide you with the care you need, when you need it.  We recommend signing up for the patient portal called MyChart.  Sign up information is provided on this After Visit Summary.  MyChart is used to connect with patients for Virtual Visits (Telemedicine).  Patients are able to view lab/test results, encounter notes, upcoming appointments, etc.  Non-urgent messages can be sent to your provider as well.   To learn more about what you can do with MyChart, go to [email or web address].    Your next appointment:   3 week(s)  The  format for your next appointment:   In Person  Provider:   Jennifer Crape, MD    Other Instructions none  Important Information About Sugar

## 2024-09-29 NOTE — Telephone Encounter (Signed)
 Appointment made for today to see Dr. Liborio.

## 2024-09-29 NOTE — Assessment & Plan Note (Signed)
 This appears related to fluid buildup and highly suggestive of diastolic CHF. Will increase diuresis. Will reassess labs and echocardiogram. Close follow-up tentatively in 2 to 3 weeks.

## 2024-09-29 NOTE — Assessment & Plan Note (Signed)
 Symptoms appear consistent with hypervolemia and diastolic heart failure. Echocardiogram 07/30/2024 with LVEF normal, no significant wall motion motion abnormality or valve abnormalities.  Appears hypervolemic. NYHA class III.  Reduce salt to less than 2 g/day Increase furosemide  to 40 mg twice daily for the next 5 days after which she will reduce to 40 mg in the morning and 20 mg in the afternoon.  Repeat blood work CBC, BMP, magnesium  and BNP in 10 to 14 days and follow-up in the office tentatively in 3 weeks with me.

## 2024-09-29 NOTE — Assessment & Plan Note (Signed)
Continue rosuvastatin 40 mg once daily

## 2024-09-29 NOTE — Progress Notes (Signed)
 Remote PPM Transmission

## 2024-09-29 NOTE — Progress Notes (Signed)
 Cardiology Consultation:    Date:  09/29/2024   ID:  Tina George, DOB 1946-05-30, MRN [id number]  PCP:  Jama Chow, MD  Cardiologist:  Alean SAUNDERS Paizlee Kinder, MD   Referring MD: Jama Chow, MD   No chief complaint on file.    ASSESSMENT AND PLAN:   [name] 78 year old woman with history of  CAD with recent unstable angina s/p PCI of LCx/OM1 and LVEDP elevated mildly 18 mmHg 07/30/2024, normal biventricular function on echocardiogram without significant wall motion abnormality or valve abnormalities.    Also has history of sick sinus syndrome with symptomatic bradycardia and poor heart rate response s/p permanent pacemaker implant 09/29/2023,  hypertension, hyperlipidemia, CKD stage III, smokes tobacco [quit March 2025], peripheral neuropathy, benign essential tremor, asthma/COPD, chronic venous insufficiency.  He has extensive peripheral arterial disease follows up with vascular surgeon Dr. Magda and was being planned for a aortogram with bilateral lower extremity angiogram and possible intervention on the left lower extremity, which had to be rescheduled in the setting of her unstable angina requiring PCI.  Currently being evaluated for potential intervention January 9.  Now here for symptoms of shortness of breath and bilateral lower extremity edema.   Problem List Items Addressed This Visit       Cardiovascular and Mediastinum   CAD, s/p PCI of LCX 07/30/2024   Continue with dual antiplatelet therapy on aspirin  81 mg daily and Brilinta  90 mg twice daily. Continue rosuvastatin  40 mg once daily. Continue metoprolol  tartrate 50 mg twice a day.       Relevant Medications   furosemide  (LASIX ) 40 MG tablet   CHF (congestive heart failure) (HCC)   Symptoms appear consistent with hypervolemia and diastolic heart failure. Echocardiogram 07/30/2024 with LVEF normal, no significant wall motion motion abnormality or valve abnormalities.  Appears hypervolemic. NYHA class  III.  Reduce salt to less than 2 g/day Increase furosemide  to 40 mg twice daily for the next 5 days after which she will reduce to 40 mg in the morning and 20 mg in the afternoon.  Repeat blood work CBC, BMP, magnesium  and BNP in 10 to 14 days and follow-up in the office tentatively in 3 weeks with me.       Relevant Medications   furosemide  (LASIX ) 40 MG tablet   Other Relevant Orders   CBC   Basic metabolic panel with GFR   Magnesium    Pro b natriuretic peptide (BNP)     Other   SOB (shortness of breath) - Primary   This appears related to fluid buildup and highly suggestive of diastolic CHF. Will increase diuresis. Will reassess labs and echocardiogram. Close follow-up tentatively in 2 to 3 weeks.      Relevant Medications   furosemide  (LASIX ) 40 MG tablet   Other Relevant Orders   EKG 12-Lead (Completed)   CBC   Basic metabolic panel with GFR   Magnesium    Pro b natriuretic peptide (BNP)   Hypercholesterolemia   Continue rosuvastatin  40 mg once daily.      Relevant Medications   furosemide  (LASIX ) 40 MG tablet      History of Present Illness:    Tina George is a 78 y.o. female who is being seen today for follow-up visit. Last visit with me in the office was 08/09/2024. PCP is Jama Chow, MD. Here for an earlier follow-up visit given symptoms of weight gain and shortness of breath.  He was visit by herself  history of CAD with recent  unstable angina s/p PCI of LCx/OM1 and LVEDP elevated mildly 18 mmHg 07/30/2024, normal biventricular function on echocardiogram without significant wall motion abnormality or valve abnormalities.    Also has history of sick sinus syndrome with symptomatic bradycardia and poor heart rate response s/p permanent pacemaker implant 09/29/2023,  hypertension, hyperlipidemia, CKD stage III, smokes tobacco [quit March 2025], peripheral neuropathy, benign essential tremor, asthma/COPD, chronic venous insufficiency.  He has extensive  peripheral arterial disease follows up with vascular surgeon Dr. Magda and was being planned for a aortogram with bilateral lower extremity angiogram and possible intervention on the left lower extremity, which had to be rescheduled in the setting of her unstable angina requiring PCI.  EKG in the clinic today shows atrial paced rhythm heart rate 63/min, QRS duration 66 ms, anteroseptal Q wave appearance.  No significant change in comparison to prior EKG from 07/31/2024.  Recent pacemaker check from 09/28/2024 shows normal device function, 3 short runs of NSVT.   Here for an earlier follow-up visit as she has been feeling symptoms of shortness of breath over the last few weeks. Mentions feels like things are back to where they were before her pacemaker was implanted. Denies any chest pain but does get out of breath. Laying on 3 pillows, status orthopnea. Bilateral lower extremity edema has increased.   Past Medical History:  Diagnosis Date   Anemia 09/20/2014   Asthma 05/06/2014   Bilateral leg edema 09/15/2020   Bradycardia 09/28/2023   CAD (coronary artery disease), moderate nonobstructive disease CT coronaries August 2024, mildly abnormal CT FFR very distal LAD and OM territory, medically managed 10/20/2023   Chest pain of uncertain etiology 09/15/2020   COPD (chronic obstructive pulmonary disease) (HCC)    Critical lower limb ischemia (HCC) 01/08/2024   Daytime somnolence 09/15/2020   Dyslipidemia    GERD (gastroesophageal reflux disease)    GERD (gastroesophageal reflux disease)    High blood pressure    Hypercholesterolemia 09/20/2014   Hypokalemia    Medication management 09/15/2020   Murmur 09/15/2020   Muscle cramps    Osteoarthritis 09/20/2014   Other chest pain 09/15/2020   PAD (peripheral artery disease) 01/08/2024   Pain in both lower extremities 09/15/2020   Palpitations 08/06/2023   Peripheral vascular disease, unspecified 10/20/2023   Postural lightheadedness  08/18/2023   Precordial pain 09/15/2020   Presence of permanent cardiac pacemaker    Psoriasis    Sick sinus syndrome (HCC), symptomatic with bradycardia, s/p permanent pacemaker implant 09/29/2023 10/20/2023   Snoring 09/15/2020   SOB (shortness of breath) 09/15/2020   Symptomatic bradycardia 09/27/2023   Syncope and collapse 08/06/2023   Unstable angina (HCC) 07/29/2024   Vitamin D deficiency 09/20/2014    Past Surgical History:  Procedure Laterality Date   ABDOMINAL AORTOGRAM W/LOWER EXTREMITY Right 09/05/2023   Procedure: ABDOMINAL AORTOGRAM W/LOWER EXTREMITY;  Surgeon: Magda [name], MD;  Location: [facility] INVASIVE CV LAB;  Service: Cardiovascular;  Laterality: Right;   ABDOMINAL AORTOGRAM W/LOWER EXTREMITY N/A 12/31/2023   Procedure: ABDOMINAL AORTOGRAM W/LOWER EXTREMITY;  Surgeon: Lanis Fonda BRAVO, MD;  Location: [facility] INVASIVE CV LAB;  Service: Cardiovascular;  Laterality: N/A;   ABDOMINAL HYSTERECTOMY     APPENDECTOMY     arthroscopic shoulder surgery     BACK SURGERY     CATARACT EXTRACTION, BILATERAL     CHOLECYSTECTOMY     CORONARY STENT INTERVENTION N/A 07/30/2024   Procedure: CORONARY STENT INTERVENTION;  Surgeon: Jordan, Peter M, MD;  Location: [facility] INVASIVE CV LAB;  Service:  Cardiovascular;  Laterality: N/A;   ENDARTERECTOMY FEMORAL Right 01/08/2024   Procedure: ENDARTERECTOMY,  RIGHT ILIOFEMORAL;  Surgeon: Magda [name], MD;  Location: [facility] OR;  Service: Vascular;  Laterality: Right;   FEMORAL-POPLITEAL BYPASS GRAFT Right 01/08/2024   Procedure: RIGHT FEMORAL TO BELOW KNEE POPLITEAL ARTERY BYPASS GRAFT 6mm RINGED PROPATEN GRAFT;  Surgeon: Magda [name], MD;  Location: [facility] OR;  Service: Vascular;  Laterality: Right;   LEFT HEART CATH AND CORONARY ANGIOGRAPHY N/A 07/30/2024   Procedure: LEFT HEART CATH AND CORONARY ANGIOGRAPHY;  Surgeon: Jordan, Peter M, MD;  Location: [facility] INVASIVE CV LAB;  Service: Cardiovascular;  Laterality: N/A;   LOWER EXTREMITY ANGIOGRAPHY Bilateral  12/31/2023   Procedure: Lower Extremity Angiography;  Surgeon: Lanis Fonda BRAVO, MD;  Location: [facility] INVASIVE CV LAB;  Service: Cardiovascular;  Laterality: Bilateral;   MASTECTOMY     MASTOIDECTOMY     PACEMAKER IMPLANT N/A 09/29/2023   Procedure: PACEMAKER IMPLANT - DUAL CHAMBER;  Surgeon: Waddell Danelle ORN, MD;  Location: [facility] INVASIVE CV LAB;  Service: Cardiovascular;  Laterality: N/A;   PARTIAL COLECTOMY     PERIPHERAL VASCULAR INTERVENTION Right 09/05/2023   Procedure: PERIPHERAL VASCULAR INTERVENTION;  Surgeon: Magda [name], MD;  Location: [facility] INVASIVE CV LAB;  Service: Cardiovascular;  Laterality: Right;  SFA    Current Medications: Active Medications[1]   Allergies:   Aspirin , Codeine, and Levaquin [levofloxacin in d5w]   Social History   Socioeconomic History   Marital status: Widowed    Spouse name: Not on file   Number of children: 2   Years of education: Not on file   Highest education level: Not on file  Occupational History   Not on file  Tobacco Use   Smoking status: Every Day    Current packs/day: 0.50    Types: Cigarettes   Smokeless tobacco: Never   Tobacco comments:    1-3 cigs daily, trying to completely quit  Vaping Use   Vaping status: Never Used  Substance and Sexual Activity   Alcohol use: Never   Drug use: Never   Sexual activity: Not on file  Other Topics Concern   Not on file  Social History Narrative   Not on file   Social Drivers of Health   Tobacco Use: High Risk (09/29/2024)   Patient History    Smoking Tobacco Use: Every Day    Smokeless Tobacco Use: Never    Passive Exposure: Not on file  Financial Resource Strain: Not on file  Food Insecurity: No Food Insecurity (07/30/2024)   Epic    Worried About Programme Researcher, Broadcasting/film/video in the Last Year: Never true    Ran Out of Food in the Last Year: Never true  Transportation Needs: No Transportation Needs (07/30/2024)   Epic    Lack of Transportation (Medical): No    Lack of Transportation  (Non-Medical): No  Physical Activity: Not on file  Stress: Not on file  Social Connections: Patient Declined (07/30/2024)   Social Connection and Isolation Panel    Frequency of Communication with Friends and Family: Patient declined    Frequency of Social Gatherings with Friends and Family: Patient declined    Attends Religious Services: Patient declined    Active Member of Clubs or Organizations: Patient declined    Attends Banker Meetings: Patient declined    Marital Status: Patient declined  Depression (PHQ2-9): Not on file  Alcohol Screen: Not on file  Housing: Low Risk (07/30/2024)   Epic  Unable to Pay for Housing in the Last Year: No    Number of Times Moved in the Last Year: 1    Homeless in the Last Year: No  Utilities: Not At Risk (07/30/2024)   Epic    Threatened with loss of utilities: No  Health Literacy: Not on file     Family History: The patient's family history includes Heart disease in her father; Hypertension in her father; Pancreatic cancer in her brother, mother, and sister. ROS:   Please see the history of present illness.    All 14 point review of systems negative except as described per history of present illness.  EKGs/Labs/Other Studies Reviewed:    The following studies were reviewed today:   EKG:  EKG Interpretation Date/Time:  Wednesday September 29 2024 14:26:28 EST Ventricular Rate:  63 PR Interval:  162 QRS Duration:  66 QT Interval:  408 QTC Calculation: 417 R Axis:   66  Text Interpretation: Atrial-paced rhythm Septal infarct (cited on or before 30-Jul-2024) When compared with ECG of 31-Jul-2024 04:55, No significant change was found Confirmed by Liborio Hai reddy [id number]) on 09/29/2024 2:45:14 PM    Recent Labs: 01/08/2024: ALT 15 07/31/2024: BUN 13; Creatinine, Ser 1.23; Hemoglobin 10.5; Platelets 133; Potassium 3.7; Sodium 141  Recent Lipid Panel    Component Value Date/Time   CHOL 103 01/09/2024 0401    TRIG 56 01/09/2024 0401   HDL 56 01/09/2024 0401   CHOLHDL 1.8 01/09/2024 0401   VLDL 11 01/09/2024 0401   LDLCALC 36 01/09/2024 0401    Physical Exam:    VS:  BP (!) 136/56   Pulse 63   Ht 5' 7 (1.702 m)   Wt 148 lb 6 oz (67.3 kg)   SpO2 99%   BMI 23.24 kg/m     Wt Readings from Last 3 Encounters:  09/29/24 148 lb 6 oz (67.3 kg)  08/25/24 147 lb 9.6 oz (67 kg)  08/09/24 148 lb 6.4 oz (67.3 kg)     GENERAL:  Well nourished, well developed in no acute distress NECK: No JVD; No carotid bruits CARDIAC: RRR, S1 and S2 present, no murmurs, no rubs, no gallops CHEST:  Clear to auscultation without rales, wheezing or rhonchi  Extremities: 2+ bilateral pitting pedal edema extending up to the knees.  NEUROLOGIC:  Alert and oriented x 3  Medication Adjustments/Labs and Tests Ordered: Current medicines are reviewed at length with the patient today.  Concerns regarding medicines are outlined above.  Orders Placed This Encounter  Procedures   CBC   Basic metabolic panel with GFR   Magnesium    Pro b natriuretic peptide (BNP)   EKG 12-Lead   Meds ordered this encounter  Medications   furosemide  (LASIX ) 40 MG tablet    Sig: Take 40 mg (1 tablet) twice daily for 5 days then decrease to 40 mg (1 tablet) am and 20 mg (0.5 tablet) in the pm.    Dispense:  145 tablet    Refill:  3    Signed, Tuyet Bader reddy Kynadee Dam, MD, MPH, [facility]. 09/29/2024 3:06 PM     [facility]    [1]  Current Meds  Medication Sig   acetaminophen  (TYLENOL ) 500 MG tablet Take 500-1,000 mg by mouth every 6 (six) hours as needed for mild pain (pain score 1-3), moderate pain (pain score 4-6) or headache.   albuterol  (PROVENTIL  HFA;VENTOLIN  HFA) 108 (90 Base) MCG/ACT inhaler Inhale 1-2 puffs into the lungs as needed for wheezing or shortness  of breath.   ALPRAZolam  (XANAX ) 0.25 MG tablet Take 0.25 mg by mouth at bedtime.   amLODipine  (NORVASC ) 10 MG tablet Take 1 tablet (10 mg total) by  mouth daily.   aspirin  EC 81 MG tablet Take 1 tablet (81 mg total) by mouth daily. Swallow whole.   budesonide -glycopyrrolate -formoterol  (BREZTRI ) 160-9-4.8 MCG/ACT AERO inhaler Inhale 2 puffs into the lungs 2 (two) times daily.   buPROPion (WELLBUTRIN XL) 150 MG 24 hr tablet Take 150 mg by mouth every morning.   gabapentin  (NEURONTIN ) 300 MG capsule Take 300 mg by mouth at bedtime.   metoprolol  tartrate (LOPRESSOR ) 50 MG tablet Take 1 tablet (50 mg total) by mouth 2 (two) times daily.   Multiple Vitamin (MULTIVITAMIN WITH MINERALS) TABS tablet Take 1 tablet by mouth every evening. Multivitamin for Women   nicotine  (NICODERM CQ  - DOSED IN MG/24 HOURS) 21 mg/24hr patch Place 21 mg onto the skin daily.   nitroGLYCERIN  (NITROSTAT ) 0.4 MG SL tablet Place 1 tablet (0.4 mg total) under the tongue every 5 (five) minutes as needed for chest pain.   PARoxetine  (PAXIL ) 40 MG tablet Take 40 mg by mouth every morning.   rosuvastatin  (CRESTOR ) 40 MG tablet Take 1 tablet (40 mg total) by mouth daily.   ticagrelor  (BRILINTA ) 90 MG TABS tablet Take 1 tablet (90 mg total) by mouth 2 (two) times daily.   traMADol (ULTRAM) 50 MG tablet Take 50 mg by mouth 3 (three) times daily as needed for moderate pain (pain score 4-6).   [DISCONTINUED] furosemide  (LASIX ) 20 MG tablet Take 20 mg by mouth 2 (two) times daily.   [DISCONTINUED] furosemide  (LASIX ) 40 MG tablet Take 40 mg by mouth 2 (two) times daily as needed for fluid.

## 2024-09-29 NOTE — Assessment & Plan Note (Signed)
 Continue with dual antiplatelet therapy on aspirin  81 mg daily and Brilinta  90 mg twice daily. Continue rosuvastatin  40 mg once daily. Continue metoprolol  tartrate 50 mg twice a day.

## 2024-10-01 ENCOUNTER — Ambulatory Visit

## 2024-10-01 DIAGNOSIS — I5032 Chronic diastolic (congestive) heart failure: Secondary | ICD-10-CM | POA: Insufficient documentation

## 2024-10-01 DIAGNOSIS — I2510 Atherosclerotic heart disease of native coronary artery without angina pectoris: Secondary | ICD-10-CM | POA: Diagnosis present

## 2024-10-01 DIAGNOSIS — R0602 Shortness of breath: Secondary | ICD-10-CM | POA: Diagnosis present

## 2024-10-01 LAB — ECHOCARDIOGRAM LIMITED
AR max vel: 1.7 cm2
AV Area VTI: 1.74 cm2
AV Area mean vel: 1.65 cm2
AV Mean grad: 6 mmHg
AV Peak grad: 11.4 mmHg
Ao pk vel: 1.69 m/s
Area-P 1/2: 3.09 cm2
S' Lateral: 2.8 cm

## 2024-10-10 ENCOUNTER — Ambulatory Visit: Payer: Self-pay | Admitting: Internal Medicine

## 2024-10-19 ENCOUNTER — Ambulatory Visit

## 2024-10-19 VITALS — BP 128/70 | HR 64 | Ht 67.0 in | Wt 139.8 lb

## 2024-10-19 DIAGNOSIS — E7800 Pure hypercholesterolemia, unspecified: Secondary | ICD-10-CM | POA: Insufficient documentation

## 2024-10-19 DIAGNOSIS — I495 Sick sinus syndrome: Secondary | ICD-10-CM | POA: Diagnosis not present

## 2024-10-19 DIAGNOSIS — I5032 Chronic diastolic (congestive) heart failure: Secondary | ICD-10-CM | POA: Diagnosis not present

## 2024-10-19 DIAGNOSIS — I739 Peripheral vascular disease, unspecified: Secondary | ICD-10-CM | POA: Diagnosis not present

## 2024-10-19 DIAGNOSIS — I2510 Atherosclerotic heart disease of native coronary artery without angina pectoris: Secondary | ICD-10-CM | POA: Diagnosis not present

## 2024-10-19 NOTE — Patient Instructions (Signed)
Medication Instructions:  Your physician recommends that you continue on your current medications as directed. Please refer to the Current Medication list given to you today.  *If you need a refill on your cardiac medications before your next appointment, please call your pharmacy*   Lab Work: None ordered If you have labs (blood work) drawn today and your tests are completely normal, you will receive your results only by: MyChart Message (if you have MyChart) OR A paper copy in the mail If you have any lab test that is abnormal or we need to change your treatment, we will call you to review the results.   Testing/Procedures: None ordered   Follow-Up: At [facility], you and your health needs are our priority.  As part of our continuing mission to provide you with exceptional heart care, we have created designated Provider Care Teams.  These Care Teams include your primary Cardiologist (physician) and Advanced Practice Providers (APPs -  Physician Assistants and Nurse Practitioners) who all work together to provide you with the care you need, when you need it.  We recommend signing up for the patient portal called "MyChart".  Sign up information is provided on this After Visit Summary.  MyChart is used to connect with patients for Virtual Visits (Telemedicine).  Patients are able to view lab/test results, encounter notes, upcoming appointments, etc.  Non-urgent messages can be sent to your provider as well.   To learn more about what you can do with MyChart, go to [email or web address].    Your next appointment:   3 month(s)  The format for your next appointment:   In Person  Provider:   Huntley Dec, MD    Other Instructions none  Important Information About Sugar

## 2024-10-19 NOTE — Assessment & Plan Note (Signed)
 Recent lipid panel from 09/27/2024 total cholesterol 155, triglycerides 102, HDL 50 and LDL 86. Continue rosuvastatin  40 mg once daily. Target aggressive dietary and lifestyle modification.

## 2024-10-19 NOTE — Assessment & Plan Note (Signed)
 Follow-up with device clinic as scheduled.  Last device check 09/28/2024 unremarkable.

## 2024-10-19 NOTE — Progress Notes (Signed)
 "  Cardiology Consultation:    Date:  10/19/2024   ID:  Tina George, DOB [birth year], MRN [id number]  PCP:  Jama Chow, MD  Cardiologist:  Alean SAUNDERS Murriel Holwerda, MD   Referring MD: Jama Chow, MD   No chief complaint on file.    ASSESSMENT AND PLAN:   [name] 79 year old woman with history of   CAD with recent unstable angina s/p PCI of LCx/OM1 and LVEDP elevated mildly 18 mmHg 07/30/2024, normal biventricular function on echocardiogram without significant wall motion abnormality or valve abnormalities.    Also has history of sick sinus syndrome with symptomatic bradycardia and poor heart rate response s/p permanent pacemaker implant 09/29/2023,  hypertension, hyperlipidemia, CKD stage III, smokes tobacco [quit March 2025], peripheral neuropathy, benign essential tremor, asthma/COPD, chronic venous insufficiency.  He has extensive peripheral arterial disease follows up with vascular surgeon Dr. Magda and was being planned for a aortogram with bilateral lower extremity angiogram and possible intervention on the left lower extremity, which had to be rescheduled in the setting of her unstable angina requiring PCI.  Currently being evaluated for potential intervention January 9.  Here for follow-up visit after increase diuresis for bilateral lower extremity edema at her last office visit. Repeat blood work not yet completed.  Problem List Items Addressed This Visit       Cardiovascular and Mediastinum   CAD, s/p PCI of LCX 07/30/2024 - Primary   Continue dual antiplatelet therapy with aspirin  and Brilinta .  Given her peripheral vascular disease, from vascular standpoint if she needs long-term oral anticoagulant, should be okay to switch to an oral anticoagulant such as Eliquis or Xarelto and Plavix  and discontinue aspirin  and Brilinta .  Continue rosuvastatin  40 mg once daily.       Sick sinus syndrome [facility]), symptomatic with bradycardia, s/p permanent pacemaker implant 09/29/2023    Follow-up with device clinic as scheduled.  Last device check 09/28/2024 unremarkable.      Peripheral vascular disease, unspecified   Continue management as per vascular surgeon Dr. Magda.       CHF (congestive heart failure) (HCC)   Appears compensated. Remains mildly hypervolemic. Echocardiogram 10/01/2024 shows normal biventricular function with LVEF 60 to 65% and diastolic parameters were noted to be normal.  No significant valve abnormalities.  Bilateral lower extremity edema could be related to venous insufficiency.  Continue reduce salt intake less than 2 g/day. Continue furosemide  40 mg in the morning and 20 mg in the afternoon. Have lower extremities elevated while at rest seated or supine.         Other   Hypercholesterolemia   Recent lipid panel from 09/27/2024 total cholesterol 155, triglycerides 102, HDL 50 and LDL 86. Continue rosuvastatin  40 mg once daily. Target aggressive dietary and lifestyle modification.         Return to clinic tentatively in 6 months.  History of Present Illness:    Tina George is a 79 y.o. female who is being seen today for f/u visit.  PCP is Jama Chow, MD.  Last visit with me in the office was 09/29/2024.  Here for visit by herself.  At home her daughter helps her out.    CAD with recent unstable angina s/p PCI of LCx/OM1 and LVEDP elevated mildly 18 mmHg 07/30/2024, normal biventricular function on echocardiogram without significant wall motion abnormality or valve abnormalities.    Also has history of sick sinus syndrome with symptomatic bradycardia and poor heart rate response s/p permanent pacemaker implant 09/29/2023,  hypertension, hyperlipidemia, CKD stage III, smokes tobacco [quit March 2025], peripheral neuropathy, benign essential tremor, asthma/COPD, chronic venous insufficiency.  He has extensive peripheral arterial disease follows up with vascular surgeon Dr. Magda and was being planned for a aortogram with  bilateral lower extremity angiogram and possible intervention on the left lower extremity, which had to be rescheduled in the setting of her unstable angina requiring PCI.  Currently being evaluated for potential intervention January 9. Repeat limited echocardiogram 10/01/2024 shows normal biventricular function LVEF 60 to 65% without significant wall motion abnormalities, normal diastolic function parameters, no significant valve abnormalities. Pacer check 09/28/2024 noted normal functioning device with 1 short run of atrial arrhythmia lasting for seconds and 3 short runs of NSVT.  Was dealing with symptoms of shortness of breath and bilateral lower extremity edema, Lasix  dose was increased.  Repeat blood work was requested for CBC, BMP, proBNP, magnesium  which is not yet completed.  Mentions overall bilateral lower extremity edema has improved with significant improvement in the left leg compared to the right.  Continues to have bilateral lower extremity pain with ambulation. Pending procedure with Dr. Magda this week.  Denies any chest pain. Does get out of breath after mild to moderate exertion.  Relieved with rest. Also describes an atypical sensation of shortness of breath.  She feels her breathing pauses for a second or 2 when she pays attention to her respiration.    No palpitations, lightheadedness, syncopal episodes. Uses a cane to ambulate, to assist with her balance issues.   Past Medical History:  Diagnosis Date   Anemia 09/20/2014   Asthma 05/06/2014   Bilateral leg edema 09/15/2020   Bradycardia 09/28/2023   CAD (coronary artery disease), moderate nonobstructive disease CT coronaries August 2024, mildly abnormal CT FFR very distal LAD and OM territory, medically managed 10/20/2023   Chest pain of uncertain etiology 09/15/2020   CHF (congestive heart failure) (HCC) 09/29/2024   COPD (chronic obstructive pulmonary disease) (HCC)    Critical lower limb ischemia (HCC)  01/08/2024   Daytime somnolence 09/15/2020   Dyslipidemia    GERD (gastroesophageal reflux disease)    GERD (gastroesophageal reflux disease)    High blood pressure    Hypercholesterolemia 09/20/2014   Hypokalemia    Medication management 09/15/2020   Murmur 09/15/2020   Muscle cramps    Osteoarthritis 09/20/2014   Other chest pain 09/15/2020   PAD (peripheral artery disease) 01/08/2024   Pain in both lower extremities 09/15/2020   Palpitations 08/06/2023   Peripheral vascular disease, unspecified 10/20/2023   Postural lightheadedness 08/18/2023   Precordial pain 09/15/2020   Presence of permanent cardiac pacemaker    Psoriasis    Sick sinus syndrome (HCC), symptomatic with bradycardia, s/p permanent pacemaker implant 09/29/2023 10/20/2023   Snoring 09/15/2020   SOB (shortness of breath) 09/15/2020   Symptomatic bradycardia 09/27/2023   Syncope and collapse 08/06/2023   Unstable angina (HCC) 07/29/2024   Vitamin D deficiency 09/20/2014    Past Surgical History:  Procedure Laterality Date   ABDOMINAL AORTOGRAM W/LOWER EXTREMITY Right 09/05/2023   Procedure: ABDOMINAL AORTOGRAM W/LOWER EXTREMITY;  Surgeon: Magda [name], MD;  Location: [facility] INVASIVE CV LAB;  Service: Cardiovascular;  Laterality: Right;   ABDOMINAL AORTOGRAM W/LOWER EXTREMITY N/A 12/31/2023   Procedure: ABDOMINAL AORTOGRAM W/LOWER EXTREMITY;  Surgeon: Lanis Fonda BRAVO, MD;  Location: [facility] INVASIVE CV LAB;  Service: Cardiovascular;  Laterality: N/A;   ABDOMINAL HYSTERECTOMY     APPENDECTOMY     arthroscopic shoulder surgery  BACK SURGERY     CATARACT EXTRACTION, BILATERAL     CHOLECYSTECTOMY     CORONARY STENT INTERVENTION N/A 07/30/2024   Procedure: CORONARY STENT INTERVENTION;  Surgeon: Jordan, Peter M, MD;  Location: [facility] INVASIVE CV LAB;  Service: Cardiovascular;  Laterality: N/A;   ENDARTERECTOMY FEMORAL Right 01/08/2024   Procedure: ENDARTERECTOMY,  RIGHT ILIOFEMORAL;  Surgeon: Magda [name], MD;   Location: [facility] OR;  Service: Vascular;  Laterality: Right;   FEMORAL-POPLITEAL BYPASS GRAFT Right 01/08/2024   Procedure: RIGHT FEMORAL TO BELOW KNEE POPLITEAL ARTERY BYPASS GRAFT 6mm RINGED PROPATEN GRAFT;  Surgeon: Magda [name], MD;  Location: [facility] OR;  Service: Vascular;  Laterality: Right;   LEFT HEART CATH AND CORONARY ANGIOGRAPHY N/A 07/30/2024   Procedure: LEFT HEART CATH AND CORONARY ANGIOGRAPHY;  Surgeon: Jordan, Peter M, MD;  Location: [facility] INVASIVE CV LAB;  Service: Cardiovascular;  Laterality: N/A;   LOWER EXTREMITY ANGIOGRAPHY Bilateral 12/31/2023   Procedure: Lower Extremity Angiography;  Surgeon: Lanis Fonda BRAVO, MD;  Location: [facility] INVASIVE CV LAB;  Service: Cardiovascular;  Laterality: Bilateral;   MASTECTOMY     MASTOIDECTOMY     PACEMAKER IMPLANT N/A 09/29/2023   Procedure: PACEMAKER IMPLANT - DUAL CHAMBER;  Surgeon: Waddell Danelle ORN, MD;  Location: [facility] INVASIVE CV LAB;  Service: Cardiovascular;  Laterality: N/A;   PARTIAL COLECTOMY     PERIPHERAL VASCULAR INTERVENTION Right 09/05/2023   Procedure: PERIPHERAL VASCULAR INTERVENTION;  Surgeon: Magda [name], MD;  Location: [facility] INVASIVE CV LAB;  Service: Cardiovascular;  Laterality: Right;  SFA    Current Medications: Active Medications[1]   Allergies:   Aspirin , Codeine, and Levaquin [levofloxacin in d5w]   Social History   Socioeconomic History   Marital status: Widowed    Spouse name: Not on file   Number of children: 2   Years of education: Not on file   Highest education level: Not on file  Occupational History   Not on file  Tobacco Use   Smoking status: Every Day    Current packs/day: 0.50    Types: Cigarettes   Smokeless tobacco: Never   Tobacco comments:    1-3 cigs daily, trying to completely quit  Vaping Use   Vaping status: Never Used  Substance and Sexual Activity   Alcohol use: Never   Drug use: Never   Sexual activity: Not on file  Other Topics Concern   Not on file  Social History Narrative    Not on file   Social Drivers of Health   Tobacco Use: High Risk (10/19/2024)   Patient History    Smoking Tobacco Use: Every Day    Smokeless Tobacco Use: Never    Passive Exposure: Not on file  Financial Resource Strain: Not on file  Food Insecurity: No Food Insecurity (07/30/2024)   Epic    Worried About Programme Researcher, Broadcasting/film/video in the Last Year: Never true    Ran Out of Food in the Last Year: Never true  Transportation Needs: No Transportation Needs (07/30/2024)   Epic    Lack of Transportation (Medical): No    Lack of Transportation (Non-Medical): No  Physical Activity: Not on file  Stress: Not on file  Social Connections: Patient Declined (07/30/2024)   Social Connection and Isolation Panel    Frequency of Communication with Friends and Family: Patient declined    Frequency of Social Gatherings with Friends and Family: Patient declined    Attends Religious Services: Patient declined    Active Member of Clubs or  Organizations: Patient declined    Attends Banker Meetings: Patient declined    Marital Status: Patient declined  Depression (PHQ2-9): Not on file  Alcohol Screen: Not on file  Housing: Low Risk (07/30/2024)   Epic    Unable to Pay for Housing in the Last Year: No    Number of Times Moved in the Last Year: 1    Homeless in the Last Year: No  Utilities: Not At Risk (07/30/2024)   Epic    Threatened with loss of utilities: No  Health Literacy: Not on file     Family History: The patient's family history includes Heart disease in her father; Hypertension in her father; Pancreatic cancer in her brother, mother, and sister. ROS:   Please see the history of present illness.    All 14 point review of systems negative except as described per history of present illness.  EKGs/Labs/Other Studies Reviewed:    The following studies were reviewed today:   EKG:       Recent Labs: 01/08/2024: ALT 15 07/31/2024: BUN 13; Creatinine, Ser 1.23; Hemoglobin 10.5;  Platelets 133; Potassium 3.7; Sodium 141  Recent Lipid Panel    Component Value Date/Time   CHOL 103 01/09/2024 0401   TRIG 56 01/09/2024 0401   HDL 56 01/09/2024 0401   CHOLHDL 1.8 01/09/2024 0401   VLDL 11 01/09/2024 0401   LDLCALC 36 01/09/2024 0401    Physical Exam:    VS:  BP 128/70   Pulse 64   Ht 5' 7 (1.702 m)   Wt 139 lb 12.8 oz (63.4 kg)   SpO2 97%   BMI 21.90 kg/m     Wt Readings from Last 3 Encounters:  10/19/24 139 lb 12.8 oz (63.4 kg)  09/29/24 148 lb 6 oz (67.3 kg)  08/25/24 147 lb 9.6 oz (67 kg)     GENERAL:  Well nourished, well developed in no acute distress NECK: No JVD; No carotid bruits CARDIAC: RRR, S1 and S2 present, no murmurs, no rubs, no gallops CHEST:  Clear to auscultation without rales, wheezing or rhonchi  Extremities: Bilateral pitting pedal edema worse on right compared to the left.  Significant improvement in comparison to visit 3 weeks ago at the office.  Pulses bilaterally symmetric with radial 2+ and dorsalis pedis 2+ NEUROLOGIC:  Alert and oriented x 3  Medication Adjustments/Labs and Tests Ordered: Current medicines are reviewed at length with the patient today.  Concerns regarding medicines are outlined above.  No orders of the defined types were placed in this encounter.  No orders of the defined types were placed in this encounter.   Signed, Ahaana Rochette reddy Jawaun Celmer, MD, MPH, [facility]. 10/19/2024 3:46 PM     [facility]     [1]  Current Meds  Medication Sig   acetaminophen  (TYLENOL ) 500 MG tablet Take 500-1,000 mg by mouth every 6 (six) hours as needed for mild pain (pain score 1-3), moderate pain (pain score 4-6) or headache.   albuterol  (PROVENTIL  HFA;VENTOLIN  HFA) 108 (90 Base) MCG/ACT inhaler Inhale 1-2 puffs into the lungs as needed for wheezing or shortness of breath.   ALPRAZolam  (XANAX ) 0.25 MG tablet Take 0.25 mg by mouth at bedtime.   amLODipine  (NORVASC ) 10 MG tablet Take 1 tablet (10 mg  total) by mouth daily.   aspirin  EC 81 MG tablet Take 1 tablet (81 mg total) by mouth daily. Swallow whole.   budesonide -glycopyrrolate -formoterol  (BREZTRI ) 160-9-4.8 MCG/ACT AERO inhaler Inhale 2 puffs into the lungs  2 (two) times daily.   buPROPion (WELLBUTRIN XL) 150 MG 24 hr tablet Take 150 mg by mouth every morning.   furosemide  (LASIX ) 40 MG tablet Take 40 mg (1 tablet) twice daily for 5 days then decrease to 40 mg (1 tablet) am and 20 mg (0.5 tablet) in the pm.   gabapentin  (NEURONTIN ) 300 MG capsule Take 300 mg by mouth at bedtime.   metoprolol  tartrate (LOPRESSOR ) 50 MG tablet Take 1 tablet (50 mg total) by mouth 2 (two) times daily.   Multiple Vitamin (MULTIVITAMIN WITH MINERALS) TABS tablet Take 1 tablet by mouth every evening. Multivitamin for Women   nicotine  (NICODERM CQ  - DOSED IN MG/24 HOURS) 21 mg/24hr patch Place 21 mg onto the skin daily.   nitroGLYCERIN  (NITROSTAT ) 0.4 MG SL tablet Place 1 tablet (0.4 mg total) under the tongue every 5 (five) minutes as needed for chest pain.   PARoxetine  (PAXIL ) 40 MG tablet Take 40 mg by mouth every morning.   rosuvastatin  (CRESTOR ) 40 MG tablet Take 1 tablet (40 mg total) by mouth daily.   ticagrelor  (BRILINTA ) 90 MG TABS tablet Take 1 tablet (90 mg total) by mouth 2 (two) times daily.   traMADol (ULTRAM) 50 MG tablet Take 50 mg by mouth 3 (three) times daily as needed for moderate pain (pain score 4-6).   "

## 2024-10-19 NOTE — Assessment & Plan Note (Signed)
 Continue dual antiplatelet therapy with aspirin  and Brilinta .  Given her peripheral vascular disease, from vascular standpoint if she needs long-term oral anticoagulant, should be okay to switch to an oral anticoagulant such as Eliquis or Xarelto and Plavix  and discontinue aspirin  and Brilinta .  Continue rosuvastatin  40 mg once daily.

## 2024-10-19 NOTE — Assessment & Plan Note (Signed)
 Continue management as per vascular surgeon Dr. Magda.

## 2024-10-19 NOTE — Assessment & Plan Note (Signed)
 Appears compensated. Remains mildly hypervolemic. Echocardiogram 10/01/2024 shows normal biventricular function with LVEF 60 to 65% and diastolic parameters were noted to be normal.  No significant valve abnormalities.  Bilateral lower extremity edema could be related to venous insufficiency.  Continue reduce salt intake less than 2 g/day. Continue furosemide  40 mg in the morning and 20 mg in the afternoon. Have lower extremities elevated while at rest seated or supine.

## 2024-10-20 LAB — CBC
Hematocrit: 34.7 % (ref 34.0–46.6)
Hemoglobin: 11 g/dL — ABNORMAL LOW (ref 11.1–15.9)
MCH: 30.6 pg (ref 26.6–33.0)
MCHC: 31.7 g/dL (ref 31.5–35.7)
MCV: 97 fL (ref 79–97)
Platelets: 194 x10E3/uL (ref 150–450)
RBC: 3.59 x10E6/uL — ABNORMAL LOW (ref 3.77–5.28)
RDW: 14.1 % (ref 11.7–15.4)
WBC: 7.8 x10E3/uL (ref 3.4–10.8)

## 2024-10-20 LAB — BASIC METABOLIC PANEL WITH GFR
BUN/Creatinine Ratio: 18 (ref 12–28)
BUN: 30 mg/dL — ABNORMAL HIGH (ref 8–27)
CO2: 23 mmol/L (ref 20–29)
Calcium: 9.7 mg/dL (ref 8.7–10.3)
Chloride: 102 mmol/L (ref 96–106)
Creatinine, Ser: 1.64 mg/dL — ABNORMAL HIGH (ref 0.57–1.00)
Glucose: 106 mg/dL — ABNORMAL HIGH (ref 70–99)
Potassium: 3.9 mmol/L (ref 3.5–5.2)
Sodium: 141 mmol/L (ref 134–144)
eGFR: 32 mL/min/1.73 — ABNORMAL LOW

## 2024-10-20 LAB — PRO B NATRIURETIC PEPTIDE: NT-Pro BNP: 1697 pg/mL — ABNORMAL HIGH (ref 0–738)

## 2024-10-20 LAB — MAGNESIUM: Magnesium: 2.1 mg/dL (ref 1.6–2.3)

## 2024-10-22 ENCOUNTER — Encounter (HOSPITAL_COMMUNITY)
Admission: RE | Disposition: A | Discharge status: Home / Self Care | Payer: Self-pay | Source: Home / Self Care | Attending: Vascular Surgery

## 2024-10-22 ENCOUNTER — Ambulatory Visit (HOSPITAL_COMMUNITY)
Admission: RE | Admit: 2024-10-22 | Discharge: 2024-10-22 | Disposition: A | Discharge status: Home / Self Care | Attending: Vascular Surgery | Admitting: Vascular Surgery

## 2024-10-22 ENCOUNTER — Other Ambulatory Visit: Payer: Self-pay

## 2024-10-22 DIAGNOSIS — Z7902 Long term (current) use of antithrombotics/antiplatelets: Secondary | ICD-10-CM | POA: Insufficient documentation

## 2024-10-22 DIAGNOSIS — I70213 Atherosclerosis of native arteries of extremities with intermittent claudication, bilateral legs: Secondary | ICD-10-CM | POA: Insufficient documentation

## 2024-10-22 DIAGNOSIS — F17210 Nicotine dependence, cigarettes, uncomplicated: Secondary | ICD-10-CM | POA: Diagnosis not present

## 2024-10-22 DIAGNOSIS — I25110 Atherosclerotic heart disease of native coronary artery with unstable angina pectoris: Secondary | ICD-10-CM | POA: Diagnosis not present

## 2024-10-22 DIAGNOSIS — Z79899 Other long term (current) drug therapy: Secondary | ICD-10-CM | POA: Insufficient documentation

## 2024-10-22 DIAGNOSIS — Y832 Surgical operation with anastomosis, bypass or graft as the cause of abnormal reaction of the patient, or of later complication, without mention of misadventure at the time of the procedure: Secondary | ICD-10-CM | POA: Insufficient documentation

## 2024-10-22 DIAGNOSIS — Z7901 Long term (current) use of anticoagulants: Secondary | ICD-10-CM | POA: Insufficient documentation

## 2024-10-22 DIAGNOSIS — I70221 Atherosclerosis of native arteries of extremities with rest pain, right leg: Secondary | ICD-10-CM | POA: Diagnosis present

## 2024-10-22 DIAGNOSIS — Z955 Presence of coronary angioplasty implant and graft: Secondary | ICD-10-CM | POA: Insufficient documentation

## 2024-10-22 DIAGNOSIS — I4891 Unspecified atrial fibrillation: Secondary | ICD-10-CM | POA: Diagnosis not present

## 2024-10-22 DIAGNOSIS — T82856A Stenosis of peripheral vascular stent, initial encounter: Secondary | ICD-10-CM | POA: Diagnosis not present

## 2024-10-22 HISTORY — PX: ABDOMINAL AORTOGRAM W/LOWER EXTREMITY: CATH118223

## 2024-10-22 HISTORY — PX: PERIPHERAL VASCULAR ULTRASOUND/IVUS: CATH118334

## 2024-10-22 HISTORY — PX: LOWER EXTREMITY ANGIOGRAPHY: CATH118251

## 2024-10-22 HISTORY — PX: LOWER EXTREMITY INTERVENTION: CATH118252

## 2024-10-22 LAB — POCT I-STAT, CHEM 8
BUN: 33 mg/dL — ABNORMAL HIGH (ref 8–23)
Calcium, Ion: 1.19 mmol/L (ref 1.15–1.40)
Chloride: 104 mmol/L (ref 98–111)
Creatinine, Ser: 2.1 mg/dL — ABNORMAL HIGH (ref 0.44–1.00)
Glucose, Bld: 80 mg/dL (ref 70–99)
HCT: 31 % — ABNORMAL LOW (ref 36.0–46.0)
Hemoglobin: 10.5 g/dL — ABNORMAL LOW (ref 12.0–15.0)
Potassium: 4 mmol/L (ref 3.5–5.1)
Sodium: 139 mmol/L (ref 135–145)
TCO2: 23 mmol/L (ref 22–32)

## 2024-10-22 LAB — POCT ACTIVATED CLOTTING TIME
Activated Clotting Time: 199 s
Activated Clotting Time: 174 s
Activated Clotting Time: 179 s

## 2024-10-22 MED ORDER — LABETALOL HCL 5 MG/ML IV SOLN: 10.0000 mg | INTRAVENOUS | Status: DC | PRN

## 2024-10-22 MED ORDER — TICAGRELOR 90 MG PO TABS
ORAL_TABLET | ORAL | Status: DC | PRN
  Administered 2024-10-22: 90 mg via ORAL

## 2024-10-22 MED ORDER — SODIUM CHLORIDE 0.9 % IV SOLN: 250.0000 mL | INTRAVENOUS | Status: DC | PRN

## 2024-10-22 MED ORDER — FENTANYL CITRATE (PF) 100 MCG/2ML IJ SOLN
INTRAMUSCULAR | Status: DC | PRN
  Administered 2024-10-22: 50 ug via INTRAVENOUS

## 2024-10-22 MED ORDER — HEPARIN SODIUM (PORCINE) 1000 UNIT/ML IJ SOLN
INTRAMUSCULAR | Status: DC | PRN
  Administered 2024-10-22: 7000 [IU] via INTRAVENOUS
  Administered 2024-10-22: 1000 [IU] via INTRAVENOUS

## 2024-10-22 MED ORDER — TICAGRELOR 90 MG PO TABS
ORAL_TABLET | ORAL | Status: AC
  Filled 2024-10-22: qty 1

## 2024-10-22 MED ORDER — SODIUM CHLORIDE 0.9% FLUSH: 3.0000 mL | INTRAVENOUS | Status: DC | PRN

## 2024-10-22 MED ORDER — MIDAZOLAM HCL 2 MG/2ML IJ SOLN
INTRAMUSCULAR | Status: AC
  Filled 2024-10-22: qty 2

## 2024-10-22 MED ORDER — LIDOCAINE HCL (PF) 1 % IJ SOLN
INTRAMUSCULAR | Status: DC | PRN
  Administered 2024-10-22: 15 mL

## 2024-10-22 MED ORDER — IODIXANOL 320 MG/ML IV SOLN
INTRAVENOUS | Status: DC | PRN
  Administered 2024-10-22: 45 mL via INTRA_ARTERIAL

## 2024-10-22 MED ORDER — MIDAZOLAM HCL (PF) 2 MG/2ML IJ SOLN
INTRAMUSCULAR | Status: DC | PRN
  Administered 2024-10-22: 2 mg via INTRAVENOUS

## 2024-10-22 MED ORDER — SODIUM CHLORIDE 0.9 % WEIGHT BASED INFUSION: 1.0000 mL/kg/h | INTRAVENOUS | Status: DC

## 2024-10-22 MED ORDER — HEPARIN (PORCINE) IN NACL 1000-0.9 UT/500ML-% IV SOLN
INTRAVENOUS | Status: DC | PRN
  Administered 2024-10-22: 1000 mL

## 2024-10-22 MED ORDER — FENTANYL CITRATE (PF) 100 MCG/2ML IJ SOLN
INTRAMUSCULAR | Status: AC
  Filled 2024-10-22: qty 2

## 2024-10-22 MED ORDER — LIDOCAINE HCL (PF) 1 % IJ SOLN
INTRAMUSCULAR | Status: AC
  Filled 2024-10-22: qty 30

## 2024-10-22 MED ORDER — ASPIRIN 81 MG PO CHEW
CHEWABLE_TABLET | ORAL | Status: DC | PRN
  Administered 2024-10-22: 81 mg via ORAL

## 2024-10-22 MED ORDER — SODIUM CHLORIDE 0.9 % IV SOLN
INTRAVENOUS | Status: DC | PRN
  Administered 2024-10-22: 10 mL/h via INTRAVENOUS

## 2024-10-22 MED ORDER — ACETAMINOPHEN 325 MG PO TABS
650.0000 mg | ORAL_TABLET | ORAL | Status: DC | PRN
  Administered 2024-10-22: 650 mg via ORAL
  Filled 2024-10-22: qty 2

## 2024-10-22 MED ORDER — SODIUM CHLORIDE 0.9 % IV SOLN: INTRAVENOUS | Status: DC

## 2024-10-22 MED ORDER — HYDRALAZINE HCL 20 MG/ML IJ SOLN: 5.0000 mg | INTRAMUSCULAR | Status: DC | PRN

## 2024-10-22 MED ORDER — ASPIRIN 81 MG PO CHEW
CHEWABLE_TABLET | ORAL | Status: AC
  Filled 2024-10-22: qty 1

## 2024-10-22 MED ORDER — HEPARIN SODIUM (PORCINE) 1000 UNIT/ML IJ SOLN
INTRAMUSCULAR | Status: AC
  Filled 2024-10-22: qty 10

## 2024-10-22 MED ORDER — ONDANSETRON HCL 4 MG/2ML IJ SOLN: 4.0000 mg | Freq: Four times a day (QID) | INTRAMUSCULAR | Status: DC | PRN

## 2024-10-22 MED ORDER — SODIUM CHLORIDE 0.9% FLUSH: 3.0000 mL | Freq: Two times a day (BID) | INTRAVENOUS | Status: DC

## 2024-10-22 NOTE — Progress Notes (Signed)
 Patient noted to have large bruise on mid L back pre-procedure.

## 2024-10-22 NOTE — Op Note (Signed)
 DATE OF SERVICE: 10/22/2024  PATIENT:  Tina George  79 y.o. 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 Magda, MD  ASSISTANT: none  ANESTHESIA:   local and IV sedation  ESTIMATED BLOOD LOSS: min  LOCAL MEDICATIONS USED:  LIDOCAINE    COUNTS: confirmed correct.  PATIENT DISPOSITION:  PACU - hemodynamically stable.   Delay start of Pharmacological VTE agent (>24hrs) due to surgical blood loss or risk of bleeding: no  INDICATION FOR PROCEDURE: Tina George is a 79 y.o. female with right foot ischemic rest pain. After careful discussion of risks, benefits, and alternatives the patient was offered angiogram. The patient understood and wished to proceed.  OPERATIVE FINDINGS:   Aortogram: Renal arteries Left: patent  Right: patent Infrarenal aorta: patent Common iliac arteries: Left: patent  Right: patent Internal iliac arteries: Left: patent  Right: patent External iliac arteries: Left: patent  Right: patent  Right Lower Extremity Angiogram:  Common femoral artery: patent  Profunda femoris artery: patent  Superficial femoral artery: prior stenting occluded  Femoral - below knee popliteal bypass: occluded  Popliteal artery: patent, but severely stenosed above the knee (~80%). Calcified. Better behind and below the knee with 20% stenosis  Anterior tibial artery: patent proximally; occludes near the ankle  Tibioperoneal trunk: patent  Peroneal artery: patent proximally; occludes near the  ankle  Posterior tibial artery: patent to foot  Pedal circulation: disadvantaged  Intravascular ultrasound of the right lower extremity: Mechanical compression of the existing stents at the proximal aspect near the origin of the SFA.  DESCRIPTION OF PROCEDURE: After identification of the patient in the pre-operative holding area, the patient was transferred to the operating room. The patient was positioned supine on the operating room table. Anesthesia was induced. The groins was prepped and draped in standard fashion. A surgical pause was performed confirming correct patient, procedure, and operative location.  The left groin was anesthetized with subcutaneous injection of 1% lidocaine . Using ultrasound guidance, the left common femoral artery was accessed with micropuncture technique.  Fluoroscopy was used to confirm cannulation over the femoral head. The 103F micropuncture sheath was upsized to 31F.   A Benson wire was advanced into the distal aorta. Over the wire an omni flush catheter was advanced to the level of L2. Aortogram was performed - see above for details.   The right common iliac artery was selected with an omniflush catheter and glidewire advantage guidewire. The wire was advanced into the common femoral artery. Over the wire the omni flush catheter was advanced into the external iliac artery. Selective angiography was performed - see above for details.   The decision was made to intervene. The patient was heparinized with 7000 units of heparin . The 31F sheath was exchanged for a 22F x 45cm sheath. Selective angiography of the right lower extremity performed prior to intervention.   I was able to navigate a wire through the occluded femoral-popliteal stenting.  Catheter was advanced through the stents into the true lumen of the popliteal artery and a selective angiogram performed.  Drug-coated angioplasty was performed to recanalize the  stents.  I used a 5 x 200 mm in pact balloon.   Follow-up angiogram was performed and showed residual stenosis in the popliteal artery and proximal aspect of the stenting.  Intravascular ultrasound and follow-up angiogram were performed.  This showed residual stenosis in the proximal aspect of the stenting and stenosis in the P1/P2 segment of the popliteal artery.  I extended the distal stenting with a 6 x 120 mm Eluvia stent.  This was postdilated with a 5 x 120 mm Mustang balloon.  I extended the proximal stenting with a 6 x 80 mm Eluvia stent and postdilated with a 6 x 80 mm Mustang balloon.  Follow-up angiogram showed resolution of occlusion of the SFA and popliteal artery with inline flow to the trifurcation.  The sheath was left in place to be removed in the recovery area.  Conscious sedation was administered with the use of IV fentanyl  and midazolam  under continuous physician and nurse monitoring.  Heart rate, blood pressure, and oxygen saturation were continuously monitored.  Total sedation time was 76 minutes  Upon completion of the case instrument and sharps counts were confirmed correct. The patient was transferred to the PACU in good condition. I was present for all portions of the procedure.  PLAN: Aspirin , Brilinta , statin.  Follow-up with me in 2 to 3 weeks to discuss left leg intervention.  [name]. Magda, MD [facility] Phone Number: [phone] 10/22/2024 1:51 PM

## 2024-10-22 NOTE — H&P (Signed)
 See below for H&P details. Plan angiogram with intervention.  Patient reports new, severe left leg rest pain. Will evaluate right leg today. Plan LLE angiogram in staged fashion given kidney dysfunction.  Tina George. Magda, MD FACS [facility] Phone Number: [phone] 10/22/2024 1:38 PM        CC:  F/u for surgery  HPI:  This is a 79 y.o. female who is s/p Right t iliofemoral endarterectomy and bovine pericardial patch angioplasty and right common femoral to below-knee popliteal artery bypass with 6 mm externally supported PTFE and subfascial plane 01/08/2024 for rest pain and ulceration  Pt returns today for follow up.  Pt states she is doing very well since surgery.  She states that her leg feels much better.  Her wound is healing.  She does have some swelling that improves with elevation.  She states it gets worse with prolonged standing.  She has some numbness in the right lower leg around the incision and outer leg.   She states that she is down to one cigarette every week or week and a half.  She has a goal of no more smoking next week.  She states that she has used goals to quit smoking.   Pt is on statin, plavix , and coumadin  (for afib).   06/09/24: Patient returns to clinic for surveillance of peripheral arterial disease status post right lower extremity bypass.  She is doing very well overall.  She does report some cramping at night which is bothersome to her.  She does not have the typical cramping pain with walking seen and claudication.  She has no ischemic rest pain.  She has no ulcers about her feet.  She does notice more discomfort in her left leg, when compared to her right.  We reviewed her noninvasive testing in detail.  07/20/24: Patient returns to clinic with new left leg disabling claudication and rest pain.  She is not able to tolerate her level of symptoms.  I recommended she undergo an angiogram with possible percutaneous  intervention.  She is not keen on stenting given its early failure in her right leg.  08/25/24: Patient returns to clinic for follow-up.  We had scheduled her to undergo left lower extremity angiography for rest pain, unfortunately she developed unstable angina and required percutaneous coronary intervention.  Stent was applied and dual antiplatelet therapy with Brilinta  was initiated.  The patient suffered a fairly serious right leg injury with a coat hanger that got stuck in her skin several days ago.  She is undergoing wound care for this.  Thankfully this is the leg that has been revascularized.  Her left leg is still quite bothersome to her.  She would like to start cardiac rehab, but cannot because of her left leg pain.  I counseled her that the main issue in hand is the strict requirement to keep her on Brilinta  to maintain patency of her coronary intervention.  I counseled her that though it may not be as durable as open surgery, a stenting solution would be favored in the left leg because of the requirement for continued Brilinta .  She is understanding and willing to proceed.  Allergies  Allergen Reactions   Aspirin  Shortness Of Breath, Itching, Swelling and Other (See Comments)    Tongue swelling   Codeine Shortness Of Breath, Swelling and Rash   Levaquin [Levofloxacin In D5w] Other (See Comments)    Severe yeast reaction.    Current Facility-Administered Medications  Medication Dose Route Frequency Provider Last Rate Last Admin   0.9 %  sodium chloride  infusion   Intravenous Continuous Magda Tina SAILOR, MD 100 mL/hr at 10/22/24 0857 New Bag at 10/22/24 0857   0.9 %  sodium chloride  infusion    Continuous PRN Magda Tina SAILOR, MD 10 mL/hr at 10/22/24 1001 10 mL/hr at 10/22/24 1001   aspirin  chewable tablet    PRN Magda Tina SAILOR, MD   81 mg at 10/22/24 1150   fentaNYL  (SUBLIMAZE ) injection    PRN Magda Tina SAILOR, MD   50 mcg at 10/22/24 1017   Heparin  (Porcine) in NaCl 1000-0.9  UT/500ML-% SOLN    PRN Magda Tina SAILOR, MD   1,000 mL at 10/22/24 1004   heparin  sodium (porcine) injection    PRN Magda Tina SAILOR, MD   1,000 Units at 10/22/24 1110   iodixanol  (VISIPAQUE ) 320 MG/ML injection    PRN Magda Tina SAILOR, MD   45 mL at 10/22/24 1133   lidocaine  (PF) (XYLOCAINE ) 1 % injection    PRN Magda Tina SAILOR, MD   15 mL at 10/22/24 1017   midazolam  PF (VERSED ) injection    PRN Magda Tina SAILOR, MD   2 mg at 10/22/24 1017   ticagrelor  (BRILINTA ) tablet    PRN Magda Tina SAILOR, MD   90 mg at 10/22/24 1150     ROS:  See HPI  Physical Exam:  Today's Vitals   10/22/24 1230 10/22/24 1245 10/22/24 1300 10/22/24 1315  BP: (!) 111/53 (!) 121/54 (!) 125/51 (!) 114/56  Pulse: 60 60 60 60  Resp: 17 (!) 21 (!) 21 17  Temp:      TempSrc:      SpO2: 100% 96% 99% 99%  Weight:      Height:      PainSc:         Body mass index is 21.61 kg/m.  No acute distress Regular rate and rhythm Unlabored breathing Well-healed incisions from prior bypass in the right leg Right lateral calf wound which is new.  Deep laceration which appears to be healing Dependent rubor in bilateral lower extremities Feet are warm and well-perfused   LOWER EXTREMITY DOPPLER STUDY   Patient Name:  Tina George  Date of Exam:   07/20/2024  Medical Rec #: [id number]       Accession #:    [id number]  Date of Birth: 1946-04-14       Patient Gender: F  Patient Age:   39 years  Exam Location:  [address]  Procedure:      VAS US  ABI WITH/WO TBI  Referring Phys: Tina MAGDA    ---------------------------------------------------------------------------  -----    Indications: Peripheral artery disease. Patient called reporting left toes  and              foot are turning purple and black, rest pain in left foot and               increased cramping in left calf.               Patient also reporting increased swelling.   High Risk Factors: Hypertension, hyperlipidemia, current smoker,  coronary  artery                    disease.   Other Factors: S/p Right t iliofemoral endarterectomy and bovine  pericardial                patch  angioplasty and right common femoral to below-knee                 popliteal artery bypass with 6 mm externally supported PTFE  and                subfascial plane 01/08/2024 by Dr. Magda for rest pain and                 ulceration.   Performing Technologist: [employer] BS, RVT, RDCS     Examination Guidelines: A complete evaluation includes at minimum, Doppler  waveform signals and systolic blood pressure reading at the level of  bilateral  brachial, anterior tibial, and posterior tibial arteries, when vessel  segments  are accessible. Bilateral testing is considered an integral part of a  complete  examination. Photoelectric Plethysmograph (PPG) waveforms and toe systolic  pressure readings are included as required and additional duplex testing  as  needed. Limited examinations for reoccurring indications may be performed  as  noted.     ABI Findings:  +---------+------------------+-----+--------+--------+  Right   Rt Pressure (mmHg)IndexWaveformComment   +---------+------------------+-----+--------+--------+  Brachial 164                                      +---------+------------------+-----+--------+--------+  ATA     178               0.98                   +---------+------------------+-----+--------+--------+  PTA     167               0.92                   +---------+------------------+-----+--------+--------+  Great Toe143               0.79                   +---------+------------------+-----+--------+--------+   +---------+------------------+-----+--------+-------+  Left    Lt Pressure (mmHg)IndexWaveformComment  +---------+------------------+-----+--------+-------+  Brachial 182                                      +---------+------------------+-----+--------+-------+  ATA     102               0.56                  +---------+------------------+-----+--------+-------+  PTA     107               0.59                  +---------+------------------+-----+--------+-------+  Great Toe75                0.41                  +---------+------------------+-----+--------+-------+   +-------+-----------+-----------+------------+------------+  ABI/TBIToday's ABIToday's TBIPrevious ABIPrevious TBI  +-------+-----------+-----------+------------+------------+  Right .98        .79        1.03        .71           +-------+-----------+-----------+------------+------------+  Left  .59        .41        .61         .  31           +-------+-----------+-----------+------------+------------+         Bilateral ABIs and TBIs appear essentially unchanged compared to prior  study on 06/09/24.    Summary:  Right: Resting right ankle-brachial index is within normal range. The  right toe-brachial index is normal.    Left: Resting left ankle-brachial index indicates moderate left lower  extremity arterial disease.  Left toe pressure is >60 mmHg which suggests adequate perfusion for  healing.   *See table(s) above for measurements and observations.       Electronically signed by Tina Robertson on 07/20/2024 at 10:08:07 AM.      Assessment/Plan:  This is a 79 y.o. female who is s/p: Right t iliofemoral endarterectomy and bovine pericardial patch angioplasty and right common femoral to below-knee popliteal artery bypass with 6 mm externally supported PTFE and subfascial plane 01/08/2024 for rest pain and ulceration  Recommend:  Abstinence from all tobacco products. Blood glucose control with goal A1c < 7%. Blood pressure control with goal blood pressure < 130/80 mmHg. Lipid reduction therapy with goal LDL-C < 55 mg/dL. Clopidogrel  75mg  by mouth daily. Coumadin  Atorvastatin  40-80mg  PO QD (or other high intensity statin therapy).  Plan aortogram, bilateral lower extremity angiogram, focus on left lower extremity with possible intervention.    Tina George. Robertson, MD [facility] Phone Number: [phone] 10/22/2024 1:37 PM

## 2024-10-22 NOTE — Progress Notes (Signed)
 Pt and daughter received discharge instructions, teach back performed. IV removed, no complications. Left fem site is clean dry intact, site is soft, no signs of bleeding. Pt escorted out via wheelchair to daughter's vehicle.

## 2024-10-22 NOTE — Discharge Instructions (Signed)
 Femoral Site Care This sheet gives you information about how to care for yourself after your procedure. Your health care provider may also give you more specific instructions. If you have problems or questions, contact your health care provider. What can I expect after the procedure?  After the procedure, it is common to have: Bruising that usually fades within 1-2 weeks. Tenderness at the site. Follow these instructions at home: Wound care Follow instructions from your health care provider about how to take care of your insertion site. Make sure you: Wash your hands with soap and water before you change your bandage (dressing). If soap and water are not available, use hand sanitizer. Remove your dressing as told by your health care provider. In 24 hours Do not take baths, swim, or use a hot tub until your health care provider approves. You may shower 24-48 hours after the procedure or as told by your health care provider. Gently wash the site with plain soap and water. Pat the area dry with a clean towel. Do not rub the site. This may cause bleeding. Do not apply powder or lotion to the site. Keep the site clean and dry. Check your femoral site every day for signs of infection. Check for: Redness, swelling, or pain. Fluid or blood. Warmth. Pus or a bad smell. Activity For the first 2-3 days after your procedure, or as long as directed: Avoid climbing stairs as much as possible. Do not squat. Do not lift anything that is heavier than 10 lb (4.5 kg), or the limit that you are told, until your health care provider says that it is safe. For 5 days Rest as directed. Avoid sitting for a long time without moving. Get up to take short walks every 1-2 hours. Do not drive for 24 hours if you were given a medicine to help you relax (sedative). General instructions Take over-the-counter and prescription medicines only as told by your health care provider. Keep all follow-up visits as told by  your health care provider. This is important. Contact a health care provider if you have: A fever or chills. You have redness, swelling, or pain around your insertion site. Get help right away if: The catheter insertion area swells very fast. You pass out. You suddenly start to sweat or your skin gets clammy. The catheter insertion area is bleeding, and the bleeding does not stop when you hold steady pressure on the area. The area near or just beyond the catheter insertion site becomes pale, cool, tingly, or numb. These symptoms may represent a serious problem that is an emergency. Do not wait to see if the symptoms will go away. Get medical help right away. Call your local emergency services (911 in the [place] Do not drive yourself to the hospital. Summary After the procedure, it is common to have bruising that usually fades within 1-2 weeks. Check your femoral site every day for signs of infection. Do not lift anything that is heavier than 10 lb (4.5 kg), or the limit that you are told, until your health care provider says that it is safe. This information is not intended to replace advice given to you by your health care provider. Make sure you discuss any questions you have with your health care provider. Document Revised: 10/13/2017 Document Reviewed: 10/13/2017 Elsevier Patient Education  2020 [employer].

## 2024-10-22 NOTE — Progress Notes (Signed)
 Site area: Left formal Site Prior to Removal:  Level 1 Pressure Applied For: 30 minutes Manual:   Yes Patient Status During Pull:  Stable Post Pull Site:  Level 0 Post Pull Instructions Given:  Yes with teachback Post Pull Pulses Present: Left femoral artery Dressing Applied:  Gauze and transparent dressing Bedrest begins @ 1425

## 2024-10-25 ENCOUNTER — Encounter (HOSPITAL_COMMUNITY): Payer: Self-pay | Admitting: Vascular Surgery

## 2024-10-29 ENCOUNTER — Other Ambulatory Visit (HOSPITAL_COMMUNITY): Payer: Self-pay

## 2024-12-07 ENCOUNTER — Encounter (HOSPITAL_COMMUNITY)

## 2024-12-07 ENCOUNTER — Ambulatory Visit: Admitting: Vascular Surgery
